# Patient Record
Sex: Male | Born: 1985 | Race: Black or African American | Hispanic: No | Marital: Married | Smoking: Never smoker
Health system: Southern US, Community
[De-identification: ages and names within clinical notes are randomized; demographics above are authoritative.]

## PROBLEM LIST (undated history)

## (undated) DIAGNOSIS — F419 Anxiety disorder, unspecified: Secondary | ICD-10-CM

## (undated) DIAGNOSIS — U071 COVID-19: Secondary | ICD-10-CM

## (undated) DIAGNOSIS — J9819 Other pulmonary collapse: Secondary | ICD-10-CM

## (undated) DIAGNOSIS — J45909 Unspecified asthma, uncomplicated: Secondary | ICD-10-CM

## (undated) DIAGNOSIS — K219 Gastro-esophageal reflux disease without esophagitis: Secondary | ICD-10-CM

## (undated) DIAGNOSIS — T7840XA Allergy, unspecified, initial encounter: Secondary | ICD-10-CM

## (undated) HISTORY — DX: Allergy, unspecified, initial encounter: T78.40XA

## (undated) HISTORY — DX: Anxiety disorder, unspecified: F41.9

---
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
## 2004-07-08 ENCOUNTER — Emergency Department (HOSPITAL_COMMUNITY): Admission: EM | Admit: 2004-07-08 | Discharge: 2004-07-09 | Payer: Self-pay | Admitting: Emergency Medicine

## 2004-08-31 ENCOUNTER — Emergency Department (HOSPITAL_COMMUNITY): Admission: EM | Admit: 2004-08-31 | Discharge: 2004-08-31 | Payer: Self-pay | Admitting: Emergency Medicine

## 2007-04-01 ENCOUNTER — Emergency Department (HOSPITAL_COMMUNITY): Admission: EM | Admit: 2007-04-01 | Discharge: 2007-04-01 | Payer: Self-pay | Admitting: Emergency Medicine

## 2008-08-11 ENCOUNTER — Encounter: Payer: Self-pay | Admitting: Family Medicine

## 2008-09-01 ENCOUNTER — Ambulatory Visit: Payer: Self-pay | Admitting: Family Medicine

## 2009-04-09 ENCOUNTER — Ambulatory Visit: Payer: Self-pay | Admitting: Family Medicine

## 2009-04-09 DIAGNOSIS — R300 Dysuria: Secondary | ICD-10-CM | POA: Insufficient documentation

## 2009-04-09 LAB — CONVERTED CEMR LAB
Bilirubin Urine: NEGATIVE
Glucose, Urine, Semiquant: NEGATIVE
Protein, U semiquant: NEGATIVE
Urobilinogen, UA: 0.2
WBC Urine, dipstick: NEGATIVE

## 2009-04-13 ENCOUNTER — Encounter: Payer: Self-pay | Admitting: Family Medicine

## 2009-04-13 LAB — CONVERTED CEMR LAB: Chlamydia, Swab/Urine, PCR: NEGATIVE

## 2010-05-03 ENCOUNTER — Emergency Department (HOSPITAL_COMMUNITY)
Admission: EM | Admit: 2010-05-03 | Discharge: 2010-05-03 | Payer: Self-pay | Source: Home / Self Care | Admitting: Emergency Medicine

## 2012-02-28 ENCOUNTER — Encounter (HOSPITAL_COMMUNITY): Payer: Self-pay | Admitting: Emergency Medicine

## 2012-02-28 ENCOUNTER — Emergency Department (HOSPITAL_COMMUNITY)
Admission: EM | Admit: 2012-02-28 | Discharge: 2012-02-28 | Disposition: A | Discharge status: Home / Self Care | Payer: Self-pay | Attending: Emergency Medicine | Admitting: Emergency Medicine

## 2012-02-28 ENCOUNTER — Emergency Department (HOSPITAL_COMMUNITY): Payer: Self-pay

## 2012-02-28 DIAGNOSIS — R0789 Other chest pain: Secondary | ICD-10-CM | POA: Insufficient documentation

## 2012-02-28 DIAGNOSIS — J45909 Unspecified asthma, uncomplicated: Secondary | ICD-10-CM | POA: Insufficient documentation

## 2012-02-28 DIAGNOSIS — M25519 Pain in unspecified shoulder: Secondary | ICD-10-CM | POA: Insufficient documentation

## 2012-02-28 HISTORY — DX: Unspecified asthma, uncomplicated: J45.909

## 2012-02-28 LAB — BASIC METABOLIC PANEL
BUN: 14 mg/dL (ref 6–23)
CO2: 25 mEq/L (ref 19–32)
Chloride: 99 mEq/L (ref 96–112)
Creatinine, Ser: 1.1 mg/dL (ref 0.50–1.35)
Glucose, Bld: 91 mg/dL (ref 70–99)
Potassium: 4 mEq/L (ref 3.5–5.1)

## 2012-02-28 LAB — CBC
HCT: 43.2 % (ref 39.0–52.0)
Hemoglobin: 16 g/dL (ref 13.0–17.0)
MCV: 83.1 fL (ref 78.0–100.0)
WBC: 4.4 10*3/uL (ref 4.0–10.5)

## 2012-02-28 MED ORDER — IBUPROFEN 400 MG PO TABS
600.0000 mg | ORAL_TABLET | Freq: Once | ORAL | Status: AC
  Administered 2012-02-28: 600 mg via ORAL
  Filled 2012-02-28: qty 1

## 2012-02-28 NOTE — ED Notes (Signed)
Pt. Reports having  A dull ache to his lt. Side of his chest with intermittent sharp spasms.  Pt. Also reports feeling like he can not take a full breath.

## 2012-02-28 NOTE — ED Notes (Signed)
ED MP at bedside.

## 2012-02-28 NOTE — ED Provider Notes (Signed)
Medical screening examination/treatment/procedure(s) were performed by non-physician practitioner and as supervising physician I was immediately available for consultation/collaboration.  Yesenia Locurto, MD 02/28/12 1919 

## 2012-02-28 NOTE — ED Provider Notes (Signed)
History     CSN: [id number]  Arrival date & time 02/28/12  0935   None     Chief Complaint  Patient presents with  . Chest Pain    (Consider location/radiation/quality/duration/timing/severity/associated sxs/prior treatment) Patient is a 26 y.o. male presenting with chest pain. No language interpreter was used.  Chest Pain The chest pain began 3 - 5 days ago. Chest pain occurs intermittently. The chest pain is worsening. The pain is associated with coughing. At its most intense, the pain is at 8/10. The severity of the pain is moderate. The quality of the pain is described as dull and sharp. The pain radiates to the left shoulder. Chest pain is worsened by exertion. Primary symptoms include fatigue and cough. Pertinent negatives for primary symptoms include no fever, no syncope, no shortness of breath, no palpitations, no nausea and no vomiting. He tried nothing for the symptoms. Risk factors include male gender.  Pertinent negatives for past medical history include no CAD, no COPD, no CHF, no DVT, no MI and no PE.    26 year old male coming in with intermittent left chest shoulder pain x4 days. States that the pain comes on 3/4 times a day and last about an hour. States that the pain is dull with sharp interactions. The pain is upper left chest and under her left arm and sometimes in his left shoulder. States that he did have a cold with a cough about a week ago. Also said that he works out sometimes but the last time was 2 months ago. Patient perks negative. No long trips no calf pain. The pain with palpitation. Patient does not smoke.No Family history of his grandmother having open-heart surgery in her 45s. Denies high cholesterol. Past medical history of asthma only.  Past Medical History  Diagnosis Date  . Asthma     No past surgical history on file.  No family history on file.  History  Substance Use Topics  . Smoking status: Never Smoker   . Smokeless tobacco: Not on file  .  Alcohol Use: Yes      Review of Systems  Constitutional: Positive for fatigue. Negative for fever.  HENT: Positive for rhinorrhea.   Eyes: Negative.   Respiratory: Positive for cough. Negative for shortness of breath.   Cardiovascular: Positive for chest pain. Negative for palpitations and syncope.  Gastrointestinal: Negative.  Negative for nausea and vomiting.  Neurological: Negative.   Psychiatric/Behavioral: Negative.   All other systems reviewed and are negative.    Allergies  Review of patient's allergies indicates no known allergies.  Home Medications   Current Outpatient Rx  Name Route Sig Dispense Refill  . ADULT MULTIVITAMIN W/MINERALS CH Oral Take 1 tablet by mouth daily.      BP 141/81  Pulse 95  Temp 98.2 F (36.8 C)  Resp 16  SpO2 100%  Physical Exam  Nursing note and vitals reviewed. Constitutional: He is oriented to person, place, and time. He appears well-developed and well-nourished.  HENT:  Head: Normocephalic.  Eyes: Conjunctivae normal and EOM are normal. Pupils are equal, round, and reactive to light.  Neck: Normal range of motion. Neck supple.  Cardiovascular: Normal rate.   Pulmonary/Chest: Effort normal and breath sounds normal. No respiratory distress.  Abdominal: Soft. Bowel sounds are normal.  Musculoskeletal: Normal range of motion.  Neurological: He is alert and oriented to person, place, and time.  Skin: Skin is warm and dry.  Psychiatric: He has a normal mood and affect.  ED Course  Procedures (including critical care time) Patient will move to CDU. Report giving to [facility]. No acute distress. No chest pain presently.  Patient will be ruled out for MI in the CDU unit. We will treat with anti-inflammatories if he rules out.    Labs Reviewed  POCT I-STAT TROPONIN I  CBC  BASIC METABOLIC PANEL   No results found.   No diagnosis found.    MDM  26 year old with atypical left chest pain intermittent x4 days. Will move  to CDU awaiting results to rule out MI.  Patient perked negative.         Remi Haggard, NP 02/28/12 1033   Date: 02/28/2012  Rate: 92  Rhythm: normal sinus rhythm  QRS Axis: normal  Intervals: normal  ST/T Wave abnormalities: normal  Conduction Disutrbances:none  Narrative Interpretation:   Old EKG Reviewed: none available    Remi Haggard, NP 02/28/12 1707

## 2012-02-28 NOTE — ED Provider Notes (Signed)
Medical screening examination/treatment/procedure(s) were performed by non-physician practitioner and as supervising physician I was immediately available for consultation/collaboration.  Derwood Kaplan, MD 02/28/12 1919

## 2012-02-28 NOTE — ED Provider Notes (Signed)
Patient placed in the CDU for cardiac work-up to be completed. PERC negative. Troponin is negative and EKG is normal. pts pain is most likely musculoskeletal.  Will dc with Ibuprofen and PCP follow-up.  Dx: musculoskeletal chest pains  Dorthula Matas, PA 02/28/12 1142

## 2012-02-28 NOTE — ED Notes (Signed)
Cp x 2 days ago sharp and stabbing and persistant than dull he states  No n/v/cough pain is in center of chest and under left arm

## 2014-08-13 ENCOUNTER — Emergency Department (HOSPITAL_COMMUNITY): Payer: Self-pay

## 2014-08-13 ENCOUNTER — Emergency Department (HOSPITAL_COMMUNITY)
Admission: EM | Admit: 2014-08-13 | Discharge: 2014-08-13 | Disposition: A | Discharge status: Home / Self Care | Payer: Self-pay | Attending: Emergency Medicine | Admitting: Emergency Medicine

## 2014-08-13 ENCOUNTER — Encounter (HOSPITAL_COMMUNITY): Payer: Self-pay | Admitting: Emergency Medicine

## 2014-08-13 DIAGNOSIS — R079 Chest pain, unspecified: Secondary | ICD-10-CM

## 2014-08-13 DIAGNOSIS — J45901 Unspecified asthma with (acute) exacerbation: Secondary | ICD-10-CM | POA: Insufficient documentation

## 2014-08-13 DIAGNOSIS — R42 Dizziness and giddiness: Secondary | ICD-10-CM | POA: Insufficient documentation

## 2014-08-13 DIAGNOSIS — R61 Generalized hyperhidrosis: Secondary | ICD-10-CM | POA: Insufficient documentation

## 2014-08-13 DIAGNOSIS — R0789 Other chest pain: Secondary | ICD-10-CM | POA: Insufficient documentation

## 2014-08-13 DIAGNOSIS — R531 Weakness: Secondary | ICD-10-CM | POA: Insufficient documentation

## 2014-08-13 LAB — BASIC METABOLIC PANEL
Anion gap: 7 (ref 5–15)
BUN: 12 mg/dL (ref 6–23)
CALCIUM: 9 mg/dL (ref 8.4–10.5)
CO2: 27 mmol/L (ref 19–32)
CREATININE: 1.17 mg/dL (ref 0.50–1.35)
Chloride: 104 mmol/L (ref 96–112)
GFR calc Af Amer: >90 mL/min (ref >90)
GFR calc non Af Amer: 83 mL/min — ABNORMAL LOW (ref >90)
GLUCOSE: 84 mg/dL (ref 70–99)
Potassium: 4.1 mmol/L (ref 3.5–5.1)
SODIUM: 138 mmol/L (ref 135–145)

## 2014-08-13 LAB — URINALYSIS, ROUTINE W REFLEX MICROSCOPIC
BILIRUBIN URINE: NEGATIVE
GLUCOSE, UA: NEGATIVE mg/dL
HGB URINE DIPSTICK: NEGATIVE
KETONES UR: NEGATIVE mg/dL
Leukocytes, UA: NEGATIVE
Nitrite: NEGATIVE
PROTEIN: NEGATIVE mg/dL
Specific Gravity, Urine: 1.008 (ref 1.005–1.030)
UROBILINOGEN UA: 0.2 mg/dL (ref 0.0–1.0)
pH: 6.5 (ref 5.0–8.0)

## 2014-08-13 LAB — I-STAT TROPONIN, ED
TROPONIN I, POC: 0 ng/mL (ref 0.00–0.08)
Troponin i, poc: 0 ng/mL (ref 0.00–0.08)

## 2014-08-13 LAB — D-DIMER, QUANTITATIVE (NOT AT ARMC): D DIMER QUANT: 0.27 ug{FEU}/mL (ref 0.00–0.48)

## 2014-08-13 LAB — CBC
HCT: 42.9 % (ref 39.0–52.0)
Hemoglobin: 15.1 g/dL (ref 13.0–17.0)
MCH: 30.7 pg (ref 26.0–34.0)
MCHC: 35.2 g/dL (ref 30.0–36.0)
MCV: 87.2 fL (ref 78.0–100.0)
PLATELETS: 215 10*3/uL (ref 150–400)
RBC: 4.92 MIL/uL (ref 4.22–5.81)
RDW: 11.7 % (ref 11.5–15.5)
WBC: 4.3 10*3/uL (ref 4.0–10.5)

## 2014-08-13 NOTE — ED Notes (Signed)
[facility] [employer], CP, weakness pta, no EKG changes, 18g LAC, VSS, A/O X4, ambulatory and in NAD

## 2014-08-13 NOTE — Discharge Instructions (Signed)
Chest Pain (Nonspecific) °It is often hard to give a specific diagnosis for the cause of chest pain. There is always a chance that your pain could be related to something serious, such as a heart attack or a blood clot in the lungs. You need to follow up with your health care provider for further evaluation. °CAUSES  °· Heartburn. °· Pneumonia or bronchitis. °· Anxiety or stress. °· Inflammation around your heart (pericarditis) or lung (pleuritis or pleurisy). °· A blood clot in the lung. °· A collapsed lung (pneumothorax). It can develop suddenly on its own (spontaneous pneumothorax) or from trauma to the chest. °· Shingles infection (herpes zoster virus). °The chest wall is composed of bones, muscles, and cartilage. Any of these can be the source of the pain. °· The bones can be bruised by injury. °· The muscles or cartilage can be strained by coughing or overwork. °· The cartilage can be affected by inflammation and become sore (costochondritis). °DIAGNOSIS  °Lab tests or other studies may be needed to find the cause of your pain. Your health care provider may have you take a test called an ambulatory electrocardiogram (ECG). An ECG records your heartbeat patterns over a 24-hour period. You may also have other tests, such as: °· Transthoracic echocardiogram (TTE). During echocardiography, sound waves are used to evaluate how blood flows through your heart. °· Transesophageal echocardiogram (TEE). °· Cardiac monitoring. This allows your health care provider to monitor your heart rate and rhythm in real time. °· Holter monitor. This is a portable device that records your heartbeat and can help diagnose heart arrhythmias. It allows your health care provider to track your heart activity for several days, if needed. °· Stress tests by exercise or by giving medicine that makes the heart beat faster. °TREATMENT  °· Treatment depends on what may be causing your chest pain. Treatment may include: °¨ Acid blockers for  heartburn. °¨ Anti-inflammatory medicine. °¨ Pain medicine for inflammatory conditions. °¨ Antibiotics if an infection is present. °· You may be advised to change lifestyle habits. This includes stopping smoking and avoiding alcohol, caffeine, and chocolate. °· You may be advised to keep your head raised (elevated) when sleeping. This reduces the chance of acid going backward from your stomach into your esophagus. °Most of the time, nonspecific chest pain will improve within 2-3 days with rest and mild pain medicine.  °HOME CARE INSTRUCTIONS  °· If antibiotics were prescribed, take them as directed. Finish them even if you start to feel better. °· For the next few days, avoid physical activities that bring on chest pain. Continue physical activities as directed. °· Do not use any tobacco products, including cigarettes, chewing tobacco, or electronic cigarettes. °· Avoid drinking alcohol. °· Only take medicine as directed by your health care provider. °· Follow your health care provider's suggestions for further testing if your chest pain does not go away. °· Keep any follow-up appointments you made. If you do not go to an appointment, you could develop lasting (chronic) problems with pain. If there is any problem keeping an appointment, call to reschedule. °SEEK MEDICAL CARE IF:  °· Your chest pain does not go away, even after treatment. °· You have a rash with blisters on your chest. °· You have a fever. °SEEK IMMEDIATE MEDICAL CARE IF:  °· You have increased chest pain or pain that spreads to your arm, neck, jaw, back, or abdomen. °· You have shortness of breath. °· You have an increasing cough, or you cough   up blood. °· You have severe back or abdominal pain. °· You feel nauseous or vomit. °· You have severe weakness. °· You faint. °· You have chills. °This is an emergency. Do not wait to see if the pain will go away. Get medical help at once. Call your local emergency services (911 in [place] Do not drive  yourself to the hospital. °MAKE SURE YOU:  °· Understand these instructions. °· Will watch your condition. °· Will get help right away if you are not doing well or get worse. °Document Released: 02/15/2005 Document Revised: 05/13/2013 Document Reviewed: 12/12/2007 °ExitCare® Patient Information ©2015 [employer]. This information is not intended to replace advice given to you by your health care provider. Make sure you discuss any questions you have with your health care provider. ° ° °Emergency Department Resource Guide °1) Find a Doctor and Pay Out of Pocket °Although you won't have to find out who is covered by your insurance plan, it is a good idea to ask around and get recommendations. You will then need to call the office and see if the doctor you have chosen will accept you as a new patient and what types of options they offer for patients who are self-pay. Some doctors offer discounts or will set up payment plans for their patients who do not have insurance, but you will need to ask so you aren't surprised when you get to your appointment. ° °2) Contact Your Local Health Department °Not all health departments have doctors that can see patients for sick visits, but many do, so it is worth a call to see if yours does. If you don't know where your local health department is, you can check in your phone book. The CDC also has a tool to help you locate your state's health department, and many state websites also have listings of all of their local health departments. ° °3) Find a Walk-in Clinic °If your illness is not likely to be very severe or complicated, you may want to try a walk in clinic. These are popping up all over the country in pharmacies, drugstores, and shopping centers. They're usually staffed by nurse practitioners or physician assistants that have been trained to treat common illnesses and complaints. They're usually fairly quick and inexpensive. However, if you have serious medical issues or  chronic medical problems, these are probably not your best option. ° °No Primary Care Doctor: °- Call Health Connect at  832-8000 - they can help you locate a primary care doctor that  accepts your insurance, provides certain services, etc. °- Physician Referral Service- 1-800-533-3463 ° °Chronic Pain Problems: °Organization         Address  Phone   Notes  °[facility]  (336) 297-2271 Patients need to be referred by their primary care doctor.  ° °Medication Assistance: °Organization         Address  Phone   Notes  °[place] [facility] [address] °[place], [place] [address] (336) 641-8030 --Must be a resident of [place] °-- Must have NO insurance coverage whatsoever (no Medicaid/ Medicare, etc.) °-- The pt. MUST have a primary care doctor that directs their care regularly and follows them in the community °  °[facility]  (866) 331-1348   °[employer]  (888) 892-1162   ° °Agencies that provide inexpensive medical care: °Organization         Address  Phone   Notes  °[facility]  (  336) 832-8035   °[facility]    (336) 832-7272   °[facility] [address] °[place], [place] [address] (336) 832-4777   °[facility] [address], °[place] (336) 271-4999   °[facility]    (336) 373-0678   °[facility]    (336) 272-1050   °[facility] ° [address], [place] Phone:  (336) 832-4444, Fax:  (336) 832-4440 Hours of Operation:  9 am - 6 pm, M-F.  Also accepts Medicaid/Medicare and self-pay.  °[facility] ° [address], [place] Phone: (336) 832-3150, Fax: (336) [phone]. Hours of Operation:  8:30 am - 5:30 pm, M-F.  Also accepts Medicaid and self-pay.  °[facility] [place] [address], [place] Phone: (336) 878-6027   °[facility] [address], [place], [place]  (336)723-1848, Ext. 123 Mondays & Thursdays: 7-9 AM.  First 15 patients are seen on a first come, first serve basis. °  ° °Medicaid-accepting [place] Providers: ° °Organization         Address  Phone   Notes  °[facility] [address], [place] (336) 641-2100 Also accepts self-pay patients.  °[facility] [address], [place] ° (336) 856-9996   °[facility] [address], [place] (336) 288-8857   °[facility] [address] [place] [address], [place] (336) 299-7000   °Veita Bland [address], [place]  ° (336) 373-1557 Only accepts [place] Access Medicaid patients after they have their name applied to their card.  ° °Self-Pay (no insurance) in [place]: ° °Organization         Address  Phone   Notes  °Sickle Cell Patients, [facility] [address], [place] (336) 832-1970   °[facility] [address], [place] (336) 832-4400   °[facility] [place] ° [address], [place] (336) 992-4800   °[facility]/Dr. Osei-Bonsu ° [address] [place] [address], [place] or [address], [place] (336) 841-8500 Phone number for both [place] and [place] locations is the same.  °[facility] [address], [place] (336) 299-0000   °[facility] [address] [place] [address], [place] or [address] (336) 852-7530 °(336) 878-2260   °[facility] [address], [place] (336) 350-1642, phone; (336) 294-5005, fax Sees patients 1st and 3rd Saturday of every month.  Must not qualify for public or private insurance (i.e. Medicaid, Medicare, [place] Health Choice, Veterans' Benefits) • Household income should be no more than 200% of the poverty level •The clinic cannot treat you if you are pregnant or think you are pregnant • Sexually transmitted  diseases are not treated at the clinic.  ° ° °Dental Care: °Organization         Address  Phone  Notes  °[facility] [facility] [address], [place] (336) 641-6152 Accepts children up to age 21 who are enrolled in Medicaid or [place] Health Choice; pregnant women with a Medicaid card; and children who have applied for Medicaid or [place] Health Choice, but were declined, whose parents can pay a reduced fee at time of service.  °[facility] [place]    [address], [place] (336) 641-7733 Accepts children up to age 21 who are enrolled in Medicaid or [place] Health Choice; pregnant women with a Medicaid card; and children who have applied for Medicaid or [place] Health Choice, but were declined, whose parents can pay a reduced fee at time of service.  °[facility] ° [address], [place] (336) 641-4533 Patients are seen by appointment only. Walk-ins are not accepted. [facility] will see patients 18 years of age and older. °Monday - Tuesday (8am-5pm) °Most Wednesdays (8:30-5pm) °$30 per visit, cash only  °[facility] ° [address], [place] (336) 641-4533 Patients are seen by appointment only. Walk-ins are not accepted. [facility] will see patients 18 years of age and older. °One Wednesday Evening (Monthly: Volunteer Based).  $30 per visit, cash only  °[facility]  (919) 537-3737 for adults; Children under age 4, call Graduate Pediatric Dentistry at (919) [phone]. Children aged 4-14, please call (919) [phone] to request a pediatric application. ° Dental services are provided in all areas of dental care including fillings, crowns and bridges, complete and partial dentures, implants, gum treatment, root canals, and extractions. Preventive care is also provided. Treatment is provided to both adults and children. °Patients are selected via a  lottery and there is often a waiting list. °  °[facility] [address], °[place] ° (336) 763-8833 [email or web address] °  °[facility] [address], [place], [place] (336)723-1848, Ext. 123 Second and Fourth Thursday of each month, opens at 6:30 AM; Clinic ends at 9 AM.  Patients are seen on a first-come first-served basis, and a limited number are seen during each clinic.  ° °[facility] ° [address], [place], [place] (336) 723-7904   Eligibility Requirements °You must have lived in [place], [place], or [place] counties for at least the last three months. °  You cannot be eligible for state or federal sponsored healthcare insurance, including Veterans Administration, Medicaid, or Medicare. °  You generally cannot be eligible for healthcare insurance through your employer.  °  How to apply: °Eligibility screenings are held every Tuesday and Wednesday afternoon from 1:00 pm until 4:00 pm. You do not need an appointment for the interview!  °[facility] [address], [place], [place] 336-631-2330   °[facility]  336-342-8273   °[facility]  336-703-3100   °[facility]  336-570-6415   ° °Behavioral Health Resources in the Community: °Intensive Outpatient Programs °Organization         Address  Phone  Notes  °[facility] [address], [place], [place] 336-878-6098   °[facility] [address], [place], [place] 336-832-9800   °[facility] [address], [place], [place] ° 336-882-2125   °[facility] [address],  °[place],  1-800-853-5163 or 336-641-4981   °Substance Abuse Resources °Organization         Address  Phone  Notes  °[facility]  336-882-2125   °[facility]  336-784-9470   °[facility] [place] [facility]  336-285-9073   °[facility]  336-845-3988   °Residential &  Outpatient Substance Abuse Program  1-800-659-3381   °Psychological Services °Organization         Address  Phone  Notes  °[facility]  336- 832-9600   °  [facility]  336- 378-7881   °[facility] [address], [place] 1-800-853-5163 or 336-641-4981   ° °Mobile Crisis Teams °Organization         Address  Phone  Notes  °[facility], Mobile Crisis Care Unit  1-877-626-1772   °Assertive °[facility] ° [address] [place], [place] 336-834-9664   °Sharon DeEsch [address] °[place] 336-554-5454   ° °Self-Help/Support Groups °Organization         Address  Phone             Notes  °[facility] [place] - variety of support groups  336- 373-1402 Call for more information  °Narcotics Anonymous (NA), [facility] [address], °[place]  2 meetings at this location  ° °Residential Treatment Programs °Organization         Address  Phone  Notes  °[facility] [address],    °[place]  1-866-801-8205   °[facility] ° [address], [place], [place] 704-293-8524   °[facility] [address], [place] 336-845-3988 Admissions: 8am-3pm M-F  °[facility] [address]    °[place], [place] 336-841-1104   °[facility] [address], [place], [place] 336-379-7146   °[facility] [place] [facility] [address]  °[place], [place] 336-285-9073   °[facility] - Intensive Outpatient [address], [place], [place] 336-852-3033   °[facility] [address]  °[place], [place] 1-877-615-2722 or 336-784-9470   °[facility] [address] [place], [place] 336-227-7417 Accepts Medicaid  °[facility] [address]  ° [place] 1-800-659-3381 Substance Abuse/Addiction Treatment  ° °[place] Behavioral Health Resources °Organization          Address  Phone  Notes  °[facility]  (888) 581-9988   °Julie Brannon, PhD [address] [place], [place]   (336) 349-5553 or (336) 951-0000   °[facility]   [address] °[place], [place] (336) 349-4454   °[facility] [address], [place], [place] (336) 342-8316 Insurance/Medicaid/sponsorship through [facility]  °[facility] [address]                                    [place], [place] (336) 342-8316 Therapy/tele-psych/case  °[facility] [address]  ° [place], [place] (336) 349-2233    °Dr. Arfeen  (336) 349-4544   °[facility] [place]  [employer] [place] [facility] 1) [address], [place] °2) [address], [place] °3)  [address], [place] (336) 349-3220 °(336) 342-7768 ° °(336) 342-8140   °[place] [facility] (336) 342-1394 or (336) 342-3537 (After Hours)    ° ° ° °

## 2014-08-13 NOTE — ED Notes (Signed)
 NS given by EMS

## 2014-08-13 NOTE — ED Provider Notes (Signed)
CSN: [id number]     Arrival date & time 08/13/14  1431 History  This chart was scribed for non-physician practitioner, Fayrene HelperBowie Thresea Doble, PA-C working with Lorre NickAnthony Allen, MD by Angelene GiovanniEmmanuella Mensah, ED Scribe. The patient was seen in room TR09C/TR09C and the patient's care was started at 5:41 PM    Chief Complaint  Patient presents with  . Chest Pain   The history is provided by the patient. No language interpreter was used.   HPI Comments: Timothy Horn is a 29 y.o. male with a hx of asthma who presents to the Emergency Department complaining of sharp pleuritic CP in the mid chest, acute onset PTA. 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 He reports associated lightheadedness, weakness, diaphoresis, and SOB. He denies coughing of blood, abdominal pain, or N/V/D. He reports that he goes to the gym regularly but he has not been lifting anything heavy or out of the ordinary. He denies any current pain. He denies being a smoker but reports being an occasional drinker. He denies a family hx of any heart problems. He denies a hx of PE or DVT. He reports that he went to [place] 2 weeks ago. He reports that he has heart burn when he eats certain foods, however this pain is different. He states that he was given an IV by EMS but no other specific treatment.  Pt wants to have his kidneys checked because he has left sided back pain which is achy and constant and he has noticed a gradually worsening onset of bubbles when he urinates. These symptoms have been ongoing for about one year.     Past Medical History  Diagnosis Date  . Asthma    History reviewed. No pertinent past surgical history. No family history on file. History  Substance Use Topics  . Smoking status: Never Smoker   . Smokeless tobacco: Not on file  . Alcohol Use: Yes    Review of Systems  Constitutional: Positive for diaphoresis. Negative  for fever and chills.  Respiratory: Positive for shortness of breath.   Cardiovascular: Positive for chest pain.  Gastrointestinal: Negative for nausea, vomiting, abdominal pain and diarrhea.  Musculoskeletal: Positive for back pain.  Neurological: Positive for weakness and light-headedness.  All other systems reviewed and are negative.     Allergies  Review of patient's allergies indicates no known allergies.  Home Medications   Prior to Admission medications   Medication Sig Start Date End Date Taking? Authorizing Provider  Multiple Vitamin (MULTIVITAMIN WITH MINERALS) TABS Take 1 tablet by mouth daily.    Historical Provider, MD   BP 122/81 mmHg  Pulse 75  Temp(Src) 98.5 F (36.9 C) (Oral)  Resp 18  Ht 5\' 9"  (1.753 m)  Wt 175 lb (79.379 kg)  BMI 25.83 kg/m2  SpO2 100% Physical Exam  Constitutional: He is oriented to person, place, and time. He appears well-developed and well-nourished. No distress.  HENT:  Head: Normocephalic and atraumatic.  Eyes: Conjunctivae and EOM are normal.  Neck: Neck supple. No tracheal deviation present.  Cardiovascular: Normal rate.   Pulmonary/Chest: Effort normal. No respiratory distress. He exhibits no crepitus.  Chest wall tenderness to palpation. No overlying skin changes. No crepitus or emphysema.   Abdominal: Soft. There is no tenderness.  Genitourinary:  Left CVA tenderness  Musculoskeletal: Normal range of motion.  BLE: no palpable cords, erythema, edema, neg Homan  sign.  Neurological: He is alert and oriented to person, place, and time.  Skin: Skin is warm and dry.  Psychiatric: He has a normal mood and affect. His behavior is normal.  Nursing note and vitals reviewed.   ED Course  Procedures (including critical care time) DIAGNOSTIC STUDIES: Oxygen Saturation is 100% on RA, normal by my interpretation.    COORDINATION OF CARE: 5:48 PM-  Will obtain D-dimer, repeat a Troponin and check UA on pt. Pt advised of plan for  treatment and pt agrees.   6:38 PM Pt with a HEART score of 1, low risk of MACE from ACS.  Also negative d-dimer, doubt PE.  Work up unremarkable, no acute emergent medical condition identified.  Pt is sxs free.  Recommend close f/u with PCP for further care.  He does have chest wall pain on exam.  Recommend NSAIDS as needed.  Strict return precaution given.   Pt also concern of kidney problem.  Normal renal function and UA unremarkable.  Pt to f/u with pcp for further care.    Labs Review Labs Reviewed  BASIC METABOLIC PANEL - Abnormal; Notable for the following:    GFR calc non Af Amer 83 (*)    All other components within normal limits  CBC  URINALYSIS, ROUTINE W REFLEX MICROSCOPIC  D-DIMER, QUANTITATIVE  I-STAT TROPOININ, ED  Rosezena Sensor, ED    Imaging Review Dg Chest 2 View  08/13/2014   CLINICAL DATA:  Chest pain, dizziness  EXAM: CHEST  2 VIEW  COMPARISON:  02/28/2012  FINDINGS: Cardiomediastinal silhouette is stable. No acute infiltrate or pleural effusion. No pulmonary edema. Bony thorax is unremarkable  IMPRESSION: No active cardiopulmonary disease.   Electronically Signed   By: Natasha Mead M.D.   On: 08/13/2014 16:00     EKG Interpretation None      Date: 08/13/2014  Rate: 69  Rhythm: normal sinus rhythm  QRS Axis: normal  Intervals: normal  ST/T Wave abnormalities: normal  Conduction Disutrbances: none  Narrative Interpretation:   Old EKG Reviewed: none for comparison EKG reviewed by me.     MDM   Final diagnoses:  Chest pain, unspecified chest pain type   BP 107/67 mmHg  Pulse 78  Temp(Src) 98.5 F (36.9 C) (Oral)  Resp 18  Ht  (1.753 m)  Wt 175 lb (79.379 kg)  BMI 25.83 kg/m2  SpO2 100%  I have reviewed nursing notes and vital signs. I personally reviewed the imaging tests through PACS system  I reviewed available ER/hospitalization records thought the EMR  I personally performed the services described in this documentation, which was  scribed in my presence. The recorded information has been reviewed and is accurate.     Fayrene Helper, PA-C 08/13/14 1844  Lorre Nick, MD 08/16/14 [phone]

## 2014-08-13 NOTE — ED Notes (Signed)
Pt A&OX4, ambulatory at d/c with steady gait, NAD 

## 2014-11-27 ENCOUNTER — Ambulatory Visit (INDEPENDENT_AMBULATORY_CARE_PROVIDER_SITE_OTHER): Payer: Self-pay | Admitting: Emergency Medicine

## 2014-11-27 VITALS — BP 106/76 | HR 100 | Temp 98.2°F | Resp 18 | Ht 68.0 in | Wt 177.2 lb

## 2014-11-27 DIAGNOSIS — F410 Panic disorder [episodic paroxysmal anxiety] without agoraphobia: Secondary | ICD-10-CM

## 2014-11-27 MED ORDER — LORAZEPAM 1 MG PO TABS
1.0000 mg | ORAL_TABLET | Freq: Three times a day (TID) | ORAL | Status: DC | PRN
Start: 2014-11-27 — End: 2015-08-13

## 2014-11-27 MED ORDER — PAROXETINE HCL 20 MG PO TABS: 20.0000 mg | ORAL_TABLET | Freq: Every day | ORAL | Status: DC

## 2014-11-27 NOTE — Progress Notes (Signed)
Subjective:  Patient ID: Timothy Horn, male    DOB: 1986/02/10  Age: 29 y.o. MRN: [id number]  CC: Panic Attack; Anxiety; Nausea; and Depression   HPI [name] presents  with anxiety and depression. He is undergoing a lot of life stresses he's recently got married he has had a child his wife is not working that is putting a unusual stress on the family. Prior to the delivery his wife was ill he was forced quit one of his 2 jobs. He's been struggling with anxiety for years and is finally gotten out of control and he is having panic attacks. He's doing what's reasonable for panic disorder and anxiety he is going to the gym he is seeking counsel from family members and is reading his Bible. They've encouraged him to come in and get medication and also to seek counseling. He denies any suicidal thoughts or thoughts of self-harm or harm to others. He is not abusing alcohol or drugs.  History Adal has a past medical history of Asthma and Anxiety.   He has no past surgical history on file.   His  family history includes Hyperlipidemia in his maternal grandmother; Mental illness in his sister.  He   reports that he has never smoked. He does not have any smokeless tobacco history on file. He reports that he drinks alcohol. His drug history is not on file.  Outpatient Prescriptions Prior to Visit  Medication Sig Dispense Refill  . Multiple Vitamin (MULTIVITAMIN WITH MINERALS) TABS Take 1 tablet by mouth daily.     No facility-administered medications prior to visit.    History   Social History  . Marital Status: Single    Spouse Name: N/A  . Number of Children: N/A  . Years of Education: N/A   Social History Main Topics  . Smoking status: Never Smoker   . Smokeless tobacco: Not on file  . Alcohol Use: Yes  . Drug Use: Not on file  . Sexual Activity: Not on file   Other Topics Concern  . None   Social History Narrative     Review of Systems  Objective:  BP  106/76 mmHg  Pulse 100  Temp(Src) 98.2 F (36.8 C) (Oral)  Resp 18  Ht  (1.727 m)  Wt 177 lb 3.2 oz (80.377 kg)  BMI 26.95 kg/m2  SpO2 99%  Physical Exam    Assessment & Plan:   Ramces was seen today for panic attack, anxiety, nausea and depression.  Diagnoses and all orders for this visit:  Panic anxiety syndrome Orders: -     Ambulatory referral to Psychology  Other orders -     PARoxetine (PAXIL) 20 MG tablet; Take 1 tablet (20 mg total) by mouth daily. -     LORazepam (ATIVAN) 1 MG tablet; Take 1 tablet (1 mg total) by mouth every 8 (eight) hours as needed for anxiety.   I am having Mr. Lech start on PARoxetine and LORazepam. I am also having him maintain his multivitamin with minerals.  Meds ordered this encounter  Medications  . PARoxetine (PAXIL) 20 MG tablet    Sig: Take 1 tablet (20 mg total) by mouth daily.    Dispense:  30 tablet    Refill:  5  . LORazepam (ATIVAN) 1 MG tablet    Sig: Take 1 tablet (1 mg total) by mouth every 8 (eight) hours as needed for anxiety.    Dispense:  90 tablet    Refill:  0  He was referred to psychologist and put on medication a follow-up month.  Appropriate red flag conditions were discussed with the patient as well as actions that should be taken.  Patient expressed his understanding.  Follow-up: Return if symptoms worsen or fail to improve.  Carmelina DaneAnderson, [place] S, MD

## 2014-11-27 NOTE — Patient Instructions (Signed)

## 2014-11-28 ENCOUNTER — Telehealth: Payer: Self-pay

## 2014-11-28 NOTE — Telephone Encounter (Signed)
Patient has questions regarding medication (Adivan,Paxil) prescribed by Dr Dareen PianoAnderson at our office yesterday. Patient feels he maybe having some side effects from them (nausea,dizzy,jitters,drymouth,loss appetite). Patients call back number is [phone]

## 2014-11-29 ENCOUNTER — Other Ambulatory Visit: Payer: Self-pay | Admitting: Emergency Medicine

## 2014-11-29 MED ORDER — SERTRALINE HCL 50 MG PO TABS
50.0000 mg | ORAL_TABLET | Freq: Every day | ORAL | Status: DC
Start: 2014-11-29 — End: 2018-07-02

## 2014-11-29 NOTE — Telephone Encounter (Signed)
Pt called again. He has been vomiting every day. Please see previous message.

## 2014-11-30 ENCOUNTER — Encounter (HOSPITAL_COMMUNITY): Payer: Self-pay

## 2014-11-30 ENCOUNTER — Emergency Department (HOSPITAL_COMMUNITY)
Admission: EM | Admit: 2014-11-30 | Discharge: 2014-11-30 | Disposition: A | Discharge status: Home / Self Care | Payer: Medicaid Other | Attending: Emergency Medicine | Admitting: Emergency Medicine

## 2014-11-30 DIAGNOSIS — R112 Nausea with vomiting, unspecified: Secondary | ICD-10-CM | POA: Diagnosis present

## 2014-11-30 DIAGNOSIS — F419 Anxiety disorder, unspecified: Secondary | ICD-10-CM | POA: Insufficient documentation

## 2014-11-30 DIAGNOSIS — J45909 Unspecified asthma, uncomplicated: Secondary | ICD-10-CM | POA: Diagnosis not present

## 2014-11-30 DIAGNOSIS — Z79899 Other long term (current) drug therapy: Secondary | ICD-10-CM | POA: Diagnosis not present

## 2014-11-30 DIAGNOSIS — Z7982 Long term (current) use of aspirin: Secondary | ICD-10-CM | POA: Insufficient documentation

## 2014-11-30 LAB — BASIC METABOLIC PANEL
ANION GAP: 12 (ref 5–15)
BUN: 9 mg/dL (ref 6–20)
CALCIUM: 9.6 mg/dL (ref 8.9–10.3)
CHLORIDE: 99 mmol/L — AB (ref 101–111)
CO2: 25 mmol/L (ref 22–32)
Creatinine, Ser: 1.04 mg/dL (ref 0.61–1.24)
GFR calc Af Amer: >60 mL/min (ref >60)
GLUCOSE: 101 mg/dL — AB (ref 65–99)
POTASSIUM: 4.1 mmol/L (ref 3.5–5.1)
Sodium: 136 mmol/L (ref 135–145)

## 2014-11-30 LAB — CBC
HEMATOCRIT: 44.3 % (ref 39.0–52.0)
Hemoglobin: 16.1 g/dL (ref 13.0–17.0)
MCH: 30.6 pg (ref 26.0–34.0)
MCHC: 36.3 g/dL — AB (ref 30.0–36.0)
MCV: 84.2 fL (ref 78.0–100.0)
PLATELETS: 270 10*3/uL (ref 150–400)
RBC: 5.26 MIL/uL (ref 4.22–5.81)
RDW: 11.5 % (ref 11.5–15.5)
WBC: 6.2 10*3/uL (ref 4.0–10.5)

## 2014-11-30 LAB — I-STAT TROPONIN, ED: Troponin i, poc: 0 ng/mL (ref 0.00–0.08)

## 2014-11-30 MED ORDER — OMEPRAZOLE 20 MG PO CPDR: 20.0000 mg | DELAYED_RELEASE_CAPSULE | Freq: Every day | ORAL | Status: DC

## 2014-11-30 MED ORDER — PROMETHAZINE HCL 25 MG/ML IJ SOLN
12.5000 mg | Freq: Once | INTRAMUSCULAR | Status: AC
  Administered 2014-11-30: 12.5 mg via INTRAVENOUS
  Filled 2014-11-30: qty 1

## 2014-11-30 MED ORDER — PROMETHAZINE HCL 25 MG PO TABS: 25.0000 mg | ORAL_TABLET | Freq: Four times a day (QID) | ORAL | Status: DC | PRN

## 2014-11-30 MED ORDER — SODIUM CHLORIDE 0.9 % IV BOLUS (SEPSIS)
1000.0000 mL | Freq: Once | INTRAVENOUS | Status: AC
  Administered 2014-11-30: 1000 mL via INTRAVENOUS

## 2014-11-30 NOTE — Progress Notes (Signed)
CM spoke with pt who [place] with [facility]rey [facility] Pt refused [facility] referral at this time   CM discussed and provided written information for uninsured accepting pcps, discussed the importance of pcp vs EDP services for f/u care, www.needymeds.org, www.goodrx.com, discounted pharmacies and other [place] resources such as [facility] , [facility], affordable care act, financial assistance, uninsured dental services, [place] med assist, DSS and  health department  Reviewed resources for [place] uninsured accepting pcps like [facility], family medicine [facility] [address], [facility], [facility], local urgent care centers, [facility], [facility] family practice, general medical clinics, [facility], [facility] urgent care plus others, medication resources, [facility] out patient pharmacies and housing Pt voiced understanding and appreciation of resources provided

## 2014-11-30 NOTE — ED Notes (Addendum)
Pt presents with c/o vomiting that has been going on since Friday. Pt reports that he has been having some issues with anxiety and panic attacks and was prescribed some medication and ever since starting the medication he has been vomiting. Pt reports he has been unable to keep anything down. Pt also c/o chest pain.

## 2014-11-30 NOTE — Discharge Instructions (Signed)

## 2014-11-30 NOTE — Telephone Encounter (Signed)
lmom to cb. 

## 2014-11-30 NOTE — ED Provider Notes (Signed)
CSN: [id number]     Arrival date & time 11/30/14  1156 History   First MD Initiated Contact with Patient 11/30/14 1500     Chief Complaint  Patient presents with  . Emesis  . Chest Pain    HPI Pt was prescribed paxil and ativan on  friday  .   He started having trouble with vomiting on Friday after starting the medicaion.   The symptoms continued throughout the weekend.  He called his doctor and had the paxil changed to zoloft.  His symptoms continue and he feels dehydrated and weak.  He vomited twice today.  He has not been able to eat or drinking anything.  He has some pain in his upper abdomen and feels like he is having acid reflux.  No fevers.  No trouble urinating. Some diarrhea for a couple of days. Past Medical History  Diagnosis Date  . Asthma   . Anxiety    History reviewed. No pertinent past surgical history. Family History  Problem Relation Age of Onset  . Mental illness Sister   . Hyperlipidemia Maternal Grandmother    History  Substance Use Topics  . Smoking status: Never Smoker   . Smokeless tobacco: Not on file  . Alcohol Use: Yes    Review of Systems  Psychiatric/Behavioral: Positive for sleep disturbance.  All other systems reviewed and are negative.     Allergies  Ativan and Paxil  Home Medications   Prior to Admission medications   Medication Sig Start Date End Date Taking? Authorizing Provider  aspirin 81 MG tablet Take 81 mg by mouth 2 (two) times daily as needed for pain.    Yes Historical Provider, MD  calcium carbonate (TUMS EX) 750 MG chewable tablet Chew 1 tablet by mouth 2 (two) times daily as needed for heartburn.   Yes Historical Provider, MD  Multiple Vitamin (MULTIVITAMIN WITH MINERALS) TABS Take 1 tablet by mouth daily.   Yes Historical Provider, MD  LORazepam (ATIVAN) 1 MG tablet Take 1 tablet (1 mg total) by mouth every 8 (eight) hours as needed for anxiety. Patient not taking: Reported on 11/30/2014 11/27/14   Carmelina Dane, MD   omeprazole (PRILOSEC) 20 MG capsule Take 1 capsule (20 mg total) by mouth daily. 11/30/14   Linwood Dibbles, MD  PARoxetine (PAXIL) 20 MG tablet Take 1 tablet (20 mg total) by mouth daily. Patient not taking: Reported on 11/30/2014 11/27/14   Carmelina Dane, MD  promethazine (PHENERGAN) 25 MG tablet Take 1 tablet (25 mg total) by mouth every 6 (six) hours as needed for nausea or vomiting. 11/30/14   Linwood Dibbles, MD  sertraline (ZOLOFT) 50 MG tablet Take 1 tablet (50 mg total) by mouth daily. Patient not taking: Reported on 11/30/2014 11/29/14   Carmelina Dane, MD   BP 128/80 mmHg  Pulse 71  Temp(Src) 97.8 F (36.6 C) (Oral)  Resp 16  SpO2 97% Physical Exam  Constitutional: He appears well-developed and well-nourished. No distress.  HENT:  Head: Normocephalic and atraumatic.  Right Ear: External ear normal.  Left Ear: External ear normal.  Eyes: Conjunctivae are normal. Right eye exhibits no discharge. Left eye exhibits no discharge. No scleral icterus.  Neck: Neck supple. No tracheal deviation present.  Cardiovascular: Normal rate, regular rhythm and intact distal pulses.   Pulmonary/Chest: Effort normal and breath sounds normal. No stridor. No respiratory distress. He has no wheezes. He has no rales.  Abdominal: Soft. Bowel sounds are normal. He exhibits no distension.  There is no tenderness. There is no rebound and no guarding.  Musculoskeletal: He exhibits no edema or tenderness.  Neurological: He is alert. He has normal strength. No cranial nerve deficit (no facial droop, extraocular movements intact, no slurred speech) or sensory deficit. He exhibits normal muscle tone. He displays no seizure activity. Coordination normal.  Skin: Skin is warm and dry. No rash noted.  Psychiatric: He has a normal mood and affect.  Nursing note and vitals reviewed.   ED Course  Procedures (including critical care time) Labs Review Labs Reviewed  BASIC METABOLIC PANEL - Abnormal; Notable for the  following:    Chloride 99 (*)    Glucose, Bld 101 (*)    All other components within normal limits  CBC - Abnormal; Notable for the following:    MCHC 36.3 (*)    All other components within normal limits  I-STAT TROPOININ, ED      EKG Interpretation   Date/Time:  Monday November 30 2014 12:44:31 EDT Ventricular Rate:  72 PR Interval:  141 QRS Duration: 68 QT Interval:  382 QTC Calculation: 418 R Axis:   77 Text Interpretation:  Sinus rhythm No significant change since last  tracing Confirmed by Makya Phillis  MD-J, Nikia Levels ([id number]) on 11/30/2014 3:05:47 PM     Medications  sodium chloride 0.9 % bolus 1,000 mL (1,000 mLs Intravenous New Bag/Given 11/30/14 1541)  promethazine (PHENERGAN) injection 12.5 mg (12.5 mg Intravenous Given 11/30/14 1540)     MDM   Final diagnoses:  Non-intractable vomiting with nausea, vomiting of unspecified type    Patient is not having any vomiting in the emergency department. His laboratory tests are unremarkable. Patient feels his symptoms are a side effect related to his new medications. That is possible versus coincidental however he was changed to a different. It appeared to be acutely dehydrated. He has no abdominal tenderness. He was given IV fluids in emergency department. Plan on discharge home with prescription for Phenergan. Follow-up with his primary care doctor.    Linwood DibblesJon Forrestine Lecrone, MD 11/30/14 [phone]

## 2014-12-02 NOTE — Telephone Encounter (Signed)
Clld pt - LMOVM of cell to cll [facility] regarding his vomiting.

## 2014-12-24 ENCOUNTER — Encounter (HOSPITAL_COMMUNITY): Payer: Self-pay | Admitting: Emergency Medicine

## 2014-12-24 ENCOUNTER — Emergency Department (HOSPITAL_COMMUNITY)
Admission: EM | Admit: 2014-12-24 | Discharge: 2014-12-24 | Disposition: A | Discharge status: Home / Self Care | Payer: Medicaid Other | Source: Home / Self Care | Attending: Family Medicine | Admitting: Family Medicine

## 2014-12-24 DIAGNOSIS — K4090 Unilateral inguinal hernia, without obstruction or gangrene, not specified as recurrent: Secondary | ICD-10-CM

## 2014-12-24 DIAGNOSIS — N5089 Other specified disorders of the male genital organs: Secondary | ICD-10-CM

## 2014-12-24 DIAGNOSIS — H8110 Benign paroxysmal vertigo, unspecified ear: Secondary | ICD-10-CM | POA: Diagnosis not present

## 2014-12-24 DIAGNOSIS — N508 Other specified disorders of male genital organs: Secondary | ICD-10-CM | POA: Diagnosis not present

## 2014-12-24 DIAGNOSIS — G44219 Episodic tension-type headache, not intractable: Secondary | ICD-10-CM | POA: Diagnosis not present

## 2014-12-24 LAB — POCT URINALYSIS DIP (DEVICE)
Bilirubin Urine: NEGATIVE
GLUCOSE, UA: NEGATIVE mg/dL
HGB URINE DIPSTICK: NEGATIVE
Ketones, ur: NEGATIVE mg/dL
Leukocytes, UA: NEGATIVE
NITRITE: NEGATIVE
PROTEIN: NEGATIVE mg/dL
Specific Gravity, Urine: 1.02 (ref 1.005–1.030)
Urobilinogen, UA: 0.2 mg/dL (ref 0.0–1.0)
pH: 7 (ref 5.0–8.0)

## 2014-12-24 MED ORDER — MECLIZINE HCL 25 MG PO TABS: 25.0000 mg | ORAL_TABLET | Freq: Three times a day (TID) | ORAL | Status: DC | PRN

## 2014-12-24 NOTE — ED Provider Notes (Signed)
CSN: [id number]     Arrival date & time 12/24/14  1653 History   First MD Initiated Contact with Patient 12/24/14 1910     Chief Complaint  Patient presents with  . Mass  . Dental Pain  . Groin Pain   (Consider location/radiation/quality/duration/timing/severity/associated sxs/prior Treatment) HPI 29 year old male with several issues:   #1. Head pressure:   Present for the past 1-2 weeks. States he has had occasional symptoms of room spinning when he stands up too quickly, turns his head.   Also describes a squeezing sensation around his head and a bandlike sensation. States he's been having more stress recently. No changes in his vision but he is worried that his vision is not good at baseline. No weakness or numbness otherwise noted. No facial droop or slurring.  #2. Inguinal swelling:   Present for past 3-4 years. Bilateral. Occasionally painful. Worse if he stands up or tries to lift something.   Eating and drinking well. No trouble with bowel movements.  #3. Testicular mass: he has noted these for the past couple weeks. He states they're bilateral. He noticed the same time on both testicles. He has had no pain. No discharge. No dysuria  Past Medical History  Diagnosis Date  . Asthma   . Anxiety    History reviewed. No pertinent past surgical history. Family History  Problem Relation Age of Onset  . Mental illness Sister   . Hyperlipidemia Maternal Grandmother    History  Substance Use Topics  . Smoking status: Never Smoker   . Smokeless tobacco: Not on file  . Alcohol Use: Yes    Review of Systems  As per HPI  Allergies  Ativan and Paxil  Home Medications   Prior to Admission medications   Medication Sig Start Date End Date Taking? Authorizing Provider  aspirin 81 MG tablet Take 81 mg by mouth 2 (two) times daily as needed for pain.     Historical Provider, MD  calcium carbonate (TUMS EX) 750 MG chewable tablet Chew 1 tablet by mouth 2 (two) times daily as needed  for heartburn.    Historical Provider, MD  LORazepam (ATIVAN) 1 MG tablet Take 1 tablet (1 mg total) by mouth every 8 (eight) hours as needed for anxiety. Patient not taking: Reported on 11/30/2014 11/27/14   Carmelina Dane, MD  meclizine (ANTIVERT) 25 MG tablet Take 1 tablet (25 mg total) by mouth 3 (three) times daily as needed for dizziness. 12/24/14   Tobey Grim, MD  Multiple Vitamin (MULTIVITAMIN WITH MINERALS) TABS Take 1 tablet by mouth daily.    Historical Provider, MD  omeprazole (PRILOSEC) 20 MG capsule Take 1 capsule (20 mg total) by mouth daily. 11/30/14   Linwood Dibbles, MD  PARoxetine (PAXIL) 20 MG tablet Take 1 tablet (20 mg total) by mouth daily. Patient not taking: Reported on 11/30/2014 11/27/14   Carmelina Dane, MD  promethazine (PHENERGAN) 25 MG tablet Take 1 tablet (25 mg total) by mouth every 6 (six) hours as needed for nausea or vomiting. 11/30/14   Linwood Dibbles, MD  sertraline (ZOLOFT) 50 MG tablet Take 1 tablet (50 mg total) by mouth daily. Patient not taking: Reported on 11/30/2014 11/29/14   Carmelina Dane, MD   BP 138/79 mmHg  Pulse 76  Temp(Src) 98.4 F (36.9 C) (Oral)  Resp 16  SpO2 97% Physical Exam  Gen:  Alert, cooperative patient who appears stated age in no acute distress.  Vital signs reviewed. HEENT:  [place]/AT.  EOMI, PERRL.  Funduscopy within normal limits bilaterally with clear cup to disc margins  MMM, tonsils non-erythematous, non-edematous.  External ears WNL, Bilateral TM's normal without retraction, redness or bulging.  Neck: No masses or thyromegaly or limitation in range of motion.  No cervical lymphadenopathy. Cardiac:  Regular rate and rhythm without murmur auscultated.  Good S1/S2. Pulm:  Clear to auscultation bilaterally with good air movement.  No wheezes or rales noted.   Abd:  Soft/nondistended/nontender.  Good bowel sounds throughout all four quadrants.  No masses noted.  GU:  Testicular appendices noted bilaterally. Neither "mass " is larger  than half centimeter in size. Nontender. I do note bilateral hernias on exam. These are currently nontender. Neuro:  No focal deficits.     ED Course  Procedures (including critical care time) Labs Review Labs Reviewed  POCT URINALYSIS DIP (DEVICE)    Imaging Review No results found.   MDM   1. Episodic tension-type headache, not intractable   2. Inguinal hernia without obstruction or gangrene, recurrence not specified, unspecified laterality   3. Testicular mass    -  For his headache and head pressure I believe this is likely secondary to stress as a tension headache. He also has history of anxiety. He has no red flags. Funduscopy is good. - he does have symptoms of BPPV. Will prescribe meclizine. - inguinal hernia noted bilaterally. He would like to discuss this with the surgeon. I discussed he will likely need to be referred by his PCP but we can get from [facility] [place] [facility] information. -  I do not note any real mass in his testicles. I do palpate testicular appendages bilaterally. If these grow in the next 4 weeks need to be seen by urologist. I did discuss this with them and expressed understanding.   Tobey Grim, MD 12/24/14 2200

## 2014-12-24 NOTE — ED Notes (Signed)
Here for multiple reason States he has an abscess in his tooth States he has lumps in bilateral testicles States he has groin pain and his bladder is swollen

## 2014-12-24 NOTE — Discharge Instructions (Signed)
We talked about several different things today: -We're headache if you start noticing worsening changes in her vision, trouble with your balance, or numbness and tingling go to the emergency room  - if you notice the bumps in your testicle starting to grow you need to come back and see [place] or go to your primary care physician to see a urologist -I have given you the information to contact [facility] much or hernia. You may need a referral from her primary care doctor to see them. - dizziness you're having is called BPPV.  Meclizine is a medicine that can help with this.   Benign Positional Vertigo Vertigo means you feel like you or your surroundings are moving when they are not. Benign positional vertigo is the most common form of vertigo. Benign means that the cause of your condition is not serious. Benign positional vertigo is more common in older adults. CAUSES  Benign positional vertigo is the result of an upset in the labyrinth system. This is an area in the middle ear that helps control your balance. This may be caused by a viral infection, head injury, or repetitive motion. However, often no specific cause is found. SYMPTOMS  Symptoms of benign positional vertigo occur when you move your head or eyes in different directions. Some of the symptoms may include:  Loss of balance and falls.  Vomiting.  Blurred vision.  Dizziness.  Nausea.  Involuntary eye movements (nystagmus). DIAGNOSIS  Benign positional vertigo is usually diagnosed by physical exam. If the specific cause of your benign positional vertigo is unknown, your caregiver may perform imaging tests, such as magnetic resonance imaging (MRI) or computed tomography (CT). TREATMENT  Your caregiver may recommend movements or procedures to correct the benign positional vertigo. Medicines such as meclizine, benzodiazepines, and medicines for nausea may be used to treat your symptoms. In rare cases, if your symptoms are  caused by certain conditions that affect the inner ear, you may need surgery. HOME CARE INSTRUCTIONS   Follow your caregiver's instructions.  Move slowly. Do not make sudden body or head movements.  Avoid driving.  Avoid operating heavy machinery.  Avoid performing any tasks that would be dangerous to you or others during a vertigo episode.  Drink enough fluids to keep your urine clear or pale yellow. SEEK IMMEDIATE MEDICAL CARE IF:   You develop problems with walking, weakness, numbness, or using your arms, hands, or legs.  You have difficulty speaking.  You develop severe headaches.  Your nausea or vomiting continues or gets worse.  You develop visual changes.  Your family or friends notice any behavioral changes.  Your condition gets worse.  You have a fever.  You develop a stiff neck or sensitivity to light. MAKE SURE YOU:   Understand these instructions.  Will watch your condition.  Will get help right away if you are not doing well or get worse. Document Released: 02/13/2006 Document Revised: 07/31/2011 Document Reviewed: 01/26/2011 [facility] Patient Information 2015 [place], [place]. This information is not intended to replace advice given to you by your health care provider. Make sure you discuss any questions you have with your health care provider.

## 2015-08-13 ENCOUNTER — Encounter (HOSPITAL_COMMUNITY): Payer: Self-pay | Admitting: Emergency Medicine

## 2015-08-13 ENCOUNTER — Emergency Department (HOSPITAL_COMMUNITY)
Admission: EM | Admit: 2015-08-13 | Discharge: 2015-08-13 | Disposition: A | Discharge status: Home / Self Care | Payer: Medicaid Other | Source: Home / Self Care | Attending: Emergency Medicine | Admitting: Emergency Medicine

## 2015-08-13 DIAGNOSIS — J069 Acute upper respiratory infection, unspecified: Secondary | ICD-10-CM

## 2015-08-13 MED ORDER — HYDROCODONE-HOMATROPINE 5-1.5 MG/5ML PO SYRP: 5.0000 mL | ORAL_SOLUTION | Freq: Four times a day (QID) | ORAL | Status: DC | PRN

## 2015-08-13 NOTE — ED Notes (Signed)
Here with URI/ Sinus/ Flu-like sx's that started 2 days ago Body aches, chills, cough mostly at night with yellow phlegm Taking Thera-Flu with some relief Post nasal drip noted as well

## 2015-08-13 NOTE — Discharge Instructions (Signed)
You have an respiratory infection. This is probably not the flu. Make sure you get plenty of rest and drink plenty of fluids. Tylenol or ibuprofen will help the best with the chills and body aches. Use the Hycodan every 4 hours as needed for cough and congestion. Start an over-the-counter allergy medicine such as Zyrtec or Allegra to help with the mucus. You should start to feel better in the next 2-4 days. Follow-up as needed.

## 2015-08-13 NOTE — ED Provider Notes (Signed)
CSN: [id number]     Arrival date & time 08/13/15  1437 History   First MD Initiated Contact with Patient 08/13/15 1608     Chief Complaint  Patient presents with  . URI  . Sinus Problem   (Consider location/radiation/quality/duration/timing/severity/associated sxs/prior Treatment) HPI  He is a 30 year old man here for evaluation of cough and chills.  His symptoms started 3 days ago with a sore throat. He took TheraFlu and things seemed to improve. The next day he developed some chills and cough. Last night, his symptoms worsened and he now reports a lot of mucus and congestion as well as cough. He does continue to have chills and some body aches. Symptoms tend to be worse at night. Has had some nausea, but no vomiting or diarrhea. No known fevers. His wife is sick with similar symptoms.  Past Medical History  Diagnosis Date  . Asthma   . Anxiety    History reviewed. No pertinent past surgical history. Family History  Problem Relation Age of Onset  . Mental illness Sister   . Hyperlipidemia Maternal Grandmother    Social History  Substance Use Topics  . Smoking status: Never Smoker   . Smokeless tobacco: None  . Alcohol Use: Yes    Review of Systems As in history of present illness Allergies  Ativan and Paxil  Home Medications   Prior to Admission medications   Medication Sig Start Date End Date Taking? Authorizing Provider  aspirin 81 MG tablet Take 81 mg by mouth 2 (two) times daily as needed for pain.     Historical Provider, MD  calcium carbonate (TUMS EX) 750 MG chewable tablet Chew 1 tablet by mouth 2 (two) times daily as needed for heartburn.    Historical Provider, MD  HYDROcodone-homatropine (HYCODAN) 5-1.5 MG/5ML syrup Take 5 mLs by mouth every 6 (six) hours as needed for cough. 08/13/15   Charm Rings, MD  meclizine (ANTIVERT) 25 MG tablet Take 1 tablet (25 mg total) by mouth 3 (three) times daily as needed for dizziness. 12/24/14   Tobey Grim, MD  Multiple  Vitamin (MULTIVITAMIN WITH MINERALS) TABS Take 1 tablet by mouth daily.    Historical Provider, MD  omeprazole (PRILOSEC) 20 MG capsule Take 1 capsule (20 mg total) by mouth daily. 11/30/14   Linwood Dibbles, MD  promethazine (PHENERGAN) 25 MG tablet Take 1 tablet (25 mg total) by mouth every 6 (six) hours as needed for nausea or vomiting. 11/30/14   Linwood Dibbles, MD   Meds Ordered and Administered this Visit  Medications - No data to display  BP 130/76 mmHg  Pulse 73  Temp(Src) 97.8 F (36.6 C) (Oral)  SpO2 100% No data found.   Physical Exam  Constitutional: He is oriented to person, place, and time. He appears well-developed and well-nourished. No distress.  HENT:  Mouth/Throat: Oropharynx is clear and moist. No oropharyngeal exudate.  Nasal mucosa is erythematous and boggy  Neck: Neck supple.  Cardiovascular: Normal rate, regular rhythm and normal heart sounds.   No murmur heard. Pulmonary/Chest: Effort normal and breath sounds normal. No respiratory distress. He has no wheezes. He has no rales.  Lymphadenopathy:    He has no cervical adenopathy.  Neurological: He is alert and oriented to person, place, and time.    ED Course  Procedures (including critical care time)  Labs Review Labs Reviewed - No data to display  Imaging Review No results found.    MDM   1. Viral URI  History more consistent with viral illness rather than flu. Symptomatic treatment with rest, fluids, Tylenol/ibuprofen. Prescription given for Hycodan to use as needed for cough. Recommended taking an OTC allergy medicine to help with the congestion and mucus.    Charm RingsErin J Ritvik Mczeal, MD 08/13/15 [phone]

## 2015-10-04 ENCOUNTER — Emergency Department (HOSPITAL_COMMUNITY): Payer: Medicaid Other

## 2015-10-04 ENCOUNTER — Emergency Department (HOSPITAL_COMMUNITY)
Admission: EM | Admit: 2015-10-04 | Discharge: 2015-10-05 | Disposition: A | Discharge status: Home / Self Care | Payer: Medicaid Other | Attending: Emergency Medicine | Admitting: Emergency Medicine

## 2015-10-04 ENCOUNTER — Encounter (HOSPITAL_COMMUNITY): Payer: Self-pay

## 2015-10-04 DIAGNOSIS — J45909 Unspecified asthma, uncomplicated: Secondary | ICD-10-CM | POA: Insufficient documentation

## 2015-10-04 DIAGNOSIS — R072 Precordial pain: Secondary | ICD-10-CM | POA: Diagnosis present

## 2015-10-04 DIAGNOSIS — Z79899 Other long term (current) drug therapy: Secondary | ICD-10-CM | POA: Insufficient documentation

## 2015-10-04 DIAGNOSIS — R42 Dizziness and giddiness: Secondary | ICD-10-CM

## 2015-10-04 DIAGNOSIS — R0789 Other chest pain: Secondary | ICD-10-CM

## 2015-10-04 LAB — CBC WITH DIFFERENTIAL/PLATELET
Basophils Absolute: 0 10*3/uL (ref 0.0–0.1)
Basophils Relative: 1 %
EOS PCT: 4 %
Eosinophils Absolute: 0.3 10*3/uL (ref 0.0–0.7)
HEMATOCRIT: 41.9 % (ref 39.0–52.0)
Hemoglobin: 15.2 g/dL (ref 13.0–17.0)
LYMPHS ABS: 2.3 10*3/uL (ref 0.7–4.0)
Lymphocytes Relative: 37 %
MCH: 31 pg (ref 26.0–34.0)
MCHC: 36.3 g/dL — ABNORMAL HIGH (ref 30.0–36.0)
MCV: 85.3 fL (ref 78.0–100.0)
MONO ABS: 0.5 10*3/uL (ref 0.1–1.0)
Monocytes Relative: 7 %
NEUTROS ABS: 3.2 10*3/uL (ref 1.7–7.7)
NEUTROS PCT: 51 %
PLATELETS: 225 10*3/uL (ref 150–400)
RBC: 4.91 MIL/uL (ref 4.22–5.81)
RDW: 12 % (ref 11.5–15.5)
WBC: 6.3 10*3/uL (ref 4.0–10.5)

## 2015-10-04 LAB — I-STAT CHEM 8, ED
BUN: 10 mg/dL (ref 6–20)
CREATININE: 0.8 mg/dL (ref 0.61–1.24)
Calcium, Ion: 1.18 mmol/L (ref 1.12–1.23)
Chloride: 99 mmol/L — ABNORMAL LOW (ref 101–111)
Glucose, Bld: 81 mg/dL (ref 65–99)
HEMATOCRIT: 46 % (ref 39.0–52.0)
Hemoglobin: 15.6 g/dL (ref 13.0–17.0)
Potassium: 3.7 mmol/L (ref 3.5–5.1)
Sodium: 139 mmol/L (ref 135–145)
TCO2: 26 mmol/L (ref 0–100)

## 2015-10-04 LAB — I-STAT TROPONIN, ED: Troponin i, poc: 0 ng/mL (ref 0.00–0.08)

## 2015-10-04 MED ORDER — GI COCKTAIL ~~LOC~~
30.0000 mL | Freq: Once | ORAL | Status: AC
Start: 2015-10-04 — End: 2015-10-04
  Administered 2015-10-04: 30 mL via ORAL

## 2015-10-04 NOTE — ED Notes (Signed)
Introduced self to pt. Pt states CP remains 5/10 at this time. Monitoring continues.

## 2015-10-04 NOTE — ED Provider Notes (Signed)
Complains of anterior chest pain for the past several months which is worse with changing positions, nonexertional. He reports today that while running hard he developed lightheadedness and shortness of breath. Lightheadedness and shortness of breath resolved after he rested. On exam patient alert no distress lungs clear auscultation heart regular rate and rhythm no murmurs or rubs chest pain is easily reproducible by forcible abduction of either shoulder. Chest pain is consistent with musculoskeletal chest pain. I feel the patient should have outpatient cardiology evaluation for exertional dyspnea and lightheadedness today. Chest x-ray viewed by me  Doug SouSam Raechel Marcos, MD 10/04/15 2315

## 2015-10-04 NOTE — ED Provider Notes (Signed)
CSN: [id number]     Arrival date & time 10/04/15  2034 History  By signing my name below, I, Linus GalasMaharshi Patel, attest that this documentation has been prepared under the direction and in the presence of Elpidio AnisShari Lelan Cush, PA-C. Electronically Signed: Linus GalasMaharshi Patel, ED Scribe. 10/04/2015. 9:58 PM.   Chief Complaint  Patient presents with  . Chest Pain  . Dizziness   The history is provided by the patient. No language interpreter was used.   HPI Comments: Timothy Horn is a 30 y.o. male  who presents to the Emergency Department complaining of intermittent dull substernal chest pain everyday for the past couple of months worsening today. Today as he was running, he began having "chest tightness" followed by feeling lightheaded, dizzy, mild productive cough, and felt as if his "throat was closing." He states this lasted for one hour after exercising and then subsided. He states his pain is worse with movement and when lying down. Pt is currently is asymptomatic in the ED. Pt denies any fevers, chills, SOB, N/V/D or any other symptoms at this time.    Past Medical History  Diagnosis Date  . Asthma   . Anxiety    History reviewed. No pertinent past surgical history. Family History  Problem Relation Age of Onset  . Mental illness Sister   . Hyperlipidemia Maternal Grandmother    Social History  Substance Use Topics  . Smoking status: Never Smoker   . Smokeless tobacco: None  . Alcohol Use: Yes    Review of Systems  Constitutional: Negative for fever and chills.  Respiratory: Positive for cough and chest tightness. Negative for shortness of breath.   Cardiovascular: Positive for chest pain.  Gastrointestinal: Negative for nausea and diarrhea.  Neurological: Positive for dizziness and light-headedness.   Allergies  Ativan and Paxil  Home Medications   Prior to Admission medications   Medication Sig Start Date End Date Taking? Authorizing Provider  loratadine-pseudoephedrine  (CLARITIN-D 24-HOUR) 10-240 MG 24 hr tablet Take 1 tablet by mouth daily as needed for allergies.    Yes Historical Provider, MD  HYDROcodone-homatropine (HYCODAN) 5-1.5 MG/5ML syrup Take 5 mLs by mouth every 6 (six) hours as needed for cough. Patient not taking: Reported on 10/04/2015 08/13/15   Charm RingsErin J Honig, MD  omeprazole (PRILOSEC) 20 MG capsule Take 1 capsule (20 mg total) by mouth daily. Patient not taking: Reported on 10/04/2015 11/30/14   Linwood DibblesJon Knapp, MD   BP 124/83 mmHg  Pulse 81  Temp(Src) 98.1 F (36.7 C) (Oral)  Resp 20  Ht 5\' 9"  (1.753 m)  Wt 175 lb (79.379 kg)  BMI 25.83 kg/m2  SpO2 100%   Physical Exam  Constitutional: He is oriented to person, place, and time. He appears well-developed and well-nourished.  HENT:  Head: Normocephalic and atraumatic.  Neck: Neck supple. No thyromegaly present.  Cardiovascular: Normal rate, regular rhythm and normal heart sounds.   No murmur heard. Pulmonary/Chest: Effort normal and breath sounds normal. He has no wheezes. He has no rales. He exhibits tenderness.  Full and clear air movements to all lung fields; chest wall is mildly TTP over the sternum; no parasternal tenderness.  Abdominal: There is no tenderness.  Abdomen is non-tender, specifically non-tender in the epigastrium.   Musculoskeletal: Normal range of motion.  Neurological: He is alert and oriented to person, place, and time.  Skin: Skin is warm and dry.  Psychiatric: He has a normal mood and affect.  Nursing note and vitals reviewed.  ED Course  Procedures   DIAGNOSTIC STUDIES: Oxygen Saturation is 100% on room air, normal by my interpretation.    COORDINATION OF CARE: 9:51 PM Will give GI cocktail. Will order CXR and EKG Discussed treatment plan with pt at bedside and pt agreed to plan.  Labs Review Labs Reviewed - No data to display  Imaging Review No results found. I have personally reviewed and evaluated these images and lab results as part of my medical  decision-making.   EKG Interpretation None      MDM   Final diagnoses:  None    1. Chest wall pain 2. Exertional dizziness  The patient is well appearing, in NAD, VSS. Chest pain is reproducible suggesting chest wall pain, but does not explain exertional dizziness. Labs, EKG, CXR reassuring. He is examined by Dr. Ethelda Chick and is felt to need further outpatient evaluation for exertional symptoms. He is considered stable for discharge home.   I personally performed the services described in this documentation, which was scribed in my presence. The recorded information has been reviewed and is accurate.  '   Elpidio Anis, PA-C 10/05/15 0014  Doug Sou, MD 10/05/15 [id number]

## 2015-10-04 NOTE — ED Notes (Signed)
Pt complains of chest pain and dizziness upon exertion today while running at the gym

## 2015-10-05 MED ORDER — IBUPROFEN 800 MG PO TABS: 800.0000 mg | ORAL_TABLET | Freq: Three times a day (TID) | ORAL | Status: DC

## 2015-10-05 NOTE — ED Notes (Signed)
Melvenia BeamShari, PA at bedside at this time.

## 2015-10-05 NOTE — Discharge Instructions (Signed)
Dizziness Dizziness is a common problem. It is a feeling of unsteadiness or light-headedness. You may feel like you are about to faint. Dizziness can lead to injury if you stumble or fall. Anyone can become dizzy, but dizziness is more common in older adults. This condition can be caused by a number of things, including medicines, dehydration, or illness. HOME CARE INSTRUCTIONS Taking these steps may help with your condition: Eating and Drinking  Drink enough fluid to keep your urine clear or pale yellow. This helps to keep you from becoming dehydrated. Try to drink more clear fluids, such as water.  Do not drink alcohol.  Limit your caffeine intake if directed by your health care provider.  Limit your salt intake if directed by your health care provider. Activity  Avoid making quick movements.  Rise slowly from chairs and steady yourself until you feel okay.  In the morning, first sit up on the side of the bed. When you feel okay, stand slowly while you hold onto something until you know that your balance is fine.  Move your legs often if you need to stand in one place for a long time. Tighten and relax your muscles in your legs while you are standing.  Do not drive or operate heavy machinery if you feel dizzy.  Avoid bending down if you feel dizzy. Place items in your home so that they are easy for you to reach without leaning over. Lifestyle  Do not use any tobacco products, including cigarettes, chewing tobacco, or electronic cigarettes. If you need help quitting, ask your health care provider.  Try to reduce your stress level, such as with yoga or meditation. Talk with your health care provider if you need help. General Instructions  Watch your dizziness for any changes.  Take medicines only as directed by your health care provider. Talk with your health care provider if you think that your dizziness is caused by a medicine that you are taking.  Tell a friend or a family  member that you are feeling dizzy. If he or she notices any changes in your behavior, have this person call your health care provider.  Keep all follow-up visits as directed by your health care provider. This is important. SEEK MEDICAL CARE IF:  Your dizziness does not go away.  Your dizziness or light-headedness gets worse.  You feel nauseous.  You have reduced hearing.  You have new symptoms.  You are unsteady on your feet or you feel like the room is spinning. SEEK IMMEDIATE MEDICAL CARE IF:  You vomit or have diarrhea and are unable to eat or drink anything.  You have problems talking, walking, swallowing, or using your arms, hands, or legs.  You feel generally weak.  You are not thinking clearly or you have trouble forming sentences. It may take a friend or family member to notice this.  You have chest pain, abdominal pain, shortness of breath, or sweating.  Your vision changes.  You notice any bleeding.  You have a headache.  You have neck pain or a stiff neck.  You have a fever.   This information is not intended to replace advice given to you by your health care provider. Make sure you discuss any questions you have with your health care provider.   Document Released: 11/01/2000 Document Revised: 09/22/2014 Document Reviewed: 05/04/2014 Elsevier Interactive Patient Education 2016 Elsevier Inc[employer] Therapy Heat therapy can help ease sore, stiff, injured, and tight muscles and joints. Heat relaxes your muscles,  which may help ease your pain.  RISKS AND COMPLICATIONS If you have any of the following conditions, do not use heat therapy unless your health care provider has approved:  Poor circulation.  Healing wounds or scarred skin in the area being treated.  Diabetes, heart disease, or high blood pressure.  Not being able to feel (numbness) the area being treated.  Unusual swelling of the area being treated.  Active infections.  Blood  clots.  Cancer.  Inability to communicate pain. This may include young children and people who have problems with their brain function (dementia).  Pregnancy. Heat therapy should only be used on old, pre-existing, or long-lasting (chronic) injuries. Do not use heat therapy on new injuries unless directed by your health care provider. HOW TO USE HEAT THERAPY There are several different kinds of heat therapy, including:  Moist heat pack.  Warm water bath.  Hot water bottle.  Electric heating pad.  Heated gel pack.  Heated wrap.  Electric heating pad. Use the heat therapy method suggested by your health care provider. Follow your health care provider's instructions on when and how to use heat therapy. GENERAL HEAT THERAPY RECOMMENDATIONS  Do not sleep while using heat therapy. Only use heat therapy while you are awake.  Your skin may turn pink while using heat therapy. Do not use heat therapy if your skin turns red.  Do not use heat therapy if you have new pain.  High heat or long exposure to heat can cause burns. Be careful when using heat therapy to avoid burning your skin.  Do not use heat therapy on areas of your skin that are already irritated, such as with a rash or sunburn. SEEK MEDICAL CARE IF:  You have blisters, redness, swelling, or numbness.  You have new pain.  Your pain is worse. MAKE SURE YOU:  Understand these instructions.  Will watch your condition.  Will get help right away if you are not doing well or get worse.   This information is not intended to replace advice given to you by your health care provider. Make sure you discuss any questions you have with your health care provider.   Document Released: 07/31/2011 Document Revised: 05/29/2014 Document Reviewed: 07/01/2013 Elsevier Interactive Patient Education 2016 Elsevier Inc. Chest Wall Pain Chest wall pain is pain in or around the bones and muscles of your chest. Sometimes, an injury causes  this pain. Sometimes, the cause may not be known. This pain may take several weeks or longer to get better. HOME CARE INSTRUCTIONS  Pay attention to any changes in your symptoms. Take these actions to help with your pain:   Rest as told by your health care provider.   Avoid activities that cause pain. These include any activities that use your chest muscles or your abdominal and side muscles to lift heavy items.   If directed, apply ice to the painful area:  Put ice in a plastic bag.  Place a towel between your skin and the bag.  Leave the ice on for 20 minutes, 2-3 times per day.  Take over-the-counter and prescription medicines only as told by your health care provider.  Do not use tobacco products, including cigarettes, chewing tobacco, and e-cigarettes. If you need help quitting, ask your health care provider.  Keep all follow-up visits as told by your health care provider. This is important. SEEK MEDICAL CARE IF:  You have a fever.  Your chest pain becomes worse.  You have new symptoms. SEEK IMMEDIATE  MEDICAL CARE IF:  You have nausea or vomiting.  You feel sweaty or light-headed.  You have a cough with phlegm (sputum) or you cough up blood.  You develop shortness of breath.   This information is not intended to replace advice given to you by your health care provider. Make sure you discuss any questions you have with your health care provider.   Document Released: 05/08/2005 Document Revised: 01/27/2015 Document Reviewed: 08/03/2014 Elsevier Interactive Patient Education [employer] Elsevier Inc[employer]

## 2015-10-05 NOTE — ED Notes (Signed)
Pt given discharge instructions, verbalized understanding, denied further questions or concerns. Pt aware of need to follow up. Pt able to ambulate to exit at this time, without difficulty, moving all extremities well.

## 2015-10-11 ENCOUNTER — Other Ambulatory Visit (HOSPITAL_COMMUNITY): Payer: Self-pay | Admitting: Family Medicine

## 2015-10-11 DIAGNOSIS — R079 Chest pain, unspecified: Secondary | ICD-10-CM

## 2015-10-28 ENCOUNTER — Ambulatory Visit (HOSPITAL_COMMUNITY): Payer: Medicaid Other | Attending: Cardiovascular Disease

## 2015-10-28 ENCOUNTER — Other Ambulatory Visit: Payer: Self-pay

## 2015-10-28 DIAGNOSIS — R079 Chest pain, unspecified: Secondary | ICD-10-CM | POA: Insufficient documentation

## 2015-10-28 LAB — ECHOCARDIOGRAM COMPLETE
CHL CUP DOP CALC LVOT VTI: 19.2 cm
CHL CUP MV DEC (S): 218
CHL CUP TV REG PEAK VELOCITY: 204 cm/s
E/e' ratio: 4.79
EWDT: 218 ms
FS: 41 % (ref 28–44)
IVS/LV PW RATIO, ED: 0.95
LA ID, A-P, ES: 34 mm
LA diam end sys: 34 mm
LA vol A4C: 27 ml
LA vol: 35 mL
LADIAMINDEX: 1.74 cm/m2
LAVOLIN: 17.9 mL/m2
LV TDI E'LATERAL: 14
LV e' LATERAL: 14 cm/s
LVEEAVG: 4.79
LVEEMED: 4.79
LVOT SV: 73 mL
LVOT area: 3.8 cm2
LVOT diameter: 22 mm
LVOT peak vel: 88 cm/s
MVPKAVEL: 46.9 m/s
MVPKEVEL: 67.1 m/s
PW: 9.87 mm — AB (ref 0.6–1.1)
TDI e' medial: 10.5
TRMAXVEL: 204 cm/s

## 2016-06-17 ENCOUNTER — Encounter (HOSPITAL_COMMUNITY): Payer: Self-pay

## 2016-06-17 ENCOUNTER — Emergency Department (HOSPITAL_COMMUNITY)
Admission: EM | Admit: 2016-06-17 | Discharge: 2016-06-17 | Disposition: A | Discharge status: Home / Self Care | Payer: Self-pay | Attending: Emergency Medicine | Admitting: Emergency Medicine

## 2016-06-17 ENCOUNTER — Emergency Department (HOSPITAL_COMMUNITY): Payer: Self-pay

## 2016-06-17 DIAGNOSIS — J40 Bronchitis, not specified as acute or chronic: Secondary | ICD-10-CM | POA: Insufficient documentation

## 2016-06-17 MED ORDER — PREDNISONE 20 MG PO TABS
60.0000 mg | ORAL_TABLET | Freq: Once | ORAL | Status: AC
  Administered 2016-06-17: 60 mg via ORAL
  Filled 2016-06-17: qty 3

## 2016-06-17 MED ORDER — ALBUTEROL SULFATE HFA 108 (90 BASE) MCG/ACT IN AERS
2.0000 | INHALATION_SPRAY | Freq: Once | RESPIRATORY_TRACT | Status: AC
  Administered 2016-06-17: 2 via RESPIRATORY_TRACT
  Filled 2016-06-17: qty 6.7

## 2016-06-17 MED ORDER — ALBUTEROL SULFATE HFA 108 (90 BASE) MCG/ACT IN AERS: 2.0000 | INHALATION_SPRAY | RESPIRATORY_TRACT | 0 refills | Status: DC | PRN

## 2016-06-17 MED ORDER — PREDNISONE 20 MG PO TABS
40.0000 mg | ORAL_TABLET | Freq: Every day | ORAL | 0 refills | Status: DC
Start: 2016-06-17 — End: 2016-12-09

## 2016-06-17 NOTE — ED Notes (Signed)
Declined W/C at D/C and was escorted to lobby by RN. 

## 2016-06-17 NOTE — ED Provider Notes (Signed)
[facility]-EMERGENCY DEPT Provider Note   CSN: [id number] Arrival date & time: 06/17/16  4098  By signing my name below, I, Bing Neighbors., attest that this documentation has been prepared under the direction and in the presence of Shon Baton, MD. Electronically signed: Bing Neighbors., ED Scribe. 06/17/16. 2:31 PM.    History   Chief Complaint Chief Complaint  Patient presents with  . Cough    HPI  Timothy Horn is a 31 y.o. male with hx of exercise-induced asthma who presents to the Emergency Department complaining of worsening mild to moderate cough with sudden onset x1 week. Pt states that he has had a worsening cough for the past week. He states that the cough has been productive and yesterday he had an episode of post tussive emesis. Pt reports sore throat, congestion and rhinorrhea. Pt has taken Robitussin, Delsym and Theraflu for the symptoms with no relief. Pt reportedly felt SOB yesterday and used a nebulizer with mild relief. Pt denies sinus pressure, fever. Of note, pt denies smoking. He states that his symptoms are worse at night and in the morning.   The history is provided by the patient. No language interpreter was used.  Cough  Associated symptoms include rhinorrhea and sore throat.    Past Medical History:  Diagnosis Date  . Anxiety   . Asthma     Patient Active Problem List   Diagnosis Date Noted  . DYSURIA 04/09/2009    History reviewed. No pertinent surgical history.     Home Medications    Prior to Admission medications   Medication Sig Start Date End Date Taking? Authorizing Provider  albuterol (PROVENTIL HFA;VENTOLIN HFA) 108 (90 Base) MCG/ACT inhaler Inhale 2 puffs into the lungs every 4 (four) hours as needed for wheezing or shortness of breath. 06/17/16   Shon Baton, MD  HYDROcodone-homatropine (HYCODAN) 5-1.5 MG/5ML syrup Take 5 mLs by mouth every 6 (six) hours as needed for cough. Patient not taking: Reported  on 10/04/2015 08/13/15   Charm Rings, MD  ibuprofen (ADVIL,MOTRIN) 800 MG tablet Take 1 tablet (800 mg total) by mouth 3 (three) times daily. 10/05/15   Elpidio Anis, PA-C  loratadine-pseudoephedrine (CLARITIN-D 24-HOUR) 10-240 MG 24 hr tablet Take 1 tablet by mouth daily as needed for allergies.     Historical Provider, MD  omeprazole (PRILOSEC) 20 MG capsule Take 1 capsule (20 mg total) by mouth daily. Patient not taking: Reported on 10/04/2015 11/30/14   Linwood Dibbles, MD  predniSONE (DELTASONE) 20 MG tablet Take 2 tablets (40 mg total) by mouth daily. 06/17/16   Shon Baton, MD    Family History Family History  Problem Relation Age of Onset  . Mental illness Sister   . Hyperlipidemia Maternal Grandmother     Social History Social History  Substance Use Topics  . Smoking status: Never Smoker  . Smokeless tobacco: Not on file  . Alcohol use Yes     Allergies   Ativan [lorazepam] and Paxil [paroxetine hcl]   Review of Systems Review of Systems  Constitutional: Negative for fever.  HENT: Positive for congestion, rhinorrhea and sore throat. Negative for sinus pressure.   Respiratory: Positive for cough.   Gastrointestinal: Negative for nausea and vomiting.  All other systems reviewed and are negative.    Physical Exam Updated Vital Signs BP 108/80 (BP Location: Right Arm)   Pulse 82   Temp 98.3 F (36.8 C) (Oral)   Resp 20   SpO2 95%  Physical Exam  Constitutional: He is oriented to person, place, and time. He appears well-developed and well-nourished. No distress.  HENT:  Head: Normocephalic and atraumatic.  Mouth/Throat: Oropharynx is clear and moist. No oropharyngeal exudate.  Usual midline, no posterior or pharyngeal erythema  Cardiovascular: Normal rate, regular rhythm and normal heart sounds.   No murmur heard. Pulmonary/Chest: Effort normal and breath sounds normal. No respiratory distress. He has no wheezes. He has no rales.  Musculoskeletal: He exhibits  no edema.  Neurological: He is alert and oriented to person, place, and time.  Skin: Skin is warm and dry.  Psychiatric: He has a normal mood and affect.  Nursing note and vitals reviewed.    ED Treatments / Results   DIAGNOSTIC STUDIES: Oxygen Saturation is 95% on RA, inadequate by my interpretation.   COORDINATION OF CARE: 2:31 PM-Discussed next steps with pt. Pt verbalized understanding and is agreeable with the plan.    Labs (all labs ordered are listed, but only abnormal results are displayed) Labs Reviewed - No data to display  EKG  EKG Interpretation None       Radiology Dg Chest 2 View  Result Date: 06/17/2016 CLINICAL DATA:  Productive cough for 10 days. EXAM: CHEST  2 VIEW COMPARISON:  10/04/2015 FINDINGS: The heart size and mediastinal contours are within normal limits. Both lungs are clear. The visualized skeletal structures are unremarkable. IMPRESSION: No active cardiopulmonary disease. Electronically Signed   By: Ted Mcalpineobrinka  Dimitrova M.D.   On: 06/17/2016 14:24    Procedures Procedures (including critical care time)  Medications Ordered in ED Medications  albuterol (PROVENTIL HFA;VENTOLIN HFA) 108 (90 Base) MCG/ACT inhaler 2 puff (2 puffs Inhalation Given 06/17/16 1339)  predniSONE (DELTASONE) tablet 60 mg (60 mg Oral Given 06/17/16 1339)     Initial Impression / Assessment and Plan / ED Course  I have reviewed the triage vital signs and the nursing notes.  Pertinent labs & imaging results that were available during my care of the patient were reviewed by me and considered in my medical decision making (see chart for details).     Patient presents with cough greater than one week. History of exercise-induced asthma. Cough worse at night. He is nontoxic. Vital signs reassuring. Exam is fairly benign. Suspect URI causing bronchitis.  Given the patient reports some improvement with nebulizer, will treat with inhaler and steroids. Discussed with patient  that I felt that a chest x-ray would be low yield given he is afebrile and his breath sounds are clear at this time. However, patient requesting x-ray to rule out pneumonia. X-ray is negative. Will discharge with plan as above.  After history, exam, and medical workup I feel the patient has been appropriately medically screened and is safe for discharge home. Pertinent diagnoses were discussed with the patient. Patient was given return precautions.   Final Clinical Impressions(s) / ED Diagnoses   Final diagnoses:  Bronchitis    New Prescriptions New Prescriptions   ALBUTEROL (PROVENTIL HFA;VENTOLIN HFA) 108 (90 BASE) MCG/ACT INHALER    Inhale 2 puffs into the lungs every 4 (four) hours as needed for wheezing or shortness of breath.   PREDNISONE (DELTASONE) 20 MG TABLET    Take 2 tablets (40 mg total) by mouth daily.   I personally performed the services described in this documentation, which was scribed in my presence. The recorded information has been reviewed and is accurate.     Shon Batonourtney F Keiry Kowal, MD 06/17/16 [phone]

## 2016-06-17 NOTE — ED Notes (Signed)
Pt too Nurse First wondering when he is going to be seen. Noticed moved Off the floor. Placed back in waiting.

## 2016-06-17 NOTE — ED Triage Notes (Signed)
Patient here with cough and cold symptoms x 1 week, cough worse at night. States drainage making his throat sore, NAD

## 2016-12-08 ENCOUNTER — Emergency Department (HOSPITAL_COMMUNITY)
Admission: EM | Admit: 2016-12-08 | Discharge: 2016-12-09 | Disposition: A | Discharge status: Home / Self Care | Payer: Medicaid Other | Attending: Emergency Medicine | Admitting: Emergency Medicine

## 2016-12-08 ENCOUNTER — Emergency Department (HOSPITAL_COMMUNITY): Payer: Medicaid Other

## 2016-12-08 ENCOUNTER — Encounter (HOSPITAL_COMMUNITY): Payer: Self-pay | Admitting: Emergency Medicine

## 2016-12-08 DIAGNOSIS — Z79899 Other long term (current) drug therapy: Secondary | ICD-10-CM | POA: Insufficient documentation

## 2016-12-08 DIAGNOSIS — R51 Headache: Secondary | ICD-10-CM | POA: Insufficient documentation

## 2016-12-08 DIAGNOSIS — J45909 Unspecified asthma, uncomplicated: Secondary | ICD-10-CM | POA: Insufficient documentation

## 2016-12-08 DIAGNOSIS — R519 Headache, unspecified: Secondary | ICD-10-CM

## 2016-12-08 MED ORDER — ONDANSETRON 4 MG PO TBDP
4.0000 mg | ORAL_TABLET | Freq: Once | ORAL | Status: AC
  Administered 2016-12-08: 4 mg via ORAL

## 2016-12-08 MED ORDER — ONDANSETRON 4 MG PO TBDP
ORAL_TABLET | ORAL | Status: AC
  Filled 2016-12-08: qty 1

## 2016-12-08 NOTE — ED Triage Notes (Signed)
Pt states while dead lifting 2 days ago suddenly became dizzy, HA began shortly after. Pt states n/v began the following morning. Pt states HA, n/v, dizziness and blurred vision have continued since that time.  Pt reports near syncope with initial episode.  Pt A & O in triage,

## 2016-12-09 LAB — CSF CELL COUNT WITH DIFFERENTIAL
Eosinophils, CSF: 0 % (ref 0–1)
Eosinophils, CSF: 0 % (ref 0–1)
RBC COUNT CSF: 1 /mm3 — AB
RBC COUNT CSF: 3 /mm3 — AB
Tube #: 1
Tube #: 4
WBC, CSF: 0 /mm3 (ref 0–5)
WBC, CSF: 1 /mm3 (ref 0–5)

## 2016-12-09 LAB — GLUCOSE, CSF: GLUCOSE CSF: 68 mg/dL (ref 40–70)

## 2016-12-09 LAB — PROTEIN, CSF: TOTAL PROTEIN, CSF: 30 mg/dL (ref 15–45)

## 2016-12-09 MED ORDER — DIPHENHYDRAMINE HCL 50 MG/ML IJ SOLN
25.0000 mg | Freq: Once | INTRAMUSCULAR | Status: AC
  Administered 2016-12-09: 25 mg via INTRAVENOUS
  Filled 2016-12-09: qty 1

## 2016-12-09 MED ORDER — SODIUM CHLORIDE 0.9 % IV BOLUS (SEPSIS)
1000.0000 mL | Freq: Once | INTRAVENOUS | Status: AC
Start: 2016-12-09 — End: 2016-12-09
  Administered 2016-12-09: 1000 mL via INTRAVENOUS

## 2016-12-09 MED ORDER — ONDANSETRON 4 MG PO TBDP
8.0000 mg | ORAL_TABLET | Freq: Once | ORAL | Status: AC
  Administered 2016-12-09: 8 mg via ORAL
  Filled 2016-12-09: qty 2

## 2016-12-09 MED ORDER — ONDANSETRON 4 MG PO TBDP
ORAL_TABLET | ORAL | Status: AC
  Administered 2016-12-09: 8 mg via ORAL
  Filled 2016-12-09: qty 1

## 2016-12-09 MED ORDER — METOCLOPRAMIDE HCL 5 MG/ML IJ SOLN
10.0000 mg | Freq: Once | INTRAMUSCULAR | Status: AC
  Administered 2016-12-09: 10 mg via INTRAVENOUS
  Filled 2016-12-09: qty 2

## 2016-12-09 MED ORDER — MORPHINE SULFATE (PF) 4 MG/ML IV SOLN
4.0000 mg | Freq: Once | INTRAVENOUS | Status: AC
Start: 2016-12-09 — End: 2016-12-09
  Administered 2016-12-09: 4 mg via INTRAVENOUS
  Filled 2016-12-09: qty 1

## 2016-12-09 MED ORDER — LIDOCAINE HCL (PF) 1 % IJ SOLN
5.0000 mL | Freq: Once | INTRAMUSCULAR | Status: AC
  Administered 2016-12-09: 5 mL via INTRADERMAL
  Filled 2016-12-09: qty 5

## 2016-12-09 NOTE — ED Provider Notes (Signed)
[facility] in [facility] Emergency Department [facility] Patient reports sudden onset of HA after heavy lifting Exam : awake/alert, no focal neuro deficits, appears uncomfortable Plan: he agrees to proceed with [facility]    Zadie RhineWickline, Marcelis Wissner, MD 12/09/16 0302

## 2016-12-09 NOTE — ED Notes (Signed)
Pt departed in NAD, refused use of wheelchair. Calling wife for ride home.

## 2016-12-09 NOTE — ED Provider Notes (Signed)
[facility] DEPT Provider Note   CSN: [id number] Arrival date & time: 12/08/16  2235     History   Chief Complaint Chief Complaint  Patient presents with  . Headache    HPI Timothy Horn is a 31 y.o. male.  Patient presents to the emergency department with chief complaint of headache. He states that he was dead lifting 3 days ago and felt a pop in his head. He states it was immediately followed by a headache. He reports associated nausea, vomiting, blurred vision, and dizziness. He states the headache has been persistent. It has not improved with ibuprofen. He denies any numbness or weakness of his extremities. Denies any slurred speech or double vision. There are no other associated symptoms. He denies ever having had a headache like this before.   The history is provided by the patient. No language interpreter was used.    Past Medical History:  Diagnosis Date  . Anxiety   . Asthma     Patient Active Problem List   Diagnosis Date Noted  . DYSURIA 04/09/2009    History reviewed. No pertinent surgical history.     Home Medications    Prior to Admission medications   Medication Sig Start Date End Date Taking? Authorizing Provider  albuterol (PROVENTIL HFA;VENTOLIN HFA) 108 (90 Base) MCG/ACT inhaler Inhale 2 puffs into the lungs every 4 (four) hours as needed for wheezing or shortness of breath. 06/17/16   Horton, Mayer Masker, MD  HYDROcodone-homatropine (HYCODAN) 5-1.5 MG/5ML syrup Take 5 mLs by mouth every 6 (six) hours as needed for cough. Patient not taking: Reported on 10/04/2015 08/13/15   Charm Rings, MD  ibuprofen (ADVIL,MOTRIN) 800 MG tablet Take 1 tablet (800 mg total) by mouth 3 (three) times daily. 10/05/15   Elpidio Anis, PA-C  loratadine-pseudoephedrine (CLARITIN-D 24-HOUR) 10-240 MG 24 hr tablet Take 1 tablet by mouth daily as needed for allergies.     [provider]  omeprazole (PRILOSEC) 20 MG capsule Take 1 capsule (20 mg total) by  mouth daily. Patient not taking: Reported on 10/04/2015 11/30/14   Linwood Dibbles, MD  predniSONE (DELTASONE) 20 MG tablet Take 2 tablets (40 mg total) by mouth daily. 06/17/16   Horton, Mayer Masker, MD    Family History Family History  Problem Relation Age of Onset  . Mental illness Sister   . Hyperlipidemia Maternal Grandmother     Social History Social History  Substance Use Topics  . Smoking status: Never Smoker  . Smokeless tobacco: Never Used  . Alcohol use Yes     Allergies   Ativan [lorazepam] and Paxil [paroxetine hcl]   Review of Systems Review of Systems  All other systems reviewed and are negative.    Physical Exam Updated Vital Signs BP 110/73   Pulse 86   Temp 98.5 F (36.9 C) (Oral)   Resp 16   Ht 5\' 9"  (1.753 m)   Wt 79.4 kg (175 lb)   SpO2 97%   BMI 25.84 kg/m   Physical Exam  Constitutional: He is oriented to person, place, and time. He appears well-developed and well-nourished.  HENT:  Head: Normocephalic and atraumatic.  Right Ear: External ear normal.  Left Ear: External ear normal.  Eyes: Pupils are equal, round, and reactive to light. Conjunctivae and EOM are normal.  Neck: Normal range of motion. Neck supple.  No pain with neck flexion, no meningismus  Cardiovascular: Normal rate, regular rhythm and normal heart sounds.  Exam reveals no gallop and no  friction rub.   No murmur heard. Pulmonary/Chest: Effort normal and breath sounds normal. No respiratory distress. He has no wheezes. He has no rales. He exhibits no tenderness.  Abdominal: Soft. He exhibits no distension and no mass. There is no tenderness. There is no rebound and no guarding.  Musculoskeletal: Normal range of motion. He exhibits no edema or tenderness.  Normal gait.  Neurological: He is alert and oriented to person, place, and time. He has normal reflexes.  CN 3-12 intact, normal finger to nose, no pronator drift, sensation and strength intact bilaterally.  Skin: Skin is warm  and dry.  Psychiatric: He has a normal mood and affect. His behavior is normal. Judgment and thought content normal.  Nursing note and vitals reviewed.    ED Treatments / Results  Labs (all labs ordered are listed, but only abnormal results are displayed) Labs Reviewed  CSF CELL COUNT WITH DIFFERENTIAL - Abnormal; Notable for the following:       Result Value   RBC Count, CSF 3 (*)    All other components within normal limits  CSF CELL COUNT WITH DIFFERENTIAL - Abnormal; Notable for the following:    RBC Count, CSF 1 (*)    All other components within normal limits  CSF CULTURE  GLUCOSE, CSF  PROTEIN, CSF    EKG  EKG Interpretation None       Radiology Ct Head Wo Contrast  Result Date: 12/08/2016 CLINICAL DATA:  Initial evaluation for acute dizziness, headache, blurry vision. EXAM: CT HEAD WITHOUT CONTRAST TECHNIQUE: Contiguous axial images were obtained from the base of the skull through the vertex without intravenous contrast. COMPARISON:  Prior CT from 07/09/2004. FINDINGS: Brain: Cerebral volume within normal limits for patient age. No evidence for acute intracranial hemorrhage. No findings to suggest acute large vessel territory infarct. No mass lesion, midline shift, or mass effect. Ventricles are normal in size without evidence for hydrocephalus. No extra-axial fluid collection identified. Vascular: No hyperdense vessel identified. Skull: Scalp soft tissues demonstrate no acute abnormality.Calvarium intact. Sinuses/Orbits: Globes and orbital soft tissues are within normal limits. Scattered mucosal thickening within the ethmoidal air cells and maxillary sinuses. Paranasal sinuses are otherwise clear. No mastoid effusion. IMPRESSION: Normal head CT.  No acute intracranial process. Electronically Signed   By: Rise MuBenjamin  McClintock M.D.   On: 12/08/2016 23:34    Procedures .Lumbar Puncture Date/Time: 12/09/2016 3:29 AM Performed by: Roxy HorsemanBROWNING, Hassel Uphoff Authorized by: Roxy HorsemanBROWNING,  Mirza Fessel   Consent:    Consent obtained:  Verbal and written   Consent given by:  Patient   Risks discussed:  Headache   Alternatives discussed:  No treatment Pre-procedure details:    Procedure purpose:  Diagnostic   Preparation: Patient was prepped and draped in usual sterile fashion   Anesthesia (see MAR for exact dosages):    Anesthesia method:  Local infiltration   Local anesthetic:  Lidocaine 1% w/o epi Procedure details:    Lumbar space:  L3-L4 interspace   Patient position:  Sitting   Needle gauge:  18   Needle type:  Spinal needle - Quincke tip   Needle length (in):  3.5   Ultrasound guidance: no     Number of attempts:  1   Fluid appearance:  Clear   Tubes of fluid:  4   Total volume (ml):  1 Post-procedure:    Puncture site:  Adhesive bandage applied and direct pressure applied   Patient tolerance of procedure:  Tolerated well, no immediate complications   (including  critical care time)  Medications Ordered in ED Medications  lidocaine (PF) (XYLOCAINE) 1 % injection 5 mL (not administered)  ondansetron (ZOFRAN-ODT) disintegrating tablet 4 mg (4 mg Oral Given 12/08/16 2253)     Initial Impression / Assessment and Plan / ED Course  I have reviewed the triage vital signs and the nursing notes.  Pertinent labs & imaging results that were available during my care of the patient were reviewed by me and considered in my medical decision making (see chart for details).     Patient with sudden onset headache with associated nausea, vomiting, blurred vision, and dizziness following a dead lift 3 days ago. Discussed with Dr. Bebe Shaggy, who recommends lumbar puncture to rule out bleed. CT noncontrast is negative, however we're outside of the acceptable timeframe to rule out acute bleed with CT.  I discussed the risks and benefits of the lumbar puncture with patient. Patient agrees and would like to have the procedure done. Verbal and written consent obtained.  LP performed  as above without complication.  Lumbar puncture cell counts are inconsistent with subarachnoid hemorrhage. Patient's headache has subsided. He feels well now. Will discharge to home with neurology follow-up. Patient stands and agrees to plan. He is stable and ready for discharge.  Patient seen by and discussed with Dr. Bebe Shaggy.  Final Clinical Impressions(s) / ED Diagnoses   Final diagnoses:  Acute nonintractable headache, unspecified headache type    New Prescriptions New Prescriptions   No medications on file     Roxy Horseman, Cordelia Poche 12/09/16 0450    Zadie Rhine, MD 12/09/16 [phone]

## 2016-12-12 LAB — CSF CULTURE W GRAM STAIN: Culture: NO GROWTH

## 2016-12-12 LAB — CSF CULTURE

## 2018-06-16 ENCOUNTER — Inpatient Hospital Stay (HOSPITAL_COMMUNITY)
Admission: EM | Admit: 2018-06-16 | Discharge: 2018-07-02 | DRG: 200 | Disposition: A | Discharge status: Home / Self Care | Payer: Self-pay | Attending: Oncology | Admitting: Oncology

## 2018-06-16 ENCOUNTER — Emergency Department (HOSPITAL_COMMUNITY): Payer: Self-pay

## 2018-06-16 ENCOUNTER — Encounter (HOSPITAL_COMMUNITY): Payer: Self-pay

## 2018-06-16 ENCOUNTER — Inpatient Hospital Stay (HOSPITAL_COMMUNITY): Payer: Self-pay

## 2018-06-16 DIAGNOSIS — K5900 Constipation, unspecified: Secondary | ICD-10-CM | POA: Diagnosis present

## 2018-06-16 DIAGNOSIS — Z818 Family history of other mental and behavioral disorders: Secondary | ICD-10-CM

## 2018-06-16 DIAGNOSIS — J9690 Respiratory failure, unspecified, unspecified whether with hypoxia or hypercapnia: Secondary | ICD-10-CM

## 2018-06-16 DIAGNOSIS — J939 Pneumothorax, unspecified: Secondary | ICD-10-CM

## 2018-06-16 DIAGNOSIS — R0602 Shortness of breath: Secondary | ICD-10-CM

## 2018-06-16 DIAGNOSIS — Z888 Allergy status to other drugs, medicaments and biological substances status: Secondary | ICD-10-CM

## 2018-06-16 DIAGNOSIS — J4520 Mild intermittent asthma, uncomplicated: Secondary | ICD-10-CM | POA: Diagnosis present

## 2018-06-16 DIAGNOSIS — Z978 Presence of other specified devices: Secondary | ICD-10-CM

## 2018-06-16 DIAGNOSIS — J9811 Atelectasis: Secondary | ICD-10-CM

## 2018-06-16 DIAGNOSIS — J9383 Other pneumothorax: Principal | ICD-10-CM

## 2018-06-16 DIAGNOSIS — J982 Interstitial emphysema: Secondary | ICD-10-CM | POA: Diagnosis not present

## 2018-06-16 DIAGNOSIS — Z8349 Family history of other endocrine, nutritional and metabolic diseases: Secondary | ICD-10-CM

## 2018-06-16 DIAGNOSIS — Z79899 Other long term (current) drug therapy: Secondary | ICD-10-CM

## 2018-06-16 DIAGNOSIS — Z9689 Presence of other specified functional implants: Secondary | ICD-10-CM

## 2018-06-16 DIAGNOSIS — J45990 Exercise induced bronchospasm: Secondary | ICD-10-CM

## 2018-06-16 DIAGNOSIS — R197 Diarrhea, unspecified: Secondary | ICD-10-CM | POA: Diagnosis not present

## 2018-06-16 DIAGNOSIS — F419 Anxiety disorder, unspecified: Secondary | ICD-10-CM | POA: Diagnosis present

## 2018-06-16 LAB — HEPATIC FUNCTION PANEL
ALT: 23 U/L (ref 0–44)
AST: 23 U/L (ref 15–41)
Albumin: 4.1 g/dL (ref 3.5–5.0)
Alkaline Phosphatase: 24 U/L — ABNORMAL LOW (ref 38–126)
Bilirubin, Direct: 0.2 mg/dL (ref 0.0–0.2)
Indirect Bilirubin: 0.7 mg/dL (ref 0.3–0.9)
Total Bilirubin: 0.9 mg/dL (ref 0.3–1.2)
Total Protein: 7 g/dL (ref 6.5–8.1)

## 2018-06-16 LAB — BASIC METABOLIC PANEL
ANION GAP: 11 (ref 5–15)
BUN: 13 mg/dL (ref 6–20)
CALCIUM: 8.8 mg/dL — AB (ref 8.9–10.3)
CO2: 24 mmol/L (ref 22–32)
Chloride: 103 mmol/L (ref 98–111)
Creatinine, Ser: 1.23 mg/dL (ref 0.61–1.24)
GFR calc Af Amer: >60 mL/min (ref >60)
GLUCOSE: 99 mg/dL (ref 70–99)
Potassium: 3.3 mmol/L — ABNORMAL LOW (ref 3.5–5.1)
Sodium: 138 mmol/L (ref 135–145)

## 2018-06-16 LAB — CBC
HCT: 45.2 % (ref 39.0–52.0)
Hemoglobin: 15.4 g/dL (ref 13.0–17.0)
MCH: 29.4 pg (ref 26.0–34.0)
MCHC: 34.1 g/dL (ref 30.0–36.0)
MCV: 86.4 fL (ref 80.0–100.0)
NRBC: 0 % (ref 0.0–0.2)
PLATELETS: 229 10*3/uL (ref 150–400)
RBC: 5.23 MIL/uL (ref 4.22–5.81)
RDW: 11.2 % — AB (ref 11.5–15.5)
WBC: 6.6 10*3/uL (ref 4.0–10.5)

## 2018-06-16 MED ORDER — NAPROXEN 250 MG PO TABS
500.0000 mg | ORAL_TABLET | Freq: Two times a day (BID) | ORAL | Status: DC
  Administered 2018-06-17: 500 mg via ORAL
  Filled 2018-06-16: qty 2

## 2018-06-16 MED ORDER — PROPOFOL 10 MG/ML IV BOLUS
0.5000 mg/kg | Freq: Once | INTRAVENOUS | Status: AC
  Administered 2018-06-16: 40.8 mg via INTRAVENOUS
  Filled 2018-06-16: qty 20

## 2018-06-16 MED ORDER — POTASSIUM CHLORIDE CRYS ER 20 MEQ PO TBCR
40.0000 meq | EXTENDED_RELEASE_TABLET | Freq: Once | ORAL | Status: AC
  Administered 2018-06-17: 40 meq via ORAL
  Filled 2018-06-16: qty 2

## 2018-06-16 MED ORDER — PROPOFOL 10 MG/ML IV BOLUS
INTRAVENOUS | Status: AC | PRN
  Administered 2018-06-16: 40.8 mg via INTRAVENOUS

## 2018-06-16 MED ORDER — OXYCODONE-ACETAMINOPHEN 7.5-325 MG PO TABS
1.0000 | ORAL_TABLET | Freq: Four times a day (QID) | ORAL | Status: DC | PRN
  Administered 2018-06-16: 1 via ORAL
  Filled 2018-06-16: qty 1

## 2018-06-16 MED ORDER — LIDOCAINE-EPINEPHRINE (PF) 2 %-1:200000 IJ SOLN
10.0000 mL | Freq: Once | INTRAMUSCULAR | Status: AC
  Administered 2018-06-16: 10 mL via INTRADERMAL
  Filled 2018-06-16: qty 20

## 2018-06-16 MED ORDER — ENOXAPARIN SODIUM 40 MG/0.4ML ~~LOC~~ SOLN
40.0000 mg | SUBCUTANEOUS | Status: DC
  Administered 2018-06-17 – 2018-07-02 (×14): 40 mg via SUBCUTANEOUS
  Filled 2018-06-16 (×15): qty 0.4

## 2018-06-16 MED ORDER — MORPHINE SULFATE (PF) 4 MG/ML IV SOLN
4.0000 mg | Freq: Once | INTRAVENOUS | Status: AC
  Administered 2018-06-16: 4 mg via INTRAVENOUS
  Filled 2018-06-16: qty 1

## 2018-06-16 NOTE — H&P (Signed)
Date: 06/16/2018               Patient Name:  Timothy Horn MRN: [id number]  DOB: [birth year] Age / Sex: 33 y.o., male   PCP: Patient, No Pcp Per         Medical Service: Internal Medicine Teaching Service         Attending Physician: Dr. Heber Holcomb, Rachel Moulds, DO    First Contact: Dr. Koleen Distance Pager: [phone]  Second Contact: Dr. Frederico Hamman Pager: [phone]       After Hours (After 5p/  First Contact Pager: [phone]  weekends / holidays): Second Contact Pager: [phone]   Chief Complaint: chest pain  History of Present Illness:  [name] with exercise induced asthma who presents with 2 weeks of worsening right sided chest pain and cough after weight lifting. He notes feeling something pop 2 weeks ago while doing overhead triceps pulls but just thought it was a strained muscle since it felt similar to a previous injury of his right lateral chest. The pain got better with advil but his cough and shortness of breath worsened so he came to the ED. He describes not being able to lay on his right side or lay flat on his back due to cough and worsening sob. "It felt like I was having an asthma attack just on my right side." He denies history or family history of pneumothoraces, no previous surgical history, and no other trauma to the chest wall.   CXR showed near complete collapse of right lung. Chest tube was inserted with resolution of pneumothorax and sob and cough also improved. Chest tube remains on suction and now he is only endorsing pain around chest tube insertion site.    Meds: advil prn  No outpatient medications have been marked as taking for the 06/16/18 encounter [facility] Encounter).     Allergies: Allergies as of 06/16/2018 - Review Complete 06/16/2018  Allergen Reaction Noted  . Ativan [lorazepam] Anxiety 11/30/2014  . Paxil [paroxetine hcl] Anxiety 11/30/2014   Past Medical History:  Diagnosis Date  . Anxiety   . Asthma     Family History:  Family History  Problem  Relation Age of Onset  . Mental illness Sister   . Hyperlipidemia Maternal Grandmother     Social History: Lives with wife. Owns a night club. Never smoker. Occasional alcohol. No illicit drugs  Review of Systems: A complete ROS was negative except as per HPI.   Physical Exam: Blood pressure 120/75, pulse 88, temperature 98.1 F (36.7 C), temperature source Oral, resp. rate (!) 22, height 5' 9"  (1.753 m), weight 81.6 kg, SpO2 100 %. Vitals:   06/16/18 1926 06/16/18 1930 06/16/18 2000 06/16/18 2030  BP: 110/70 105/65 108/73 120/75  Pulse: 84 79 79 88  Resp: (!) 23 (!) 22 19 (!) 22  Temp:      TempSrc:      SpO2: 99% 100% 100% 100%  Weight:      Height:       General: Vital signs reviewed.  Patient is well-developed and well-nourished, in no acute distress and cooperative with exam.  Head: Normocephalic and atraumatic. Eyes: EOMI, conjunctivae normal, no scleral icterus.  Neck: Supple, trachea midline, normal ROM, no JVD Cardiovascular: RRR, S1 normal, S2 normal, no murmurs, gallops, or rubs. Pulmonary/Chest: Right chest tube in place, bandage is clean and dry. Clear to auscultation bilaterally, no wheezes, rales, or rhonchi. Abdominal: Soft, non-tender, non-distended, BS +, no masses, organomegaly, or guarding present.  Musculoskeletal: No joint deformities, erythema, or stiffness, ROM full and nontender. Extremities: No lower extremity edema bilaterally,  pulses symmetric and intact bilaterally. No cyanosis or clubbing. Neurological: A&O x3, Strength is normal and symmetric bilaterally, cranial nerve II-XII are grossly intact, no focal motor deficit, sensory intact to light touch bilaterally.  Psychiatric: Normal mood and affect. speech and behavior is normal. Cognition and memory are normal.   Labs:  K 3.3 Ca 8.8 Creatine 1.23  CBC WNLs  CXR: Cardiac shadow is within normal limits. The left lung is clear. Right lung is nearly completely collapsed with only minimal  aeration identified consistent with large pneumothorax. A small pleural effusion is noted as well. No significant mediastinal shift is Noted  CXR post chest tube: Resolved right pneumothorax after placement of right pigtail catheter. Right infrahilar atelectasis.  Assessment & Plan by Problem: Active Problems:   Pneumothorax  [name] with exercise induced asthma who presents with 2 weeks of worsening right sided chest pain and cough after weight lifting, found to have acute spontaneous pneumothorax of the right lung that has resolved s/p chest tube placement.   Spontaneous Pneumothorax, right: tachypneic on presentation but never hypoxic. SOB, cough and pneumothorax resolved after placement of pigtail catheter. Endorsing pain around chest tube insertion site. No clear etiology for pneumothorax. Does have exercise induced asthma which could correlate with alpha-1-antitrypsin deficiency. LFTs in care everywhere from 2016 show AST 27, ALT 69, T bili 0.6, Alk phos 31. Denies family history of pulmonary or liver disease.  - PCCM consulted, appreciate their assistance with chest tube management.  - recheck LFTs - CT chest w/o contrast to evaluate for etiology of pneumo. If basilar bullae are seen, consider testing for alpha-1-antitrypsin def.  - scheduled naproxen BID, PRN oxycodone-acetaminophen for breakthrough pain   DVT ppx: Lovenox Code: full FEN/GI: replete electrolytes prn, no IVFs, Regular diet   Dispo: Admit patient to Inpatient with expected length of stay greater than 2 midnights.  Signed: Isabelle Course, MD 06/16/2018, 9:14 PM  Pager: [phone]

## 2018-06-16 NOTE — Consult Note (Signed)
Name: Timothy Horn MRN: [id number] DOB: 04/15/1986    LOS: 0  Referring Provider:  Petra KubaVogel  Reason for Referral:  Chest tube management   PULMONARY / CRITICAL CARE MEDICINE CONSULT  HPI:  Timothy Horn is a 33 y.o. male with only pertinent past medical history of intermittent asthma who present after two weeks worth of progressive right sided chest pain associated with positional SOB that eventually became so severe he came to our ED and was evaluated with CXR confirming significant pneumothorax. The patient symptoms began after he was weight lifting in a gym, he described felling a sharp pain in his side but denied initial respiratory issues or cough. Over the next couple weeks his symptoms became insidiously worse with severe SOB when laying on his right side at night. He denies any previous episodes of pneumothorax, childhood pneumonias, or previous trauma to the chest wall. A chest tube was placed in the ED and repeat imaging revealed re-expansion of the lung without any residual fluid or pneumothorax present. Patient remains on suction and has improved symptoms with some residual pain at the insertion site. We have been consulted for chest tube management going forward.   Past Medical History:  Diagnosis Date  . Anxiety   . Asthma    History reviewed. No pertinent surgical history. Prior to Admission medications   Medication Sig Start Date End Date Taking? Authorizing Provider  albuterol (PROVENTIL HFA;VENTOLIN HFA) 108 (90 Base) MCG/ACT inhaler Inhale 2 puffs into the lungs every 4 (four) hours as needed for wheezing or shortness of breath. 06/17/16   Horton, Mayer Maskerourtney F, MD  loratadine-pseudoephedrine (CLARITIN-D 24-HOUR) 10-240 MG 24 hr tablet Take 1 tablet by mouth daily as needed for allergies.     [provider]  Multiple Vitamin (MULTIVITAMIN WITH MINERALS) TABS tablet Take 1 tablet by mouth daily.    [provider]  PARoxetine (PAXIL) 20 MG tablet Take 1  tablet (20 mg total) by mouth daily. Patient not taking: Reported on 11/30/2014 11/27/14 08/13/15  Carmelina DaneAnderson, Jeffery S, MD  sertraline (ZOLOFT) 50 MG tablet Take 1 tablet (50 mg total) by mouth daily. Patient not taking: Reported on 11/30/2014 11/29/14 08/13/15  Carmelina DaneAnderson, Jeffery S, MD   Allergies Allergies  Allergen Reactions  . Ativan [Lorazepam] Anxiety    Appetite was gone, shaky   . Paxil [Paroxetine Hcl] Anxiety    Dizziness, insomnia    Family History Family History  Problem Relation Age of Onset  . Mental illness Sister   . Hyperlipidemia Maternal Grandmother    Social History  reports that he has never smoked. He has never used smokeless tobacco. He reports current alcohol use. He reports that he does not use drugs.  Review Of Systems:  Complete review of systems conducted with pertinent positives included in HPI.   Brief patient description:  33 yo M with intermittent asthma who presented with acute spontaneous pneumothorax to the right lung s/p chest tube placement and resolution.    Current Status:  Vital Signs: Temp:  [98.1 F (36.7 C)] 98.1 F (36.7 C) (01/26 1815) Pulse Rate:  [79-95] 88 (01/26 2030) Resp:  [18-30] 22 (01/26 2030) BP: (105-137)/(65-78) 120/75 (01/26 2030) SpO2:  [99 %-100 %] 100 % (01/26 2030) Weight:  [81.6 kg] 81.6 kg (01/26 1815)  Physical Examination: General:  Patient resting comfortably in bed with supplemental O2 in place via nasal cannula  Neuro:  No gross deficits  HEENT:  MMM, no LAD, PERRL Neck:  Normal circumference, no JVD Cardiovascular:  RRR, no m/r/g Lungs:  Good air movement bilaterally, chest tube on right lateral thorax attached to bedside atrium on suction and with intermittent air leak with forceful coughing.  Abdomen:  Soft, non-tender. Normal active bowel sounds.  Musculoskeletal:  No acute abnormalities of note, no edema  Skin:  No lesions or rashes identified.   Active Problems:   Pneumothorax   ASSESSMENT AND  PLAN  PULMONARY CXR:   Post chest tube placement with resolution of pneumothroax   Right sided pneumothorax with almost complete collage of all lobes.    A: Patient presented with spontaneous pneumothorax after lifting at the gym two weeks ago. Denied any trauma or previous episodes. Chest tube placed in ED with resolution to the right lung while maintained on suction. Only on minimal supplemental oxygen via nasal cannula.  P:   -Continue suction at -20cmH20 with anticipation of transitioning to water seal after 8-12hrs for additional monitoring and repeat CXR 6 hours after suction discontinued.  -Maintain SpO2 92-96% with titration of supplemental oxygen via nasal cannula -Recommend HRCT thorax without contrast to assess for pulmonary architecture and to document any bullous lesions or abnormalities that could've precipitated PTX -Pain control for insertion site discomfort with BID naproxen and PRN tylenol for breakthrough pain -Please page Pulmonary consult service if patient has any of the following red flag changes: acute severe chest pain, abrupt drop in oxygen saturation, or if any changes to the atrium (continuous air leak or accumulation of serosanguinous fluid in chamber).   Thank you for the interesting consult, and we will continue to follow with you in care of the patient.   BEST PRACTICE / DISPOSITION Level of Care:  Floor Primary Service:  Internal medicine  Consultants:  Pulmonary  Code Status:  Full  Diet:  Full DVT Px:  SCDs GI Px:  N/A Skin Integrity:  Appropriate  Social / Family:  Updated.   Aida Puffer, M.D. Pulmonary and Critical Care Medicine [facility] Pager: [phone]  06/16/2018, 8:50 PM

## 2018-06-16 NOTE — ED Notes (Signed)
Pt back from X-ray.  

## 2018-06-16 NOTE — ED Provider Notes (Signed)
[facility] EMERGENCY DEPARTMENT Provider Note   CSN: [id number] Arrival date & time: 06/16/18  1809     History   Chief Complaint Chief Complaint  Patient presents with  . Cough  . Shortness of Breath    HPI Timothy Horn is a 33 y.o. male.  HPI  33 year old male presents today complaining of right-sided chest pain and cough.  He states 2 weeks ago he pulled to the right side and felt some pain in that area.  Since that time he has been having some increased congestion coughing and dyspnea.  He denies any fever.  Cough has been productive of some clear sputum.  He had some similar pain in his chest one time in the past.  Pain is moderate to severe.  He has not sought previous medical care for this.  It is worse with deep breathing.  He has not taken any medication.  Past Medical History:  Diagnosis Date  . Anxiety   . Asthma     Patient Active Problem List   Diagnosis Date Noted  . DYSURIA 04/09/2009    History reviewed. No pertinent surgical history.      Home Medications    Prior to Admission medications   Medication Sig Start Date End Date Taking? Authorizing Provider  albuterol (PROVENTIL HFA;VENTOLIN HFA) 108 (90 Base) MCG/ACT inhaler Inhale 2 puffs into the lungs every 4 (four) hours as needed for wheezing or shortness of breath. 06/17/16   Horton, Mayer Masker, MD  loratadine-pseudoephedrine (CLARITIN-D 24-HOUR) 10-240 MG 24 hr tablet Take 1 tablet by mouth daily as needed for allergies.     [provider]  Multiple Vitamin (MULTIVITAMIN WITH MINERALS) TABS tablet Take 1 tablet by mouth daily.    [provider]  PARoxetine (PAXIL) 20 MG tablet Take 1 tablet (20 mg total) by mouth daily. Patient not taking: Reported on 11/30/2014 11/27/14 08/13/15  Carmelina Dane, MD  sertraline (ZOLOFT) 50 MG tablet Take 1 tablet (50 mg total) by mouth daily. Patient not taking: Reported on 11/30/2014 11/29/14 08/13/15  Carmelina Dane,  MD    Family History Family History  Problem Relation Age of Onset  . Mental illness Sister   . Hyperlipidemia Maternal Grandmother     Social History Social History   Tobacco Use  . Smoking status: Never Smoker  . Smokeless tobacco: Never Used  Substance Use Topics  . Alcohol use: Yes  . Drug use: No     Allergies   Ativan [lorazepam] and Paxil [paroxetine hcl]   Review of Systems Review of Systems  All other systems reviewed and are negative.    Physical Exam Updated Vital Signs BP 110/70   Pulse 84   Temp 98.1 F (36.7 C) (Oral)   Resp (!) 23   Ht 1.753 m (5\' 9" )   Wt 81.6 kg   SpO2 99%   BMI 26.58 kg/m   Physical Exam Vitals signs and nursing note reviewed.  Constitutional:      General: He is not in acute distress.    Appearance: He is well-developed and normal weight. He is not ill-appearing.  HENT:     Head: Normocephalic and atraumatic.     Mouth/Throat:     Mouth: Mucous membranes are moist.  Eyes:     Pupils: Pupils are equal, round, and reactive to light.  Neck:     Musculoskeletal: Normal range of motion.  Cardiovascular:     Rate and Rhythm: Normal rate and  regular rhythm.  Pulmonary:     Effort: Pulmonary effort is normal.     Breath sounds: Examination of the right-upper field reveals decreased breath sounds. Examination of the right-middle field reveals decreased breath sounds. Examination of the right-lower field reveals decreased breath sounds. Decreased breath sounds present. No wheezing, rhonchi or rales.  Chest:     Chest wall: No mass or deformity.  Musculoskeletal: Normal range of motion.  Skin:    General: Skin is warm and dry.     Capillary Refill: Capillary refill takes less than 2 seconds.  Neurological:     General: No focal deficit present.     Mental Status: He is alert.  Psychiatric:        Mood and Affect: Mood normal.        Behavior: Behavior normal.      ED Treatments / Results  Labs (all labs ordered  are listed, but only abnormal results are displayed) Labs Reviewed  CBC  BASIC METABOLIC PANEL    EKG None  Radiology Dg Chest 2 View  Result Date: 06/16/2018 CLINICAL DATA:  Right-sided chest pain and cough for several days EXAM: CHEST - 2 VIEW COMPARISON:  06/17/2016 FINDINGS: Cardiac shadow is within normal limits. The left lung is clear. Right lung is nearly completely collapsed with only minimal aeration identified consistent with large pneumothorax. A small pleural effusion is noted as well. No significant mediastinal shift is noted. IMPRESSION: Large right hydropneumothorax without significant mediastinal shift. Critical Value/emergent results were called by telephone at the time of interpretation on 06/16/2018 at 6:32 pm to Dr. Margarita GrizzleANIELLE Verneice Caspers , who verbally acknowledged these results. Electronically Signed   By: Alcide CleverMark  Lukens M.D.   On: 06/16/2018 18:34   Dg Chest Portable 1 View  Result Date: 06/16/2018 CLINICAL DATA:  Pneumothorax, chest tube placement. EXAM: PORTABLE CHEST 1 VIEW COMPARISON:  Radiograph earlier this day 1 hour prior. FINDINGS: Placement of right pigtail catheter on the right with tip centrally in the upper hemithorax. No residual pneumothorax visualized. Right infrahilar atelectasis. Normal heart size and mediastinal contours. Left lung is clear. IMPRESSION: Resolved right pneumothorax after placement of right pigtail catheter. Right infrahilar atelectasis. Electronically Signed   By: Narda RutherfordMelanie  Sanford M.D.   On: 06/16/2018 19:48    Procedures .Sedation Date/Time: 06/16/2018 7:55 PM Performed by: Margarita Grizzleay, Jennier Schissler, MD Authorized by: Margarita Grizzleay, Larone Kliethermes, MD   Consent:    Consent obtained:  Verbal   Consent given by:  Patient   Risks discussed:  Allergic reaction, dysrhythmia, inadequate sedation, nausea, prolonged hypoxia resulting in organ damage, prolonged sedation necessitating reversal, respiratory compromise necessitating ventilatory assistance and intubation and  vomiting   Alternatives discussed:  Analgesia without sedation, anxiolysis and regional anesthesia Universal protocol:    Procedure explained and questions answered to patient or proxy's satisfaction: yes     Relevant documents present and verified: yes     Test results available and properly labeled: yes     Imaging studies available: yes     Required blood products, implants, devices, and special equipment available: yes     Site/side marked: yes     Immediately prior to procedure a time out was called: yes     Patient identity confirmation method:  Verbally with patient Indications:    Procedure necessitating sedation performed by:  Physician performing sedation Pre-sedation assessment:    Time since last food or drink:  1   ASA classification: class 1 - normal, healthy patient     Neck  mobility: normal     Mouth opening:  3 or more finger widths   Thyromental distance:  4 finger widths   Mallampati score:  I - soft palate, uvula, fauces, pillars visible   Pre-sedation assessments completed and reviewed: airway patency, cardiovascular function, hydration status, mental status, nausea/vomiting, pain level, respiratory function and temperature     Pre-sedation assessment completed:  06/16/2018 7:15 PM Immediate pre-procedure details:    Reassessment: Patient reassessed immediately prior to procedure     Reviewed: vital signs, relevant labs/tests and NPO status     Verified: bag valve mask available, emergency equipment available, intubation equipment available, IV patency confirmed, oxygen available and suction available   Procedure details (see MAR for exact dosages):    Preoxygenation:  Nasal cannula   Sedation:  Propofol   Intra-procedure monitoring:  Blood pressure monitoring, cardiac monitor, continuous pulse oximetry, frequent LOC assessments, frequent vital sign checks and continuous capnometry   Intra-procedure events: none     Total Provider sedation time (minutes):   15 Post-procedure details:    Post-sedation assessment completed:  06/16/2018 7:57 PM   Attendance: Constant attendance by certified staff until patient recovered     Recovery: Patient returned to pre-procedure baseline     Post-sedation assessments completed and reviewed: airway patency, cardiovascular function, hydration status, mental status, nausea/vomiting, pain level, respiratory function and temperature     Patient is stable for discharge or admission: yes     Patient tolerance:  Tolerated well, no immediate complications CHEST TUBE INSERTION Date/Time: 06/16/2018 7:57 PM Performed by: Margarita Grizzle, MD Authorized by: Margarita Grizzle, MD   Consent:    Consent obtained:  Written   Consent given by:  Patient   Risks discussed:  Bleeding, incomplete drainage, nerve damage and damage to surrounding structures   Alternatives discussed:  No treatment Pre-procedure details:    Skin preparation:  ChloraPrep   Preparation: Patient was prepped and draped in the usual sterile fashion   Sedation:    Sedation type:  Moderate (conscious) sedation Anesthesia (see MAR for exact dosages):    Anesthesia method:  Local infiltration   Local anesthetic:  Lidocaine 1% WITH epi Procedure details:    Placement location:  R lateral   Scalpel size:  11   Ultrasound guidance: no     Tension pneumothorax: no     Tube connected to:  Suction   Drainage characteristics:  Serosanguinous   Suture material:  0 silk   Dressing:  4x4 sterile gauze and Xeroform gauze Post-procedure details:    Post-insertion x-Omunique Pederson findings: tube repositioned     Patient tolerance of procedure:  Tolerated well, no immediate complications   (including critical care time)  Medications Ordered in ED Medications  lidocaine-EPINEPHrine (XYLOCAINE W/EPI) 2 %-1:200000 (PF) injection 10 mL (10 mLs Intradermal Given 06/16/18 1902)  propofol (DIPRIVAN) 10 mg/mL bolus/IV push 40.8 mg (40.8 mg Intravenous Given 06/16/18 1910)  morphine  4 MG/ML injection 4 mg (4 mg Intravenous Given 06/16/18 1926)     Initial Impression / Assessment and Plan / ED Course  I have reviewed the triage vital signs and the nursing notes.  Pertinent labs & imaging results that were available during my care of the patient were reviewed by me and considered in my medical decision making (see chart for details).   Discussed with critical care and will consult for chest tube management  Discussed with Dr. Frances Furbish and will see for admission Final Clinical Impressions(s) / ED Diagnoses   Final diagnoses:  Pneumothorax on right    ED Discharge Orders    None       Margarita Grizzleay, Charity Tessier, MD 06/16/18 2000

## 2018-06-16 NOTE — ED Triage Notes (Signed)
Pt presents with c/o cough, SOB, and persistent rib pain following an injury x2 weeks ago. Pt states he is unable to lay on his left side due to the pain.

## 2018-06-17 ENCOUNTER — Inpatient Hospital Stay (HOSPITAL_COMMUNITY): Payer: Self-pay

## 2018-06-17 ENCOUNTER — Encounter (HOSPITAL_COMMUNITY): Payer: Self-pay

## 2018-06-17 ENCOUNTER — Other Ambulatory Visit: Payer: Self-pay

## 2018-06-17 DIAGNOSIS — Z9689 Presence of other specified functional implants: Secondary | ICD-10-CM

## 2018-06-17 DIAGNOSIS — J939 Pneumothorax, unspecified: Secondary | ICD-10-CM

## 2018-06-17 LAB — CBC
HEMATOCRIT: 45.6 % (ref 39.0–52.0)
Hemoglobin: 15.9 g/dL (ref 13.0–17.0)
MCH: 30.2 pg (ref 26.0–34.0)
MCHC: 34.9 g/dL (ref 30.0–36.0)
MCV: 86.7 fL (ref 80.0–100.0)
Platelets: 213 10*3/uL (ref 150–400)
RBC: 5.26 MIL/uL (ref 4.22–5.81)
RDW: 11.2 % — ABNORMAL LOW (ref 11.5–15.5)
WBC: 10.5 10*3/uL (ref 4.0–10.5)
nRBC: 0 % (ref 0.0–0.2)

## 2018-06-17 MED ORDER — IBUPROFEN 200 MG PO TABS
400.0000 mg | ORAL_TABLET | Freq: Four times a day (QID) | ORAL | Status: DC | PRN
  Administered 2018-06-17 – 2018-07-01 (×6): 400 mg via ORAL
  Filled 2018-06-17 (×6): qty 2

## 2018-06-17 MED ORDER — MORPHINE SULFATE (PF) 2 MG/ML IV SOLN: 2.0000 mg | INTRAVENOUS | Status: DC | PRN

## 2018-06-17 MED ORDER — GABAPENTIN 100 MG PO CAPS
200.0000 mg | ORAL_CAPSULE | Freq: Two times a day (BID) | ORAL | Status: DC
  Administered 2018-06-17 – 2018-07-02 (×28): 200 mg via ORAL
  Filled 2018-06-17 (×29): qty 2

## 2018-06-17 MED ORDER — HYDROMORPHONE HCL 1 MG/ML IJ SOLN
0.5000 mg | Freq: Once | INTRAMUSCULAR | Status: AC
  Administered 2018-06-17: 0.5 mg via INTRAVENOUS
  Filled 2018-06-17: qty 1

## 2018-06-17 MED ORDER — HYDROMORPHONE HCL 1 MG/ML IJ SOLN
1.0000 mg | INTRAMUSCULAR | Status: DC | PRN
  Administered 2018-06-17 – 2018-06-26 (×41): 1 mg via INTRAVENOUS
  Filled 2018-06-17 (×40): qty 1

## 2018-06-17 MED ORDER — KETOROLAC TROMETHAMINE 30 MG/ML IJ SOLN
15.0000 mg | Freq: Once | INTRAMUSCULAR | Status: AC
  Administered 2018-06-17: 15 mg via INTRAVENOUS
  Filled 2018-06-17: qty 1

## 2018-06-17 MED ORDER — MORPHINE SULFATE (PF) 2 MG/ML IV SOLN: 2.0000 mg | Freq: Once | INTRAVENOUS | Status: DC

## 2018-06-17 MED ORDER — HYDROMORPHONE HCL 1 MG/ML IJ SOLN
0.5000 mg | INTRAMUSCULAR | Status: DC | PRN
  Administered 2018-06-17 (×3): 0.5 mg via INTRAVENOUS
  Filled 2018-06-17 (×3): qty 1

## 2018-06-17 NOTE — ED Notes (Addendum)
Attempted report to [facility]. Took my number and will call me back . States that charge RN is looking at pt chart now. Pt requesting something more for pain. Pt had oxycodone at 2049. Not due for another until 91. MD paged to request additional pain medication.

## 2018-06-17 NOTE — Progress Notes (Addendum)
Called into pt room. Pt states his breathing feels "like when I came in". Breath sounds auscultated in the R lung. CT remains to 20 mmHg of suction. O2 98% on room air. Attending team paged. They called back and said they would come see the patient. Pt updated.   Leonidas Romberg, RN

## 2018-06-17 NOTE — Progress Notes (Signed)
Patient was evaluated at bedside for complaints of sob. "It feels like when I came in." He did not appear to be in any acute respiratory distress. He was endorsing continued significant pain around the chest tube site and a burning pain radiating up his right chest wall. He has been receiving dilaudid 0.5mg  q4h and states that helps for the first hour and then the pain starts coming back and gets severe again by the time he's due for his next dose.   43F, HR 75, RR 16, BP 123/72, O2 98% RA Gen: NAD, laying in bed Pulm: speaking full sentences without difficulty. lung sounds present bilaterally, shallow breathing secondary to pain. Chest tube remains to suction and bandages are clean and dry.  POCUS: normal lung sliding seen on anterior chest in the 2nd-3rd intercostal space, along the mid-clavicular line.  A/P: He seems to be taking shallow breaths 2/2 increased pain which is making him feel sob. Pulmonary ascultation and bedside ultrasound do not show any indication of recurrent pneumothorax.   - increase dilaudid to 1mg  q4h prn - start gabapentin 200mg  bid   Maryelizabeth Kaufmann, MD Internal Medicine, PGY1 Pager: [phone] 06/17/2018, 8:44 PM

## 2018-06-17 NOTE — Progress Notes (Signed)
I responded to a [place] to provide Advance Directive information for the patient. I visited the patient's room with his wife present. I shared an overview of the AD and left a copy for them to complete at a later time. I shared that the Chaplain is available for additional support as needed or requested.    06/17/18 1500  Clinical Encounter Type  Visited With Patient and family together  Visit Type Follow-up;Spiritual support  Referral From Nurse  Consult/Referral To Chaplain  Spiritual Encounters  Spiritual Needs Literature;Prayer  Stress Factors  Patient Stress Factors Exhausted  Family Stress Factors None identified    Chaplain Dr Melvyn Novas

## 2018-06-17 NOTE — Progress Notes (Addendum)
NAME:  Timothy Horn, MRN:  [id number], DOB:  07-16-1985, LOS: 1 ADMISSION DATE:  06/16/2018, CONSULTATION DATE:  1/26 REFERRING MD:  Dr. Petra Kuba, CHIEF COMPLAINT:  PTX   Brief History   33 y/o M admitted 1/26 with 2 week hx of progressive right sided chest pain with associated SOB.  CXR on admit confirmed pneumothorax.  Chest tube placed in ER  Past Medical History  Asthma  Anxiety   Significant Hospital Events   1/26  Admit with right pneumothorax   Consults:  1/26 PCCM   Procedures:  Right Chest Tube 1/26 >>   Significant Diagnostic Tests:  CXR 1/26 >> large right pneumothorax without mediastinal shift  HRCT 1/26 >> small right PTX with small bore catheter terminating in the anterior right hemithorax, volume loss & rounded consolidation in the RML, dependent consolidation with surrounding GGO in RLL, no obvious bullae or blebs.    Micro Data:    Antimicrobials:     Interim history/subjective:  Pt reports ongoing right sided chest discomfort.  States naproxyn made him nauseated.  Dilaudid helps with pain.   Objective   Blood pressure 105/70, pulse 62, temperature 98.1 F (36.7 C), resp. rate 16, height 5\' 9"  (1.753 m), weight 86.2 kg, SpO2 100 %.        Intake/Output Summary (Last 24 hours) at 06/17/2018 1018 Last data filed at 06/17/2018 0459 Gross per 24 hour  Intake -  Output 0 ml  Net 0 ml   Filed Weights   06/16/18 1815 06/17/18 0134  Weight: 81.6 kg 86.2 kg    Examination: General: young adult male in NAD lying in bed  HEENT: MM pink/moist, [place] O2 Neuro: AAOx4, speech clear, MAE, non-focal exam  CV: s1s2 rrr, no m/r/g PULM: even/non-labored, clear, right chest tube in place c/d/i, 1/7 air leak noted XH:FSFS, non-tender, bsx4 active  Extremities: warm/dry, no edema  Skin: no rashes or lesions  Resolved Hospital Problem list      Assessment & Plan:   Large Right Pneumothorax  -spontaneous, s/p chest tube in ER -pt denies smoking, vaping, no  recent surgeries -thinks he felt it happen two weeks prior to admit while working out P: Follow intermittent CXR, repeat now and in am  Chest tube to 20 cm suction until air leak resolved  Consider water seal in am with repeat CXR if lung remains up / no leak Assess UDS   RML Rounded / Dependent Consolidation, RLL GGO -suspect atelectasis / volume loss with pneumothorax P: Follow intermittent CXR  No evidence of infectious etiology.  Afebrile, no leukocytosis > hold abx for now Incentive spirometry, OOB to chair on suction Consider follow up CT imaging as outpatient to ensure resolution   Best practice:  Diet: regular  Pain/Anxiety/Delirium protocol (if indicated): pain control per primary  VAP protocol (if indicated): n/a DVT prophylaxis: per primary  GI prophylaxis: n/a  Glucose control: n/a Mobility: as tolerated  Code Status: full code  Family Communication: Wife, mother & patient updated on plan of care Disposition: per Teaching Service.  PCCM will continue to follow.   Labs   CBC: Recent Labs  Lab 06/16/18 1959 06/17/18 0256  WBC 6.6 10.5  HGB 15.4 15.9  HCT 45.2 45.6  MCV 86.4 86.7  PLT 229 213    Basic Metabolic Panel: Recent Labs  Lab 06/16/18 1959  NA 138  K 3.3*  CL 103  CO2 24  GLUCOSE 99  BUN 13  CREATININE 1.23  CALCIUM 8.8*  GFR: Estimated Creatinine Clearance: 92.9 mL/min (by C-G formula based on SCr of 1.23 mg/dL). Recent Labs  Lab 06/16/18 1959 06/17/18 0256  WBC 6.6 10.5    Liver Function Tests: Recent Labs  Lab 06/16/18 1959  AST 23  ALT 23  ALKPHOS 24*  BILITOT 0.9  PROT 7.0  ALBUMIN 4.1   No results for input(s): LIPASE, AMYLASE in the last 168 hours. No results for input(s): AMMONIA in the last 168 hours.  ABG    Component Value Date/Time   TCO2 26 10/04/2015 2310     Coagulation Profile: No results for input(s): INR, PROTIME in the last 168 hours.  Cardiac Enzymes: No results for input(s): CKTOTAL, CKMB,  CKMBINDEX, TROPONINI in the last 168 hours.  HbA1C: No results found for: HGBA1C  CBG: No results for input(s): GLUCAP in the last 168 hours.  Critical care time: n/a    Canary Brim, NP-C [facility] Pgr: [phone] or if no answer [phone] 06/17/2018, 10:18 AM

## 2018-06-17 NOTE — Progress Notes (Signed)
   Subjective: Timothy Horn was seen and evaluated at bedside on morning rounds. His breathing has significantly improved. He has some soreness where the chest tube is but states it is well controlled with pain medications. He endorses some nausea which he attributes to the Naproxen. We discussed switching to Advil which he takes intermittently at home.   Objective:  Vital signs in last 24 hours: Vitals:   06/17/18 0124 06/17/18 0134 06/17/18 0627 06/17/18 0805  BP: 109/64   105/70  Pulse: 75   62  Resp: 14  17 16   Temp: 98.5 F (36.9 C)   98.1 F (36.7 C)  TempSrc: Oral     SpO2: 100%   100%  Weight:  86.2 kg    Height:  5\' 9"  (1.753 m)     General: awake, alert, lying in bed in NAD CV: RRR; no murmurs, rubs or gallops Pulm: normal respiratory effort; saturating well on 2L [place]; chest tube on right lateral chest wall on suction. Dressing clean, dry and intact   Assessment/Plan:  Active Problems:   Spontenous pneumothorax  27 yoM with exercise induced asthma who presents with 2 weeks of worsening right sided chest pain and cough after weight lifting, found to have acute spontaneous pneumothorax of the right lung that has resolved s/p chest tube placement.   1. Spontaneous right pneumothorax - appreciate pulmonology assistance with chest tube management - patient with significant improvement in respiratory symptoms; breathing comfortably on 2L [place]; continue to titrate as needed to maintain between 92-96% - will switch to Naproxen to Advil and continue Dilaudid 0.5 q4 PRN for pain control  - high rest CT did not show evidence of interstitial lung disease but read as possible consolidation in right lung concerning for PNA. Will follow-up with pulm regarding need for empiric treatment   Dispo: Anticipated discharge in approximately 2-3 day(s).   Timothy Horn D, DO 06/17/2018, 10:43 AM Pager: [phone]

## 2018-06-18 ENCOUNTER — Inpatient Hospital Stay (HOSPITAL_COMMUNITY): Payer: Self-pay

## 2018-06-18 LAB — BASIC METABOLIC PANEL
Anion gap: 10 (ref 5–15)
BUN: 11 mg/dL (ref 6–20)
CO2: 25 mmol/L (ref 22–32)
Calcium: 8.8 mg/dL — ABNORMAL LOW (ref 8.9–10.3)
Chloride: 103 mmol/L (ref 98–111)
Creatinine, Ser: 1.07 mg/dL (ref 0.61–1.24)
GFR calc Af Amer: >60 mL/min (ref >60)
GFR calc non Af Amer: >60 mL/min (ref >60)
Glucose, Bld: 89 mg/dL (ref 70–99)
Potassium: 4 mmol/L (ref 3.5–5.1)
Sodium: 138 mmol/L (ref 135–145)

## 2018-06-18 LAB — RAPID URINE DRUG SCREEN, HOSP PERFORMED
Amphetamines: NOT DETECTED
Barbiturates: NOT DETECTED
Benzodiazepines: NOT DETECTED
Cocaine: NOT DETECTED
OPIATES: POSITIVE — AB
Tetrahydrocannabinol: NOT DETECTED

## 2018-06-18 LAB — HIV ANTIBODY (ROUTINE TESTING W REFLEX): HIV Screen 4th Generation wRfx: NONREACTIVE

## 2018-06-18 NOTE — Progress Notes (Signed)
NAME:  Bascom Stamler, MRN:  [id number], DOB:  Feb 24, 1986, LOS: 2 ADMISSION DATE:  06/16/2018, CONSULTATION DATE:  1/26 REFERRING MD:  Dr. Petra Kuba, CHIEF COMPLAINT:  PTX   Brief History   33 y/o M admitted 1/26 with 2 week hx of progressive right sided chest pain with associated SOB.  CXR on admit confirmed pneumothorax.  Chest tube placed in ER  Past Medical History  Asthma  Anxiety   Significant Hospital Events   1/26  Admit with right pneumothorax  1/27  Air leak with respirations on suction 1/28  Air leak with cough  Consults:  1/26 PCCM   Procedures:  Right Chest Tube 1/26 >>   Significant Diagnostic Tests:  CXR 1/26 >> large right pneumothorax without mediastinal shift  HRCT 1/26 >> small right PTX with small bore catheter terminating in the anterior right hemithorax, volume loss & rounded consolidation in the RML, dependent consolidation with surrounding GGO in RLL, no obvious bullae or blebs.    Micro Data:    Antimicrobials:     Interim history/subjective:  Pt reports ongoing chest discomfort at chest tube site.  Small 1/7 air leak noted with coughing.  Feels better with O2 on even though he doesn't need it.  Objective   Blood pressure 117/75, pulse 63, temperature 97.7 F (36.5 C), resp. rate 15, height 5\' 9"  (1.753 m), weight 86.2 kg, SpO2 100 %.        Intake/Output Summary (Last 24 hours) at 06/18/2018 0817 Last data filed at 06/18/2018 0500 Gross per 24 hour  Intake 480 ml  Output 1300 ml  Net -820 ml   Filed Weights   06/16/18 1815 06/17/18 0134  Weight: 81.6 kg 86.2 kg    Examination: General: healthy appearing adult male lying in bed in NAD  HEENT: MM pink/moist, good dentition Neuro: AAOx4, speech clear, MAE  CV: s1s2 rrr, no m/r/g PULM: even/non-labored, lungs bilaterally clear, right sided chest tube re-dressed, connections on tubing loose/secured.  Small 1/7 air leak with coughing BX:IDHW, non-tender, bsx4 active  Extremities: warm/dry,  no edema  Skin: no rashes or lesions  Resolved Hospital Problem list      Assessment & Plan:   Large Right Pneumothorax  -spontaneous, s/p chest tube in ER -pt denies smoking, vaping, no recent surgeries -thinks he felt it happen two weeks prior to admit while working out P: Chest tube to water seal, repeat CXR in 4 hours  Follow up imaging in am    RML Rounded / Dependent Consolidation, RLL GGO -suspect atelectasis / volume loss with pneumothorax P: Likely atelectasis from lung being down for a while, no evidence of infectious etiology  Follow CXR  OOB to chair, mobilize as able    Best practice:  Diet: regular  Pain/Anxiety/Delirium protocol (if indicated): pain control per primary  VAP protocol (if indicated): n/a DVT prophylaxis: per primary  GI prophylaxis: n/a  Glucose control: n/a Mobility: as tolerated  Code Status: full code  Family Communication: Wife and patient updated on plan of care.  Disposition: per Teaching Service.  PCCM will continue to follow.   Labs   CBC: Recent Labs  Lab 06/16/18 1959 06/17/18 0256  WBC 6.6 10.5  HGB 15.4 15.9  HCT 45.2 45.6  MCV 86.4 86.7  PLT 229 213    Basic Metabolic Panel: Recent Labs  Lab 06/16/18 1959 06/18/18 0230  NA 138 138  K 3.3* 4.0  CL 103 103  CO2 24 25  GLUCOSE 99 89  BUN 13 11  CREATININE 1.23 1.07  CALCIUM 8.8* 8.8*   GFR: Estimated Creatinine Clearance: 106.8 mL/min (by C-G formula based on SCr of 1.07 mg/dL). Recent Labs  Lab 06/16/18 1959 06/17/18 0256  WBC 6.6 10.5    Liver Function Tests: Recent Labs  Lab 06/16/18 1959  AST 23  ALT 23  ALKPHOS 24*  BILITOT 0.9  PROT 7.0  ALBUMIN 4.1   No results for input(s): LIPASE, AMYLASE in the last 168 hours. No results for input(s): AMMONIA in the last 168 hours.  ABG    Component Value Date/Time   TCO2 26 10/04/2015 2310     Coagulation Profile: No results for input(s): INR, PROTIME in the last 168 hours.  Cardiac  Enzymes: No results for input(s): CKTOTAL, CKMB, CKMBINDEX, TROPONINI in the last 168 hours.  HbA1C: No results found for: HGBA1C  CBG: No results for input(s): GLUCAP in the last 168 hours.  Critical care time: n/a    Canary Brim, NP-C [facility] Pgr: [phone] or if no answer [phone] 06/18/2018, 8:17 AM

## 2018-06-18 NOTE — Progress Notes (Signed)
   Subjective: Timothy Horn was seen and evaluated at bedside. Overnight, he had complaints of shortness of breath. Night team evaluated, and he still had equal breath sounds bilaterally. Also confirmed normal lung sliding with POCUS. He was taking more shallow breath secondary to pain so analgesic regimen was increased.   This morning he states that his shortness of breath has improved significantly with increasing pain medication dosage.  He denies any other acute complaints.  Objective:  Vital signs in last 24 hours: Vitals:   06/17/18 0805 06/17/18 1651 06/17/18 2252 06/18/18 0814  BP: 105/70 123/72 137/74 117/75  Pulse: 62 75 64 63  Resp: 16 16 15    Temp: 98.1 F (36.7 C) 98 F (36.7 C) (!) 97.4 F (36.3 C) 97.7 F (36.5 C)  TempSrc:   Oral   SpO2: 100%  98% 100%  Weight:      Height:       General: awake, alert, lying in bed in NAD CV: RRR;  Pulm: chest tube water-sealed; bandages clean and dry. oxygen saturations normal on 2 L nasal cannula  Assessment/Plan:  Active Problems:   Spontenous pneumothorax of right lung   Chest tube in place  32 yoM withexercise induced asthmawho presents with 2 weeks of worsening right sided chest pain and cough after weight lifting, found to have acute spontaneous pneumothorax of the right lung that has resolved s/p chest tube placement.  1. Spontaneous right pneumothorax s/p chest tube placement: - appreciate pulmonology assistance -Chest tube now water-sealed.  We will continue to monitor respiratory status closely. - now on 1 mg Dilaudid q 4 for pain, Gabapentin 200 mg BID, and Advil 400 mg every 6 hours. - breathing comfortably on 2L [place]; continue to titrate as needed to maintain between 92-96% - continue incentive spirometry and out of bed as tolerated  - CT findings most likely related to atelectasis and volume loss with PTX. Given that he is afebrile without leukocytosis, no indication for antibiotic therapy. Will recommend  follow-up CT imaging as outpatient  Dispo: Anticipated discharge in approximately 2-3 days.  Timothy Horn D, DO 06/18/2018, 2:57 PM Pager: [phone]

## 2018-06-19 ENCOUNTER — Inpatient Hospital Stay (HOSPITAL_COMMUNITY): Payer: Self-pay

## 2018-06-19 NOTE — Progress Notes (Signed)
   Subjective: Mr. Escalante was seen and evaluated at bedside. Pain improved today. He was able to get out of bed twice yesterday. Denies any shortness of breath.   Objective:  Vital signs in last 24 hours: Vitals:   06/18/18 0814 06/18/18 1728 06/18/18 2259 06/19/18 0820  BP: 117/75 114/73 122/73 119/71  Pulse: 63 74 67 64  Resp:  16 17   Temp: 97.7 F (36.5 C) 98.4 F (36.9 C) 98.4 F (36.9 C) 97.8 F (36.6 C)  TempSrc:  Oral Oral Oral  SpO2: 100% 100% 98% 97%  Weight:      Height:       General: awake, alert, lying in bed in NAD CV: RRR Pulm: chest tube water-sealed; saturating well on on room air; equal breath sounds bilaterally   Assessment/Plan:  Active Problems:   Spontenous pneumothorax of right lung   Chest tube in place  32 yoM withexercise induced asthmawho presents with 2 weeks of worsening right sided chest pain and cough after weight lifting, found to have acute spontaneous pneumothorax of the right lung that has resolved s/p chest tube placement.  1. Spontaneous right pneumothorax s/p chest tube placement: - appreciate pulmonology assistance with chest tube management  - CXR this morning shows small residual PTX which is improved from yesterday  - Chest tube now water-sealed.  We will continue to monitor respiratory status closely. - pain well controlled with current regimen; should be able to decrease once chest tube is removed; continue 1 mg Dilaudid q 4 for pain, Gabapentin 200 mg BID, and Advil 400 mg every 6 hours. - breathing comfortably on room air - continue incentive spirometry and out of bed as tolerated  - will need follow-up CT imaging as outpatient  Dispo: Anticipated discharge in approximately 2-3 day(s).   Lenward Chancellor D, DO 06/19/2018, 10:35 AM Pager: [phone]

## 2018-06-19 NOTE — Progress Notes (Signed)
NAME:  Timothy ShihJonathan Helm, MRN:  [id number], DOB:  11/20/1985, LOS: 3 ADMISSION DATE:  06/16/2018, CONSULTATION DATE:  1/26 REFERRING MD:  Dr. Petra KubaVogel, CHIEF COMPLAINT:  PTX   Brief History   33 y/o M admitted 1/26 with 2 week hx of progressive right sided chest pain with associated SOB.  CXR on admit confirmed pneumothorax.  Chest tube placed in ER  Past Medical History  Asthma  Anxiety   Significant Hospital Events   1/26  Admit with right pneumothorax  1/27  Air leak with respirations on suction 1/28  Air leak w/ cough, CT to water seal > repeat film w/ slight increase 1/29  Small residual PTX, improved from prior film 1300 1/28  Consults:  1/26 PCCM   Procedures:  Right Chest Tube 1/26 >>   Significant Diagnostic Tests:  CXR 1/26 >> large right pneumothorax without mediastinal shift  HRCT 1/26 >> small right PTX with small bore catheter terminating in the anterior right hemithorax, volume loss & rounded consolidation in the RML, dependent consolidation with surrounding GGO in RLL, no obvious bullae or blebs.    Micro Data:    Antimicrobials:     Interim history/subjective:  Pt reports less chest pain off suction.  Feels better overall.    Objective   Blood pressure 119/71, pulse 64, temperature 97.8 F (36.6 C), temperature source Oral, resp. rate 17, height 5\' 9"  (1.753 m), weight 86.2 kg, SpO2 97 %.        Intake/Output Summary (Last 24 hours) at 06/19/2018 [id number] Last data filed at 06/19/2018 0500 Gross per 24 hour  Intake 480 ml  Output -  Net 480 ml   Filed Weights   06/16/18 1815 06/17/18 0134  Weight: 81.6 kg 86.2 kg    Examination: General: young adult male lying in bed in NAD HEENT: MM pink/moist, good dentition  Neuro: AAOx4, speech clear, MAE   CV: s1s2 rrr, no m/r/g PULM: even/non-labored, lungs bilaterally clear, right chest tube in place with 1/7 air leak JX:BJYNGI:soft, non-tender, bsx4 active  Extremities: warm/dry, no edema  Skin: no rashes or  lesions  Resolved Hospital Problem list      Assessment & Plan:   Large Right Pneumothorax  -spontaneous, s/p chest tube in ER -pt denies smoking, vaping, no recent surgeries.  UDS negative -thinks he felt it happen two weeks prior to admit while working out P: Chest tube to water seal Follow daily CXR while CT in place   RML Rounded / Dependent Consolidation, RLL GGO -suspect atelectasis / volume loss with pneumothorax P: Suspect atelectasis from lung being down for some time, no evidence of infection  OOB to chair, mobilize as able    Best practice:  Diet: regular  Pain/Anxiety/Delirium protocol (if indicated): pain control per primary  VAP protocol (if indicated): n/a DVT prophylaxis: per primary  GI prophylaxis: n/a  Glucose control: n/a Mobility: as tolerated  Code Status: full code  Family Communication: Patient updated on plan of care. No family at bedside on am rounds.  Disposition: per Teaching Service.  PCCM will continue to follow.   Labs   CBC: Recent Labs  Lab 06/16/18 1959 06/17/18 0256  WBC 6.6 10.5  HGB 15.4 15.9  HCT 45.2 45.6  MCV 86.4 86.7  PLT 229 213    Basic Metabolic Panel: Recent Labs  Lab 06/16/18 1959 06/18/18 0230  NA 138 138  K 3.3* 4.0  CL 103 103  CO2 24 25  GLUCOSE 99 89  BUN  13 11  CREATININE 1.23 1.07  CALCIUM 8.8* 8.8*   GFR: Estimated Creatinine Clearance: 106.8 mL/min (by C-G formula based on SCr of 1.07 mg/dL). Recent Labs  Lab 06/16/18 1959 06/17/18 0256  WBC 6.6 10.5    Liver Function Tests: Recent Labs  Lab 06/16/18 1959  AST 23  ALT 23  ALKPHOS 24*  BILITOT 0.9  PROT 7.0  ALBUMIN 4.1   No results for input(s): LIPASE, AMYLASE in the last 168 hours. No results for input(s): AMMONIA in the last 168 hours.  ABG    Component Value Date/Time   TCO2 26 10/04/2015 2310     Coagulation Profile: No results for input(s): INR, PROTIME in the last 168 hours.  Cardiac Enzymes: No results for  input(s): CKTOTAL, CKMB, CKMBINDEX, TROPONINI in the last 168 hours.  HbA1C: No results found for: HGBA1C  CBG: No results for input(s): GLUCAP in the last 168 hours.  Critical care time: n/a    Canary Brim, NP-C [facility] Pgr: [phone] or if no answer [phone] 06/19/2018, 8:38 AM

## 2018-06-20 ENCOUNTER — Inpatient Hospital Stay (HOSPITAL_COMMUNITY): Payer: Self-pay

## 2018-06-20 DIAGNOSIS — J9811 Atelectasis: Secondary | ICD-10-CM

## 2018-06-20 MED ORDER — POLYETHYLENE GLYCOL 3350 17 G PO PACK
17.0000 g | PACK | Freq: Every day | ORAL | Status: DC
  Administered 2018-06-20 – 2018-06-23 (×4): 17 g via ORAL
  Filled 2018-06-20 (×5): qty 1

## 2018-06-20 MED ORDER — SENNA 8.6 MG PO TABS: 2.0000 | ORAL_TABLET | Freq: Every day | ORAL | Status: DC

## 2018-06-20 MED ORDER — SENNA 8.6 MG PO TABS
2.0000 | ORAL_TABLET | Freq: Two times a day (BID) | ORAL | Status: DC
  Administered 2018-06-21 – 2018-06-27 (×10): 17.2 mg via ORAL
  Filled 2018-06-20 (×11): qty 2

## 2018-06-20 NOTE — Progress Notes (Signed)
   Subjective: Mr. Ciolek was seen and evaluated at bedside. No acute events overnight. His pain continues to improve. No shortness of breath. He has not had a BM since he's been here, but is not in any discomfort.   Objective:  Vital signs in last 24 hours: Vitals:   06/18/18 2259 06/19/18 0820 06/19/18 1656 06/20/18 0002  BP: 122/73 119/71 113/83 124/74  Pulse: 67 64 74 68  Resp: 17   18  Temp: 98.4 F (36.9 C) 97.8 F (36.6 C) 98.4 F (36.9 C) 98.1 F (36.7 C)  TempSrc: Oral Oral Oral Oral  SpO2: 98% 97% 98% 99%  Weight:      Height:       General: awake, alert, lying in bed in NAD CV: RRR; no murmurs, rubs or gallops Pulm: chest tube water-sealed; saturating well on room air, good breath sounds bilaterally  Assessment/Plan:  Active Problems:   Spontenous pneumothorax of right lung   Chest tube in place  32 yoM withexercise induced asthmawho presents with 2 weeks of worsening right sided chest pain and cough after weight lifting, found to have acute spontaneous pneumothorax of the right lung that has resolved s/p chest tube placement.  1. Spontaneous right pneumothoraxs/p chest tube placement: - appreciate pulmonologyassistance with chest tube management  - chest tube remains water sealed  - patient breathing comfortably on room air - pain well controlled on current regimen; will schedule bowel regimen  - CXR this morning improved from yesterday with resolution in right apical PTX  - plan is to possibly remove chest tube tomorrow based on imaging tomorrow -continue incentive spirometry and out of bed as tolerated -will need follow-up CT imaging as an outpatient  Dispo: Anticipated discharge pending resolution of pneumothorax.   Lenward Chancellor D, DO 06/20/2018, 8:04 AM Pager: [phone]

## 2018-06-20 NOTE — Progress Notes (Addendum)
NAME:  Timothy ShihJonathan Horn, MRN:  [id number], DOB:  05/03/1986, LOS: 4 ADMISSION DATE:  06/16/2018, CONSULTATION DATE:  1/26 REFERRING MD:  Dr. Petra KubaVogel, CHIEF COMPLAINT:  PTX   Brief History   33 y/o M admitted 1/26 with 2 week hx of progressive right sided chest pain with associated SOB.  CXR on admit confirmed pneumothorax.  Chest tube placed in ER  Past Medical History  Asthma  Anxiety   Significant Hospital Events   1/26  Admit with right pneumothorax  1/27  Air leak with respirations on suction 1/28  Air leak w/ cough, CT to water seal > repeat film w/ slight increase 1/29  Small residual PTX, improved from prior film 1300 1/28 1/30  Air leak improved, CXR improved   Consults:  1/26 PCCM   Procedures:  Right Chest Tube 1/26 >>   Significant Diagnostic Tests:  CXR 1/26 >> large right pneumothorax without mediastinal shift  HRCT 1/26 >> small right PTX with small bore catheter terminating in the anterior right hemithorax, volume loss & rounded consolidation in the RML, dependent consolidation with surrounding GGO in RLL, no obvious bullae or blebs.    Micro Data:    Antimicrobials:     Interim history/subjective:  Pt reports feeling better overall - breathing improved.  Some discomfort at chest tube site.   Objective   Blood pressure 126/79, pulse 69, temperature 98.1 F (36.7 C), temperature source Oral, resp. rate 18, height 5\' 9"  (1.753 m), weight 86.2 kg, SpO2 98 %.        Intake/Output Summary (Last 24 hours) at 06/20/2018 0902 Last data filed at 06/19/2018 2300 Gross per 24 hour  Intake 300 ml  Output 1200 ml  Net -900 ml   Filed Weights   06/16/18 1815 06/17/18 0134  Weight: 81.6 kg 86.2 kg    Examination: General: young adult male lying in bed in NAD   HEENT: MM pink/moist, good dentition, no jvd Neuro: AAOx4, speech clear, MAE   CV: s1s2 rrr, no m/r/g PULM: even/non-labored, lungs bilaterally clear  WU:JWJXGI:soft, non-tender, bsx4 active  Extremities:  warm/dry, no edema  Skin: no rashes or lesions, multiple tattoos   Resolved Hospital Problem list      Assessment & Plan:   Large Right Pneumothorax  -spontaneous, s/p chest tube in ER -pt denies smoking, vaping, no recent surgeries.  UDS negative -thinks he felt it happen two weeks prior to admit while working out P: Continue chest tube to water seal  Repeat CXR in am  May be able to remove CT 1/31 pending CXR review    RML Rounded / Dependent Consolidation, RLL GGO -suspect atelectasis / volume loss with pneumothorax P: Mobilize, OOB Suspect atx from pneumothorax / no evidence of infection    Best practice:  Diet: regular  Pain/Anxiety/Delirium protocol (if indicated): pain control per primary  VAP protocol (if indicated): n/a DVT prophylaxis: per primary  GI prophylaxis: n/a  Glucose control: n/a Mobility: as tolerated  Code Status: full code  Family Communication: Patient & wife updated on plan of care.  Disposition: per Teaching Service.  PCCM will continue to follow.   Labs   CBC: Recent Labs  Lab 06/16/18 1959 06/17/18 0256  WBC 6.6 10.5  HGB 15.4 15.9  HCT 45.2 45.6  MCV 86.4 86.7  PLT 229 213    Basic Metabolic Panel: Recent Labs  Lab 06/16/18 1959 06/18/18 0230  NA 138 138  K 3.3* 4.0  CL 103 103  CO2 24  25  GLUCOSE 99 89  BUN 13 11  CREATININE 1.23 1.07  CALCIUM 8.8* 8.8*   GFR: Estimated Creatinine Clearance: 106.8 mL/min (by C-G formula based on SCr of 1.07 mg/dL). Recent Labs  Lab 06/16/18 1959 06/17/18 0256  WBC 6.6 10.5    Liver Function Tests: Recent Labs  Lab 06/16/18 1959  AST 23  ALT 23  ALKPHOS 24*  BILITOT 0.9  PROT 7.0  ALBUMIN 4.1   No results for input(s): LIPASE, AMYLASE in the last 168 hours. No results for input(s): AMMONIA in the last 168 hours.  ABG    Component Value Date/Time   TCO2 26 10/04/2015 2310     Coagulation Profile: No results for input(s): INR, PROTIME in the last 168  hours.  Cardiac Enzymes: No results for input(s): CKTOTAL, CKMB, CKMBINDEX, TROPONINI in the last 168 hours.  HbA1C: No results found for: HGBA1C  CBG: No results for input(s): GLUCAP in the last 168 hours.  Critical care time: n/a    Canary BrimBrandi Ollis, NP-C [facility] Pgr: [phone] or if no answer [phone] 06/20/2018, 9:02 AM  Attending Note:  33 year old [facility] with spontaneous PT[facility].  C/O some pain in the right side at the site of CT.  I reviewed CXR myself, no PTX noted with CT in place.  Discussed with PCCM-NP.  PTX:  - CT to water seal  - CXR in AM  - If no change in PTX in AM then will consider d/c with F/U CXR in 3 hours, if no change then can likely d/c  Atelectasis  - IS  - Ambulate  GGO:  - F/U CT in 6 wks and f/u as outpatient  PCCM will follow  Patient seen and examined, agree with above note.  I dictated the care and orders written for this patient under my direction.  Alyson ReedyYacoub, Wesam G, MD [phone]

## 2018-06-21 ENCOUNTER — Inpatient Hospital Stay (HOSPITAL_COMMUNITY): Payer: Self-pay

## 2018-06-21 MED ORDER — OXYCODONE-ACETAMINOPHEN 5-325 MG PO TABS: 1.0000 | ORAL_TABLET | ORAL | 0 refills | Status: DC | PRN

## 2018-06-21 MED ORDER — GABAPENTIN 100 MG PO CAPS: 200.0000 mg | ORAL_CAPSULE | Freq: Two times a day (BID) | ORAL | 0 refills | Status: DC

## 2018-06-21 MED FILL — GABAPENTIN 100 MG CAPSULE: 100 | 30 days supply | Qty: 120 | Fill #0

## 2018-06-21 MED FILL — OXYCODONE W/APAP 5/325 TAB: 5-325 | 7 days supply | Qty: 20 | Fill #0

## 2018-06-21 NOTE — Progress Notes (Signed)
Chest tube reviewed with Dr. Molli Knock. Chest tube removed with occlusive dressing applied.  Repeat CXR at 3:30PM.  Reviewed with RN if new symptoms to order stat CXR, call MD.    Canary Brim, NP-C [facility] Pgr: [phone] or if no answer [phone] 06/21/2018, 11:50 AM

## 2018-06-21 NOTE — Progress Notes (Signed)
Patient ID: Timothy Horn, male   DOB: 20-Oct-1985, 33 y.o.   MRN: [id number]  The patient is a 33 year old male who presented with a spontaneous pneumothorax.  He underwent thoracostomy placement and had his chest tube removed today.  The follow-up chest x-ray demonstrates a 10% right pneumothorax.  I have been asked to review the chest x-ray for the purposes of chest tube placement.  The chest x-ray demonstrates a 10% pneumothorax without evidence of mediastinal shift or tension.  The patient is saturating 100% on a nonrebreather mask.  He is nontachypneic and has no complaints.  His blood pressure is 127/74 with a pulse rate of 68.  Recommendations are to perform a chest x-ray in 2 hours.  If the chest x-ray shows significant worsening of the pneumothorax or tension pneumothorax, thoracostomy can be placed.  In addition, if the patient develops hypotension or chest pain, again, thoracostomy can be performed.  This plan was discussed with Dr. Molli Knock and the patient.  We are all in agreement with this plan.

## 2018-06-21 NOTE — Progress Notes (Addendum)
Repeat CXR images reviewed.  Pneumothorax noted.  Patient examined at bedside.  Reports mild chest discomfort which is unchanged from when chest tube was in place.  Overall states he feels better.  Updated on findings of pneumothorax.  Reviewed with Dr. Molli Knock.  Pt placed on 100% NRB in the interim.  IR consulted for CT chest tube.   Timothy Brim, NP-C [facility] Pgr: [phone] or if no answer [phone] 06/21/2018, 4:23 PM

## 2018-06-21 NOTE — Progress Notes (Signed)
Events throughout the day.  CT was removed.  F/U CXR at 3:30 PM showed a small PTX to my eyes when I reviewed it myself.  I went and examined the patient clinically stable, saturating 100% on RA with stable BP and HR.  At that point decision was made to proceed with placing the patient on 100% with repeat CXR in 2 and 6 hours then AM.  Any evidence of worsening then will place a chest tube.  30 minutes later the read was posted and official read is a moderate PTX and evidence of tension.  For that, I was concerned to leave the patient.  I consulted IR STAT to place a pigtail since there are signs of tension.  15 minutes later, I received a call from Dr Bonnielee Haff.  We reviewed the case again.  He disagreed that the PTX is causing tension and estimated size of 10%.  I went and examined the patient again.  Vitals were completely stable.  HR and BP stable, sat 100% on NRB.  I informed him of the situation and the available options at this point.  We offered to place a CT or repeat CXR on 100% and if stable then no CT but if larger then place CT.  He elected to wait.  I also informed him of signs to look for likely SOB, dizziness, inability to catch his breath.  He informed me that he is familiar with the feeling and that these were his presenting symptoms prior.  I spoke with the RN and informed her of signs to look for, asked that she gets vitals q1 hour, if hypotensive, tachycardic or SOB to place a CXR under my name and call PCCM immediately and the pager number was given.  Sign out on the HandOff tool to the night staff also placed.  If signs of deterioration, Dr Bonnielee Haff is aware of patient and will bring team in for the CT.  Weekend team also to see over the weekend.  Spent >60 minutes coordinating above care.  Alyson Reedy, M.D. [facility]/Critical Care Medicine. Pager: [phone]. After hours pager: [phone].

## 2018-06-21 NOTE — Care Management Note (Addendum)
Case Management Note  Patient Details  Name: Timothy Horn MRN: [id number] Date of Birth: 01/31/1986  Subjective/Objective:  Patient for dc home today, NCM assisted with Match Letter, gave to [facility] pharmacy to fill his meds and bring to his room.  MD sending scripts to [facility] pharmacy.  He has a hospital follow up appt scheduled, on AVS.                    Action/Plan: DC home when meds delivered to his room.   Expected Discharge Date:  06/21/18               Expected Discharge Plan:  Home/Self Care  In-House Referral:     Discharge planning Services  CM Consult, MATCH Program, Follow-up appt scheduled  Post Acute Care Choice:    Choice offered to:     DME Arranged:    DME Agency:     HH Arranged:    HH Agency:     Status of Service:  Completed, signed off  If discussed at [employer], dates discussed:    Additional Comments:  Leone Haven, RN 06/21/2018, 2:39 PM

## 2018-06-21 NOTE — Progress Notes (Signed)
    Subjective: Timothy Horn is feeling well, other than continued discomfort at chest tube site and looks forward to having it removed. No shortness of breath. He felt the need to have a BM last night, but was too painful with chest tube in place.   Objective:  Vital signs in last 24 hours: Vitals:   06/20/18 0830 06/20/18 1741 06/20/18 2301 06/21/18 0809  BP: 126/79 116/75 112/72 126/74  Pulse: 69 72 78 72  Resp:  18 17   Temp:  97.8 F (36.6 C) 98 F (36.7 C) 97.7 F (36.5 C)  TempSrc:   Oral Oral  SpO2: 98% 97% 98% 98%  Weight:      Height:       General: awake, alert, lying in bed in NAD CV: RRR; no murmurs, rubs or gallops Pulm: saturating well on room air; good breath sounds bilaterally; chest tube remains water sealed   Assessment/Plan:  Active Problems:   Spontenous pneumothorax of right lung   Chest tube in place   Pneumothorax   Atelectasis  32 yoM withexercise induced asthmawho presents with 2 weeks of worsening right sided chest pain and cough after weight lifting, found to have acute spontaneous pneumothorax of the right lung that has resolved s/p chest tube placement.  1. Spontaneous right pneumothoraxs/p chest tube placement: - appreciate pulmonologyassistancewith chest tube management - patient's respiratory status has remained stable and his pain is well controlled  - CXR this morning without PTX - will have chest tube removed today and repeat CXR in 3 hours; if remains stable he can be discharged later today  -will need pulmonologyfollow-up and CT imaging as an outpatient  Dispo: Anticipated discharge later this afternoon pending repeat CXR remains stable.   Timothy Chancellor D, DO 06/21/2018, 11:51 AM Pager: [phone]

## 2018-06-21 NOTE — Progress Notes (Signed)
eLink Physician-Brief Progress Note Patient Name: Timothy ShihJonathan Glaeser DOB: 08/16/1985 MRN: [id number]   Date of Service  06/21/2018  HPI/Events of Note  Patient complained of mild SOB, not in distress, VS stable.  Repeat CXR shows unchanged pneumothorax  eICU Interventions  Maintain on 100% NRB mask.  Continue to monitor for signs of respiratory distress or hemodynamic instability i.e tachycardia     Intervention Category Intermediate Interventions: Respiratory distress - evaluation and management  Darl Pikesmily T Camyah Pultz 06/21/2018, 9:15 PM

## 2018-06-21 NOTE — Progress Notes (Addendum)
NAME:  Timothy Horn, MRN:  [id number], DOB:  09-Oct-1985, LOS: 5 ADMISSION DATE:  06/16/2018, CONSULTATION DATE:  1/26 REFERRING MD:  Dr. Petra Kuba, CHIEF COMPLAINT:  PTX   Brief History   32 y/o M admitted 1/26 with 2 week hx of progressive right sided chest pain with associated SOB.  CXR on admit confirmed pneumothorax.  Chest tube placed in ER  Past Medical History  Asthma  Anxiety   Significant Hospital Events   1/26  Admit with right pneumothorax  1/27  Air leak with respirations on suction 1/28  Air leak w/ cough, CT to water seal > repeat film w/ slight increase 1/29  Small residual PTX, improved from prior film 1300 1/28 1/30  Air leak improved, CXR improved.    Consults:  1/26 PCCM   Procedures:  Right Chest Tube 1/26 >>   Significant Diagnostic Tests:  CXR 1/26 >> large right pneumothorax without mediastinal shift  HRCT 1/26 >> small right PTX with small bore catheter terminating in the anterior right hemithorax, volume loss & rounded consolidation in the RML, dependent consolidation with surrounding GGO in RLL, no obvious bullae or blebs.    Micro Data:    Antimicrobials:     Interim history/subjective:  Pt reports he "still feels the chest tube".  Some discomfort associated with site.  Feels like he hears air bubbling outside of chest tube "like a straw".    Objective   Blood pressure 126/74, pulse 72, temperature 97.7 F (36.5 C), temperature source Oral, resp. rate 17, height 5\' 9"  (1.753 m), weight 86.2 kg, SpO2 98 %.        Intake/Output Summary (Last 24 hours) at 06/21/2018 0912 Last data filed at 06/21/2018 0500 Gross per 24 hour  Intake 480 ml  Output 650 ml  Net -170 ml   Filed Weights   06/16/18 1815 06/17/18 0134  Weight: 81.6 kg 86.2 kg    Examination: General: young adult male lying in bed in NAD HEENT: MM pink/moist, good dentition Neuro: AAOx4, speech clear, MAE CV: s1s2 rrr, no m/r/g PULM: even/non-labored, lungs bilaterally clear,  right sided chest tube with pleural respiratory variant, cough related air leak 1/7 ZD:GUYQ, non-tender, bsx4 active  Extremities: warm/dry, no edema  Skin: no rashes or lesions.  Multiple tattoos.   Resolved Hospital Problem list      Assessment & Plan:   Large Right Pneumothorax  -spontaneous, s/p chest tube in ER -pt denies smoking, vaping, no recent surgeries.  UDS negative -thinks he felt it happen two weeks prior to admit while working out P: CXR improved, no pneumothorax but residual pneumomediastinum noted Await MD review of chest tube before removal Follow up CXR daily while chest tube in place.  If tube removed, will need f/u CXR 4 hours post removal.    RML Rounded / Dependent Consolidation, RLL GGO -suspect atelectasis / volume loss with pneumothorax P: Mobilize / OOB  Suspect CT findings are from lung being down for some time, no evidence of infection    Best practice:  Diet: regular  Pain/Anxiety/Delirium protocol (if indicated): pain control per primary  VAP protocol (if indicated): n/a DVT prophylaxis: per primary  GI prophylaxis: n/a  Glucose control: n/a Mobility: as tolerated  Code Status: full code  Family Communication: Patient & wife updated on plan of care 1/31  Disposition: per Teaching Service.  PCCM will continue to follow.   Labs   CBC: Recent Labs  Lab 06/16/18 1959 06/17/18 0256  WBC 6.6  10.5  HGB 15.4 15.9  HCT 45.2 45.6  MCV 86.4 86.7  PLT 229 213    Basic Metabolic Panel: Recent Labs  Lab 06/16/18 1959 06/18/18 0230  NA 138 138  K 3.3* 4.0  CL 103 103  CO2 24 25  GLUCOSE 99 89  BUN 13 11  CREATININE 1.23 1.07  CALCIUM 8.8* 8.8*   GFR: Estimated Creatinine Clearance: 106.8 mL/min (by C-G formula based on SCr of 1.07 mg/dL). Recent Labs  Lab 06/16/18 1959 06/17/18 0256  WBC 6.6 10.5    Liver Function Tests: Recent Labs  Lab 06/16/18 1959  AST 23  ALT 23  ALKPHOS 24*  BILITOT 0.9  PROT 7.0  ALBUMIN 4.1    No results for input(s): LIPASE, AMYLASE in the last 168 hours. No results for input(s): AMMONIA in the last 168 hours.  ABG    Component Value Date/Time   TCO2 26 10/04/2015 2310     Coagulation Profile: No results for input(s): INR, PROTIME in the last 168 hours.  Cardiac Enzymes: No results for input(s): CKTOTAL, CKMB, CKMBINDEX, TROPONINI in the last 168 hours.  HbA1C: No results found for: HGBA1C  CBG: No results for input(s): GLUCAP in the last 168 hours.  Critical care time: n/a    Canary Brim, NP-C [facility] Pgr: [phone] or if no answer [phone] 06/21/2018, 9:12 AM  Attending Note:  33 year old male with PMH of asthma presenting with a spontaneous PTX.  CT on waterseal x2 days.  No events overnight, c/o pain at the insertion site.  On exam, chest is clear with air excursion from the sahara when patient coughs but interestingly with a sound within the tube system itself, I do not believe it is from the chest tube itself but from the tubing.  I reviewed CXR myself, no PTX noted.  Discussed with PCCM-NP.  Air excursion:  - As above, from the tube system, I do not believe the patient has an air leak.  PTX: spontaneous in nature, likely related to asthma  - F/U CXR at 3:30 PM  Chest tube:  - Remove  Asthma:  - BD as ordered  - Will need f/u upon discharge with the pulmonary clinic  PCCM will not see over the weekend, please call back with any concerns.  Patient seen and examined, agree with above note.  I dictated the care and orders written for this patient under my direction.  Alyson Reedy, MD [phone]

## 2018-06-21 NOTE — Discharge Summary (Signed)
Name: Claud Ishee MRN: [id number] DOB: 02/14/1986 33 y.o. PCP: Patient, No Pcp Per  Date of Admission: 06/16/2018  6:11 PM Date of Discharge: 07/02/18 Attending Physician: Dr. Cyndie Chime  Discharge Diagnosis: 1. Primary spontaneous right sided pneumothorax  2. Exercise induced asthma   Discharge Medications: Allergies as of 07/02/2018      Reactions   Ativan [lorazepam] Anxiety, Other (See Comments)   Loss of appetite, shaky    Paxil [paroxetine Hcl] Anxiety, Other (See Comments)   Dizziness, insomnia      Medication List    TAKE these medications   albuterol 108 (90 Base) MCG/ACT inhaler Commonly known as:  PROVENTIL HFA;VENTOLIN HFA Inhale 2 puffs into the lungs every 4 (four) hours as needed for wheezing or shortness of breath.   gabapentin 100 MG capsule Commonly known as:  NEURONTIN Take 2 capsules (200 mg total) by mouth 2 (two) times daily for 30 days.   ibuprofen 200 MG tablet Commonly known as:  ADVIL,MOTRIN Take 400 mg by mouth every 6 (six) hours as needed (pain).   loratadine-pseudoephedrine 10-240 MG 24 hr tablet Commonly known as:  CLARITIN-D 24-hour Take 1 tablet by mouth daily as needed (seasonal allergies (springtime)).   multivitamin with minerals Tabs tablet Take 1 tablet by mouth daily.   oxyCODONE-acetaminophen 5-325 MG tablet Commonly known as:  PERCOCET Take 1 tablet by mouth every 4 (four) hours as needed for up to 7 days for severe pain.       Disposition and follow-up:   Mr.Traveion Wattley was discharged from [facility] in Good condition.  At the hospital follow up visit please address:  1.  Spontaneous PTX: Please evaluate pain where chest tube was place and ensure he has not had recurrent shortness of breath. Will need repeat CXR in 4 weeks.   2.  Labs / imaging needed at time of follow-up: CXR  3.  Pending labs/ test needing follow-up: none   Follow-up Appointments: Follow-up Information    [facility] Follow up on 07/18/2018.   Why:  2:30 for hospital follow up Contact information: [place] [address] [phone]       [facility] Follow up.   Why:  you can use the pharmacy for medication discounts Contact information: [address] [place] [address] [phone]          Hospital Course by problem list: 1. Primary spontaneous right pneumothorax: 33 y/o gentleman with history of exercise-induced asthma who presented on 1/26 with 2 weeks of progressive right sided chest pain and shortness of breath that first began after exercising. In the emergency department he was found to have complete right sided pneumothorax which resolved after placement of chest tube. He was admitted for continued monitoring and pain management. Chest x-rays were done daily to monitor for full resolution of PTX. Chest tube was removed on 1/31. Unfortunately, pneumothorax reoccurred after removal of chest tube. He was managed conservatively with 100% O2 but pneumothorax worsened and repeat chest tube was placed on 2/4. The repeat chest tube was removed on 2/10 and f/u CXR on 2/11 showed fully inflated lungs and no evidence of pneumothorax. He was discharged with oxycodone and gabapentin for pain and instructed to f/u for repeat CXR to ensure continued resolution.   Discharge Vitals:   BP 111/72 (BP Location: Right Arm)   Pulse (!) 56   Temp 97.6 F (36.4 C) (Oral)  Resp 17   Ht 5\' 9"  (1.753 m) Comment: pt stated  Wt 86.2 kg   SpO2 98%   BMI 28.06 kg/m   Pertinent Labs, Studies, and Procedures:   CXR on presentation 1/26: FINDINGS: Cardiac shadow is within normal limits. The left lung is clear. Right lung is nearly completely collapsed with only minimal aeration identified consistent with large pneumothorax. A small pleural effusion is noted as well. No significant  mediastinal shift is noted.  IMPRESSION: Large right hydropneumothorax without significant mediastinal shift.  CXR on discharge 2/11: FINDINGS: The heart size and mediastinal contours are within normal limits. Left lung is clear. No pneumothorax is noted. Minimal right pleural effusion is noted. No consolidative process is noted. The visualized skeletal structures are unremarkable.  IMPRESSION: Minimal right pleural effusion.  No pneumothorax is noted.  CT chest high-res: IMPRESSION: 1. Small right pneumothorax with right chest tube in place. 2. Areas of consolidation in all lobes of the right lung, worst in the right lower lobe, most indicative of pneumonia. Given a fair amount of associated volume loss in the right middle lobe and lack of IV contrast, follow-up to clearing is recommended to exclude underlying malignancy. 3. No evidence of interstitial lung disease.  Discharge Instructions: Discharge Instructions    Diet - low sodium heart healthy   Complete by:  As directed    Diet - low sodium heart healthy   Complete by:  As directed    Discharge instructions   Complete by:  As directed    Mr. Waye, it was a pleasure taking care of you. I'm glad you are feeling better!  I am sending you home with a short course of pain medication to use as needed as you recover from the chest tube being placed. You can continue taking the Gabapentin and Ibuprofen as well.  An appointment with a primary care doctor has been made for you to establish care and make sure you have continued to do well since discharge.  Please return if you develop chest pain or shortness of breath.  Take care! Dr. Chesley Mires   Discharge instructions   Complete by:  As directed    Please go to your already scheduled appt for a follow up chest x-ray. Make sure you continue to have regular bowel movements while taking the pain medicine since it can make you constipated. You can buy over the counter  laxatives if needed. If you experience worsening shortness of breath, cough, or chest pain please come back to the hospital.   Increase activity slowly   Complete by:  As directed    Increase activity slowly   Complete by:  As directed       Signed: Ali Lowe, MD 07/02/2018, 1:56 PM   Pager: [phone]

## 2018-06-22 ENCOUNTER — Inpatient Hospital Stay (HOSPITAL_COMMUNITY): Payer: Self-pay

## 2018-06-22 NOTE — Progress Notes (Addendum)
    Subjective: Timothy Horn is feeling well today. His CT was pulled yesterday and Moderate PTX returned. Overnight he had some shortness of Breath, CXR showed stable PTX. He states he has some mild pain at chest tube site. Otherwise feeling well and slept well overnight. No questions this AM.  Objective:  Vital signs in last 24 hours: Vitals:   06/21/18 1650 06/21/18 1653 06/21/18 2030 06/21/18 2300  BP:  127/74 119/82 116/68  Pulse: 75 68 66 66  Resp: 18  16 18   Temp:  98.5 F (36.9 C) 98.2 F (36.8 C) 97.9 F (36.6 C)  TempSrc:   Oral Oral  SpO2: 100% 100% 100% 98%  Weight:      Height:       Physical Exam Constitutional:      General: He is not in acute distress. Cardiovascular:     Rate and Rhythm: Normal rate and regular rhythm.     Heart sounds: Normal heart sounds.  Pulmonary:     Effort: Pulmonary effort is normal. No respiratory distress.     Breath sounds: Normal breath sounds.  Abdominal:     General: Abdomen is flat. Bowel sounds are normal.     Palpations: Abdomen is soft.  Musculoskeletal:        General: No swelling or tenderness.  Skin:    General: Skin is warm and dry.     Comments: Clean, Dry bandage at prior R CT site  Neurological:     Mental Status: He is alert.    Assessment/Plan:  Active Problems:   Spontenous pneumothorax of right lung   Chest tube in place   Pneumothorax   Atelectasis  32 yoM withexercise induced asthmawho presents with 2 weeks of worsening right sided chest pain and cough after weight lifting, found to have acute spontaneous pneumothorax of the right lung that has resolved s/p chest tube placement.  1. Spontaneous right pneumothorax: S/P CT, removed 06/21/18 with recurrence of Mod PTX after this. Monitoring with repeat CXR. Respiratory status remains stable. - Appreciate pulmonology recs - CXR this AM -Pulm follow-up and CT imaging asoutpatient  Dispo: Anticipated discharge 0-1 days pending CXR results and pulm  recs.   Timothy Cord, MD 06/22/2018, 6:55 AM

## 2018-06-22 NOTE — Progress Notes (Addendum)
NAME:  Joseeduardo Winklepleck, MRN:  [id number], DOB:  [birth year], LOS: 6 ADMISSION DATE:  06/16/2018, CONSULTATION DATE:  1/26 REFERRING MD:  Dr. Petra Kuba, CHIEF COMPLAINT:  PTX   Brief History   33 y/o M admitted 1/26 with 2 week hx of progressive right sided chest pain with associated SOB.  CXR on admit confirmed pneumothorax.  Chest tube placed in ER  Past Medical History  Asthma  Anxiety   Significant Hospital Events   1/26  Admit with right pneumothorax  1/27  Air leak with respirations on suction 1/28  Air leak w/ cough, CT to water seal > repeat film w/ slight increase 1/29  Small residual PTX, improved from prior film 1300 1/28 1/30  Air leak improved, CXR improved.    Consults:  1/26 PCCM   Procedures:  Right Chest Tube 1/26 >> 1/31  Significant Diagnostic Tests:  CXR 1/26 >> large right pneumothorax without mediastinal shift  HRCT 1/26 >> small right PTX with small bore catheter terminating in the anterior right hemithorax, volume loss & rounded consolidation in the RML, dependent consolidation with surrounding GGO in RLL, no obvious bullae or blebs.    Micro Data:    Antimicrobials:     Interim history/subjective:  Doing okay , on NRB at 100% . No chest pain . Got winded with walking to BR this am. Feels pulling sensation where chest tube was.   Objective   Blood pressure 115/80, pulse 71, temperature 97.8 F (36.6 C), temperature source Oral, resp. rate 18, height 5\' 9"  (1.753 m), weight 86.2 kg, SpO2 100 %.        Intake/Output Summary (Last 24 hours) at 06/22/2018 1333 Last data filed at 06/21/2018 2300 Gross per 24 hour  Intake -  Output 475 ml  Net -475 ml   Filed Weights   06/16/18 1815 06/17/18 0134  Weight: 81.6 kg 86.2 kg    Examination: General: Adult male in no acute distress HEENT: MM pink and moist, good dentition  Neuro: Alert and oriented, moves all extremities well, follows commands, no focal deficits noted  CV: S1-S2, no MRG  PULM: Clear  to auscultation bilaterally.  Slight diminished on the right base  GI: Soft nontender bowel sounds positive Extremities: Warm and dry no edema Skin: Intact, no rash, multiple tattoos    Resolved Hospital Problem list      Assessment & Plan:   Large Right Pneumothorax  Small residual PTX after chest tube removal . - improved 2/1  -spontaneous, s/p chest tube in ER., removed 1/31 .  -pt denies smoking, vaping, no recent surgeries.  UDS negative -thinks he felt it happen two weeks prior to admit while working out P: CXR improved w/ small residual PTX.  Will need follow up Cxr in am for resolution .  Continue   RML Rounded / Dependent Consolidation, RLL GGO -suspect atelectasis / volume loss with pneumothorax P: Mobilize / OOB  Suspect CT findings are from lung being down for some time, no evidence of infection    Best practice:  Diet: regular  Pain/Anxiety/Delirium protocol (if indicated): pain control per primary  VAP protocol (if indicated): n/a DVT prophylaxis: per primary  GI prophylaxis: n/a  Glucose control: n/a Mobility: as tolerated  Code Status: full code  Family Communication: Patient & wife updated on plan of care 1/31  Disposition: per Teaching Service.  PCCM will continue to follow.   Labs   CBC: Recent Labs  Lab 06/16/18 1959 06/17/18 0256  WBC  6.6 10.5  HGB 15.4 15.9  HCT 45.2 45.6  MCV 86.4 86.7  PLT 229 213    Basic Metabolic Panel: Recent Labs  Lab 06/16/18 1959 06/18/18 0230  NA 138 138  K 3.3* 4.0  CL 103 103  CO2 24 25  GLUCOSE 99 89  BUN 13 11  CREATININE 1.23 1.07  CALCIUM 8.8* 8.8*   GFR: Estimated Creatinine Clearance: 106.8 mL/min (by C-G formula based on SCr of 1.07 mg/dL). Recent Labs  Lab 06/16/18 1959 06/17/18 0256  WBC 6.6 10.5    Liver Function Tests: Recent Labs  Lab 06/16/18 1959  AST 23  ALT 23  ALKPHOS 24*  BILITOT 0.9  PROT 7.0  ALBUMIN 4.1   No results for input(s): LIPASE, AMYLASE in the last 168  hours. No results for input(s): AMMONIA in the last 168 hours.  ABG    Component Value Date/Time   TCO2 26 10/04/2015 2310     Coagulation Profile: No results for input(s): INR, PROTIME in the last 168 hours.  Cardiac Enzymes: No results for input(s): CKTOTAL, CKMB, CKMBINDEX, TROPONINI in the last 168 hours.  HbA1C: No results found for: HGBA1C  CBG: No results for input(s): GLUCAP in the last 168 hours.  Critical care time: n/a        Julio Sicks, NP  [facility]  [phone]  06/22/2018

## 2018-06-23 ENCOUNTER — Inpatient Hospital Stay (HOSPITAL_COMMUNITY): Payer: Self-pay

## 2018-06-23 DIAGNOSIS — R0602 Shortness of breath: Secondary | ICD-10-CM

## 2018-06-23 MED ORDER — NAPHAZOLINE-GLYCERIN 0.012-0.2 % OP SOLN
1.0000 [drp] | Freq: Four times a day (QID) | OPHTHALMIC | Status: DC | PRN
  Filled 2018-06-23 (×2): qty 15

## 2018-06-23 NOTE — Progress Notes (Signed)
Brief note :  Reviewed Chest xray today with Dr. Delton Coombes  , no change in PTX . Will repeat tomorrow if not resolved will need to consider placing chest tube   Timothy Smisek NP-C   Pulmonary and Critical Care  [phone]  06/23/2018

## 2018-06-23 NOTE — Progress Notes (Signed)
   Subjective: No complaints this morning. Endorses some pain where the chest tube used to be, but his pain medicines are working well. Empathized with the slow progress and discussed the plan to follow up critical care recommendations with possibility of placing another chest tube today.   Objective:  Vital signs in last 24 hours: Vitals:   06/22/18 0749 06/22/18 1713 06/22/18 2343 06/23/18 0746  BP: 115/80 124/76 116/71 115/69  Pulse: 71 66 68 65  Resp:   16 14  Temp: 97.8 F (36.6 C) 98 F (36.7 C) (!) 97.5 F (36.4 C)   TempSrc: Oral Oral Axillary   SpO2: 100% 100% 100% 100%  Weight:      Height:       Gen: laying in bed, NAD Pulm: on NRB, CTAB with good air movement throughout Abd: soft, NT, ND  Assessment/Plan:  Active Problems:   Spontenous pneumothorax of right lung   Chest tube in place   Pneumothorax   Atelectasis  [name] with h/o exercise induced asthma who presented with spontaneous pneumothorax likely acquired after working out. S/p chest tube removal with incomplete resolution of pneumothorax.   1. Spontaneous pneumothorax, right lung: Stable. CXR today unchanged from previous, small residual pneumothorax remains. Has been on NRB overnight. Pain is well controlled - f/u PCCM recs  - pain control: gabapentin, dilaudid, ibuprofen   2. Constipation: last BM 7 days ago. Reports it was near impossible to have a BM while the chest tube was in place. Abdomen is NT, ND, and soft. Tolerating po well.  - continue miralax and senna   Dispo: Anticipated discharge in approximately 1-2 day(s) pending PCCM recs and complete resolution of pneumothorax.   Ali Lowe, MD 06/23/2018, 9:20 AM Pager: [phone]

## 2018-06-24 ENCOUNTER — Inpatient Hospital Stay (HOSPITAL_COMMUNITY): Payer: Self-pay

## 2018-06-24 DIAGNOSIS — J9690 Respiratory failure, unspecified, unspecified whether with hypoxia or hypercapnia: Secondary | ICD-10-CM

## 2018-06-24 MED ORDER — POLYETHYLENE GLYCOL 3350 17 G PO PACK
34.0000 g | PACK | Freq: Every day | ORAL | Status: DC
  Administered 2018-06-24 – 2018-06-27 (×4): 34 g via ORAL
  Filled 2018-06-24 (×5): qty 2

## 2018-06-24 NOTE — Progress Notes (Signed)
Medicine attending: I examined this patient today together with resident physician Dr Maryelizabeth Kaufmann and I concur with her evaluation and management plan.  We appreciate ongoing follow-up by pulmonary medicine.  Chest radiograph today is stable.  Persistent residual areas of pleural separation in the right lung now 3 days post chest tube removal.  He is still wearing a rebreathing oxygen mask and saturating 100%.  Residual pneumothorax not appreciated on clinical exam where there is good air movement throughout both lungs and equal resonance to percussion and equally decreased breath sounds at both lung apices. We would feel comfortable on a trial off oxygen, increase activity today, consider discharge later in the day.  No other interventions indicated at this time.

## 2018-06-24 NOTE — Progress Notes (Signed)
   Subjective: No new complaints today. Still hasn't had a BM, will increase bowel regiment today. Discussed CXR results and next steps in management - re-insert chest tube vs. Continue O2 therapy vs. D/c home with outpt f/u CXR. Patient will think about it and discuss with his wife. I also encouraged him to get up and walk the halls to see how his breathing is.   Objective:  Vital signs in last 24 hours: Vitals:   06/23/18 0746 06/23/18 1723 06/23/18 2313 06/24/18 0808  BP: 115/69 117/76 117/86 119/70  Pulse: 65 76 69 62  Resp: 14 16 20 18   Temp:  98.1 F (36.7 C) 98.5 F (36.9 C) 98.2 F (36.8 C)  TempSrc:  Oral  Oral  SpO2: 100% 97% 100% 100%  Weight:      Height:       Gen: laying in bed, NAD Pulm: on NRB, CTAB with good air movement throughout  Assessment/Plan:  Active Problems:   Spontenous pneumothorax of right lung   Chest tube in place   Pneumothorax   Atelectasis  [name] with h/o exercise induced asthma who presented with spontaneous pneumothorax likely acquired after working out. S/p chest tube removal with incomplete resolution of pneumothorax.   1. Spontaneous pneumothorax, right lung: maybe slightly improved this morning. Chest tube has been out for several days without worsening of pneumothorax. Discussed with Dr. Delton Coombes from pulmonology who feels reassured that the air leak has healed. Decision on next step in management is up to the patient. We would feel comfortable sending him home with outpt f/u but if he would prefer to have the pneumothorax completely resolved prior to d/c then we will ask IR to replace the chest tube. Mr. Fetterhoff will discuss with his wife.  - remove NRB and allow ambulation in halls to further evaluate his lung capacity  - f/u with pt regarding his decision  - pain control: gabapentin, dilaudid, ibuprofen   2. Constipation: last BM 8 days ago. Reports it was near impossible to have a BM while the chest tube was in place.Tolerating po well.   - increase miralax and continue senna. He would like to avoid enema/suppository   Dispo: Anticipated discharge in approximately 0-3 days pending patient's decision on repeat chest tube insertion  Ali Lowe, MD 06/24/2018, 9:45 AM Pager: [phone]

## 2018-06-24 NOTE — Progress Notes (Signed)
Called to pt room.  Pt states he feels chest tightness and sob.  States this is different from the way he was earlier.  RLL breath sounds abscent and Right upper very diminished.  No crepitus noted around previous CT site.  O2 sat 98 on 2.5 L [place].  Physican paged.

## 2018-06-24 NOTE — Progress Notes (Signed)
NAME:  Timothy Horn, MRN:  [id number], DOB:  02/01/1986, LOS: 8 ADMISSION DATE:  06/16/2018, CONSULTATION DATE:  1/26 REFERRING MD:  Dr. Petra KubaVogel, CHIEF COMPLAINT:  PTX   Brief History   33 y/o M admitted 1/26 with 2 week hx of progressive right sided chest pain with associated SOB.  CXR on admit confirmed pneumothorax.  Chest tube placed in ER  Past Medical History  Asthma  Anxiety   Significant Hospital Events   1/26  Admit with right pneumothorax  1/27  Air leak with respirations on suction 1/28  Air leak w/ cough, CT to water seal > repeat film w/ slight increase 1/29  Small residual PTX, improved from prior film 1300 1/28 1/30  Air leak improved, CXR improved.    Consults:  1/26 PCCM   Procedures:  Right Chest Tube 1/26 >> 1/31  Significant Diagnostic Tests:  CXR 1/26 >> large right pneumothorax without mediastinal shift  HRCT 1/26 >> small right PTX with small bore catheter terminating in the anterior right hemithorax, volume loss & rounded consolidation in the RML, dependent consolidation with surrounding GGO in RLL, no obvious bullae or blebs.    Micro Data:    Antimicrobials:     Interim history/subjective:  No change in respiratory status  Objective   Blood pressure 119/70, pulse 62, temperature 98.2 F (36.8 C), temperature source Oral, resp. rate 18, height 5\' 9"  (1.753 m), weight 86.2 kg, SpO2 100 %.        Intake/Output Summary (Last 24 hours) at 06/24/2018 1023 Last data filed at 06/24/2018 0900 Gross per 24 hour  Intake 240 ml  Output 400 ml  Net -160 ml   Filed Weights   06/16/18 1815 06/17/18 0134  Weight: 81.6 kg 86.2 kg    Examination: General: Adult male in no acute distress HEENT: MM pink and moist, good dentition  Neuro: Alert and oriented, moves all extremities well, follows commands, no focal deficits noted  CV: S1-S2, no MRG  PULM: Clear to auscultation bilaterally.  Slight diminished on the right base  GI: Soft nontender bowel  sounds positive Extremities: Warm and dry no edema Skin: Intact, no rash, multiple tattoos    Resolved Hospital Problem list      Assessment & Plan:   Spontaneous right pneumothorax.  He had residual moderate size pneumothorax present following chest tube removal on 1/31.  We have now followed it with serial chest x-rays for several days and there has been no increase in size. -s/p chest tube in ER., removed 1/31 .  -pt denies smoking, vaping, no recent surgeries.  UDS negative -thinks he felt it happen two weeks prior to admit while working out P: Chest x-ray reviewed from today 2/3.  Pneumothorax is certainly no bigger, may be slightly smaller with just oxygen therapy At this point is a judgment call as to whether it needs to be drained.  I do not think his air leak persists since the pneumothorax is at least stable by chest x-ray.  Probably low risk for any worsening.  The issue now is does he need tube drainage to facilitate quick resolution or does he want to wait, watch conservatively.  I do not think that a repeat chest tube is absolutely necessary, but it would probably allow him to resolve the pneumothorax more quickly.  If he wants to have it placed then you will need to talk to IR to see if they are willing to do so Continue to follow chest x-ray  He will need outpatient follow-up to ensure that the pneumothorax fully resolves   Labs    Basic Metabolic Panel: Recent Labs  Lab 06/18/18 0230  NA 138  K 4.0  CL 103  CO2 25  GLUCOSE 89  BUN 11  CREATININE 1.07  CALCIUM 8.8*    Please call if we can assist further   Levy Pupaobert Trinetta Alemu, MD, PhD 06/24/2018, 10:29 AM [facility] [phone] or if no answer [phone]

## 2018-06-25 ENCOUNTER — Inpatient Hospital Stay (HOSPITAL_COMMUNITY): Payer: Self-pay

## 2018-06-25 LAB — CBC
HCT: 41.1 % (ref 39.0–52.0)
Hemoglobin: 14.3 g/dL (ref 13.0–17.0)
MCH: 29.8 pg (ref 26.0–34.0)
MCHC: 34.8 g/dL (ref 30.0–36.0)
MCV: 85.6 fL (ref 80.0–100.0)
Platelets: 225 10*3/uL (ref 150–400)
RBC: 4.8 MIL/uL (ref 4.22–5.81)
RDW: 10.8 % — ABNORMAL LOW (ref 11.5–15.5)
WBC: 5.4 10*3/uL (ref 4.0–10.5)
nRBC: 0 % (ref 0.0–0.2)

## 2018-06-25 LAB — PROTIME-INR
INR: 0.97
Prothrombin Time: 12.8 seconds (ref 11.4–15.2)

## 2018-06-25 LAB — APTT: APTT: 34 s (ref 24–36)

## 2018-06-25 MED ORDER — LIDOCAINE-EPINEPHRINE 1 %-1:100000 IJ SOLN
INTRAMUSCULAR | Status: AC
  Filled 2018-06-25: qty 1

## 2018-06-25 MED ORDER — FENTANYL CITRATE (PF) 100 MCG/2ML IJ SOLN
INTRAMUSCULAR | Status: AC | PRN
  Administered 2018-06-25: 50 ug via INTRAVENOUS
  Administered 2018-06-25: 25 ug via INTRAVENOUS
  Administered 2018-06-25: 50 ug via INTRAVENOUS

## 2018-06-25 MED ORDER — FENTANYL CITRATE (PF) 100 MCG/2ML IJ SOLN
INTRAMUSCULAR | Status: AC
  Filled 2018-06-25: qty 4

## 2018-06-25 MED ORDER — HYDROMORPHONE HCL 1 MG/ML IJ SOLN
1.0000 mg | Freq: Once | INTRAMUSCULAR | Status: AC
  Administered 2018-06-25: 1 mg via INTRAVENOUS
  Filled 2018-06-25: qty 1

## 2018-06-25 MED ORDER — MIDAZOLAM HCL 2 MG/2ML IJ SOLN
INTRAMUSCULAR | Status: AC
  Filled 2018-06-25: qty 4

## 2018-06-25 MED ORDER — MIDAZOLAM HCL 2 MG/2ML IJ SOLN
INTRAMUSCULAR | Status: AC | PRN
  Administered 2018-06-25 (×2): 1 mg via INTRAVENOUS

## 2018-06-25 NOTE — Progress Notes (Signed)
Pt placed on cont Pulse ox and NRB.  CXR completed

## 2018-06-25 NOTE — Procedures (Signed)
Interventional Radiology Procedure Note  Procedure: CT guided right chest tube placement.  32F drain for PTX.  Complications: None Recommendations:  - to waterseal/suction, 20cmH20 - Do not submerge - Routine care  - will follow  Signed,  Yvone Neu. Loreta Ave, DO

## 2018-06-25 NOTE — Progress Notes (Signed)
   Subjective: Evaluated overnight for worsening sob. Requested to be transitioned from [place] to NRB. Repeat CXR showed worsening pneumothorax. This morning he is disappointed with his regression. He notes tightness in the right upper lung field and feels that the NRB helps with his sob. Discussed plan to place another chest tube today with IR via CT guidance. Pain has been well controlled and he had a BM yesterday.   Objective:  Vital signs in last 24 hours: Vitals:   06/24/18 1201 06/24/18 1702 06/24/18 2304 06/25/18 0739  BP:  120/79 111/65 113/73  Pulse: 72 68 70 60  Resp: 18 18  12   Temp:  97.8 F (36.6 C) 97.8 F (36.6 C) 97.9 F (36.6 C)  TempSrc:   Oral Oral  SpO2: 97% 99% 99% 100%  Weight:      Height:       Gen: laying in bed, NAD, wife at bedside Pulm: on NRB, absent breath sounds in right upper lung field  Assessment/Plan:  Principal Problem:   Spontenous pneumothorax of right lung Active Problems:   Atelectasis  [name] with h/o exercise induced asthma who presented with spontaneous pneumothorax likely acquired after working out. S/p chest tube removal with incomplete resolution of pneumothorax.   1. Spontaneous pneumothorax, right lung: CXR shows worsening pneumothorax and patient feels more sob.  - place chest tube today via IR with CT guidance  - pain control: gabapentin, dilaudid, ibuprofen   2. Constipation: resolved. BM yesterday. Continue laxatives prn   Dispo: Anticipated discharge in approximately 3 days.  Ali Lowe, MD 06/25/2018, 11:01 AM Pager: [phone]

## 2018-06-25 NOTE — Significant Event (Addendum)
PCCM Event Note  Called to bedside by [facility] to evaluate pt for SOB and worsening pneumothorax on CXR.  Pt sitting up in bed not in any apparent distress  stating that his SOB is not worse in comparison to previous He does state that recently within the last 2 hrs the Resp Therapist switched him from face mask to [place]. He states that there is experiencing some chest pain.   .. Today's Vitals   06/24/18 1702 06/24/18 2006 06/24/18 2304 06/25/18 0200  BP: 120/79  111/65   Pulse: 68  70   Resp: 18     Temp: 97.8 F (36.6 C)  97.8 F (36.6 C)   TempSrc:   Oral   SpO2: 99%  99%   Weight:      Height:      PainSc:  7   7    Body mass index is 28.06 kg/m.    Examination: General: Adult male in no acute distress HEENT: MM pink and moist, good dentition  Neuro: Alert and oriented, moves all extremities well, follows commands, no focal deficits noted  CV: S1-S2, no MRG  PULM: Clear to auscultation bilaterally.  Diminished on the right base  GI: Soft nontender bowel sounds positive Extremities: Warm and dry no edema Skin: Intact, no rash, multiple tattoos    Assessment and Plan: Primary Spontaneous Pneumothorax pt states that he has smoked hookah infrequently and has a h/o marijuana use and vape But last use was >1 yr ago.  In no acute distress at the time of evaluation O2 sats >95% Placed pt on Face mask with supplemental O2 Will repeat CXR 5 AM Had a detailed conversation discussing plan with patient.  If pt has worsening SOB will get a CXR sooner. If repeat CXR in AM shows increased size in pneumothorax pt may benefit from an IR guided CT to expedite healing Defer that decision to PCCM following pt in daytime and primary team.    Signed Dr Newell [facility]

## 2018-06-25 NOTE — Consult Note (Signed)
Chief Complaint: Patient was seen in consultation today for right pigtail chest tube drain placement Chief Complaint  Patient presents with  . Cough  . Shortness of Breath   at the request of Dr CCM  Supervising Physician: Gilmer MorWagner, Jaime  [facility]  History of Present Illness: Timothy Horn is a 33 y.o. male   Large right Spontaneous pneumothorax 1/26 CCM placed chest tube in ED Removed 1/31-- pt doing well  Follow up chest xray revealed +PTX again but asymptomatic Followed pt for 2 days Primary team decided no need for chest tube ; PTX stable - maybe smaller and asymptomatic.  Last night pt had onset right chest pain and SOB CXR:  IMPRESSION: Increased moderate RIGHT pneumothorax.  No mediastinal shift.  Now scheduled for replacement of right pigtail chest tube placement in IR Dr Loreta AveWagner has reviewed imaging and approves procedure   Past Medical History:  Diagnosis Date  . Anxiety   . Asthma     History reviewed. No pertinent surgical history.  Allergies: Ativan [lorazepam] and Paxil [paroxetine hcl]  Medications: Prior to Admission medications   Medication Sig Start Date End Date Taking? Authorizing Provider  albuterol (PROVENTIL HFA;VENTOLIN HFA) 108 (90 Base) MCG/ACT inhaler Inhale 2 puffs into the lungs every 4 (four) hours as needed for wheezing or shortness of breath. 06/17/16  Yes Horton, Mayer Maskerourtney F, MD  ibuprofen (ADVIL,MOTRIN) 200 MG tablet Take 400 mg by mouth every 6 (six) hours as needed (pain).   Yes [provider]  loratadine-pseudoephedrine (CLARITIN-D 24-HOUR) 10-240 MG 24 hr tablet Take 1 tablet by mouth daily as needed (seasonal allergies (springtime)).    Yes [provider]  Multiple Vitamin (MULTIVITAMIN WITH MINERALS) TABS tablet Take 1 tablet by mouth daily.   Yes [provider]  gabapentin (NEURONTIN) 100 MG capsule Take 2 capsules (200 mg total) by mouth 2 (two) times daily for 30 days.  06/21/18 07/21/18  Bloomfield, Karma Ganjaarley D, DO  oxyCODONE-acetaminophen (PERCOCET) 5-325 MG tablet Take 1 tablet by mouth every 4 (four) hours as needed for up to 7 days for severe pain. 06/21/18 06/28/18  Bloomfield, Carley D, DO  PARoxetine (PAXIL) 20 MG tablet Take 1 tablet (20 mg total) by mouth daily. Patient not taking: Reported on 11/30/2014 11/27/14 08/13/15  Carmelina DaneAnderson, Jeffery S, MD  sertraline (ZOLOFT) 50 MG tablet Take 1 tablet (50 mg total) by mouth daily. Patient not taking: Reported on 11/30/2014 11/29/14 08/13/15  Carmelina DaneAnderson, Jeffery S, MD     Family History  Problem Relation Age of Onset  . Mental illness Sister   . Hyperlipidemia Maternal Grandmother     Social History   Socioeconomic History  . Marital status: Married    Spouse name: Programmer, applicationsJewel Verastegui  . Number of children: 1  . Years of education: Not on file  . Highest education level: Not on file  Occupational History  . Not on file  Social Needs  . Financial resource strain: Not very hard  . Food insecurity:    Worry: Never true    Inability: Never true  . Transportation needs:    Medical: No    Non-medical: No  Tobacco Use  . Smoking status: Never Smoker  . Smokeless tobacco: Never Used  Substance and Sexual Activity  . Alcohol use: Yes    Alcohol/week: 2.0 standard drinks    Types: 1 Glasses of wine, 1 Shots of liquor per week    Comment: not even 1 a week  . Drug  use: No  . Sexual activity: Yes    Partners: Female  Lifestyle  . Physical activity:    Days per week: 4 days    Minutes per session: 60 min  . Stress: Not at all  Relationships  . Social connections:    Talks on phone: More than three times a week    Gets together: Once a week    Attends religious service: Never    Active member of club or organization: No    Attends meetings of clubs or organizations: Never    Relationship status: Married  Other Topics Concern  . Not on file  Social History Narrative  . Not on file    Review of Systems: A 12  point ROS discussed and pertinent positives are indicated in the HPI above.  All other systems are negative.  Review of Systems  Constitutional: Positive for activity change. Negative for fatigue and fever.  Respiratory: Positive for chest tightness. Negative for shortness of breath.   Musculoskeletal: Negative for back pain.  Neurological: Negative for weakness.  Psychiatric/Behavioral: Negative for behavioral problems and confusion.    Vital Signs: BP 113/73 (BP Location: Left Arm)   Pulse 60   Temp 97.9 F (36.6 C) (Oral)   Resp 12   Ht 5\' 9"  (1.753 m) Comment: pt stated  Wt 190 lb 0.6 oz (86.2 kg)   SpO2 100%   BMI 28.06 kg/m   Physical Exam Vitals signs reviewed.  Cardiovascular:     Rate and Rhythm: Normal rate and regular rhythm.  Pulmonary:     Effort: Pulmonary effort is normal.  Musculoskeletal: Normal range of motion.  Skin:    General: Skin is warm and dry.  Neurological:     General: No focal deficit present.     Mental Status: He is oriented to person, place, and time.  Psychiatric:        Mood and Affect: Mood normal.        Behavior: Behavior normal.     Imaging: Dg Chest 2 View  Result Date: 06/16/2018 CLINICAL DATA:  Right-sided chest pain and cough for several days EXAM: CHEST - 2 VIEW COMPARISON:  06/17/2016 FINDINGS: Cardiac shadow is within normal limits. The left lung is clear. Right lung is nearly completely collapsed with only minimal aeration identified consistent with large pneumothorax. A small pleural effusion is noted as well. No significant mediastinal shift is noted. IMPRESSION: Large right hydropneumothorax without significant mediastinal shift. Critical Value/emergent results were called by telephone at the time of interpretation on 06/16/2018 at 6:32 pm to Dr. Margarita GrizzleANIELLE RAY , who verbally acknowledged these results. Electronically Signed   By: Alcide CleverMark  Lukens M.D.   On: 06/16/2018 18:34   Ct Chest High Resolution  Result Date:  06/17/2018 CLINICAL DATA:  Pneumothorax.  Asthma. EXAM: CT CHEST WITHOUT CONTRAST TECHNIQUE: Multidetector CT imaging of the chest was performed following the standard protocol without intravenous contrast. High resolution imaging of the lungs, as well as inspiratory and expiratory imaging, was performed. COMPARISON:  None. FINDINGS: Cardiovascular: Vascular structures are unremarkable. Heart size normal. No pericardial effusion. Mediastinum/Nodes: Triangular shaped soft tissue in the prevascular space is indicative of thymus. No pathologically enlarged mediastinal or axillary lymph nodes. Hilar regions are difficult to evaluate without IV contrast. Esophagus is grossly unremarkable. Lungs/Pleura: Small right pneumothorax with small bore pigtail catheter terminating in the anterior right hemithorax. Volume loss and rounded consolidation in the right middle lobe. Dependent consolidation and surrounding ground-glass in the right lower  lobe and minimally within the posterior segment right upper lobe. No evidence of interstitial lung disease. No obvious blebs or bullae. Minimal dependent atelectasis in the left lower lobe. No pleural fluid. Airway is unremarkable. Upper Abdomen: Visualized portions of the liver, gallbladder, adrenal glands, kidneys, spleen, pancreas, stomach and bowel are grossly unremarkable. No upper abdominal adenopathy. Musculoskeletal: Negative. IMPRESSION: 1. Small right pneumothorax with right chest tube in place. 2. Areas of consolidation in all lobes of the right lung, worst in the right lower lobe, most indicative of pneumonia. Given a fair amount of associated volume loss in the right middle lobe and lack of IV contrast, follow-up to clearing is recommended to exclude underlying malignancy. 3. No evidence of interstitial lung disease. Electronically Signed   By: Leanna Battles M.D.   On: 06/17/2018 08:31   Dg Chest Port 1 View  Result Date: 06/25/2018 CLINICAL DATA:  Shortness of breath,  follow-up pneumothorax EXAM: PORTABLE CHEST 1 VIEW COMPARISON:  06/24/2018 FINDINGS: Cardiac shadow is stable. Left lung remains well aerated. Right-sided pneumothorax is again seen stable from the previous day. No new focal abnormality is noted. IMPRESSION: Stable right pneumothorax. Electronically Signed   By: Alcide Clever M.D.   On: 06/25/2018 08:33   Dg Chest Port 1 View  Result Date: 06/24/2018 CLINICAL DATA:  Follow up pneumothorax. EXAM: PORTABLE CHEST 1 VIEW COMPARISON:  Chest radiograph June 24, 2018 at 0544 hours. FINDINGS: Increased RIGHT pneumothorax measuring 18 mm from the chest wall, previously 5 mm. No mediastinal shift. Cardiomediastinal silhouette is normal. RIGHT lung base atelectasis. No pleural effusion or focal consolidation. Soft tissue planes and included osseous structures are unchanged. IMPRESSION: Increased moderate RIGHT pneumothorax.  No mediastinal shift. These results will be called to the ordering clinician or representative by the professional radiologist assistant, and communication documented in [employer] Dashboard. Electronically Signed   By: Awilda Metro M.D.   On: 06/24/2018 23:46   Dg Chest Port 1 View  Result Date: 06/24/2018 CLINICAL DATA:  History of spontaneous pneumothorax initially treated with chest tube placement on 06/16/2018, subsequently removed on 06/21/2018 with recurrent pneumothorax. EXAM: PORTABLE CHEST 1 VIEW COMPARISON:  06/23/2018; 06/22/2018; 06/21/2018; 07/17/2018; 06/16/2018; chest CT-06/16/2018 FINDINGS: Grossly unchanged cardiac silhouette and mediastinal contours. No change to slight reduction in small right-sided pneumothorax. Persistent bibasilar heterogeneous opacities, likely atelectasis. No new focal airspace opacities. No definite pleural effusion. No evidence of edema. No acute osseous abnormalities. IMPRESSION: No change to slight reduction of persistent right-sided pneumothorax following chest tube removal on 06/21/2018.  Electronically Signed   By: Simonne Come M.D.   On: 06/24/2018 08:06   Dg Chest Port 1 View  Result Date: 06/23/2018 CLINICAL DATA:  Right pneumothorax EXAM: PORTABLE CHEST 1 VIEW COMPARISON:  06/22/2018 FINDINGS: Small to moderate right pneumothorax, unchanged. No cardiomediastinal shift. Left lung is clear.  No pleural effusion. The heart is normal in size. IMPRESSION: Small to moderate right pneumothorax, unchanged. Electronically Signed   By: Charline Bills M.D.   On: 06/23/2018 05:56   Dg Chest Port 1 View  Result Date: 06/22/2018 CLINICAL DATA:  Pneumothorax EXAM: PORTABLE CHEST 1 VIEW COMPARISON:  06/21/2018 2032 hours FINDINGS: The right apical pneumothorax has improved and is now between 5 and 10%. There is no evidence of mediastinal shift. Minimal volume loss at the right lung base medially. Left lung is clear. Normal heart size. IMPRESSION: Improved right pneumothorax which is now between 5 and 10% Electronically Signed   By: Dahlia Client.D.  On: 06/22/2018 10:01   Dg Chest Port 1 View  Result Date: 06/21/2018 CLINICAL DATA:  Shortness of breath. Chest tube removed today. EXAM: PORTABLE CHEST 1 VIEW COMPARISON:  06/21/2018 FINDINGS: Persistent right pneumothorax measuring about 1.9 cm depth. Mild atelectasis in the right lung base. Left lung is clear. Heart size and pulmonary vascularity are normal. No significant progression since previous study. IMPRESSION: Persistent right pneumothorax without significant change. Electronically Signed   By: Burman Nieves M.D.   On: 06/21/2018 20:48   Dg Chest Port 1 View  Result Date: 06/21/2018 CLINICAL DATA:  Chest tube removal. EXAM: PORTABLE CHEST 1 VIEW COMPARISON:  06/21/2018. FINDINGS: Interim removal of right chest tube. Moderate right-sided pneumothorax now noted. Mild degree of tension may be present. Atelectatic changes right lung base. No prominent pleural effusion. Heart size normal. Mediastinum hilar structures normal. Heart size  normal. IMPRESSION: Interim removal of right chest tube. Moderate right-sided pneumothorax now noted. A mild degree of tension may be present. Moderate right base atelectasis. Critical Value/emergent results were called by telephone at the time of interpretation on 06/21/2018 at 4:32 pm to nurse Marcelino Duster , who verbally acknowledged these results. Electronically Signed   By: Maisie Fus  Register   On: 06/21/2018 16:33   Dg Chest Port 1 View  Result Date: 06/21/2018 CLINICAL DATA:  Follow-up pneumothorax. EXAM: PORTABLE CHEST 1 VIEW COMPARISON:  Multiple recent chest x-rays and chest CT from 06/16/2018 FINDINGS: The cardiac silhouette, mediastinal and hilar contours are normal in stable. Stable right-sided chest tube. No residual pneumothorax. Persistent right basilar atelectasis. The left lung remains clear. IMPRESSION: Stable right-sided chest tube.  No residual pneumothorax. Persistent right basilar atelectasis. Electronically Signed   By: Rudie Meyer M.D.   On: 06/21/2018 11:06   Dg Chest Port 1 View  Result Date: 06/20/2018 CLINICAL DATA:  Pneumothorax follow-up EXAM: PORTABLE CHEST 1 VIEW COMPARISON:  Yesterday FINDINGS: Unchanged positioning of right-sided pleural catheter. The right pneumothorax is no longer visualized. Improvement in right base atelectasis. Clear left chest. Normal lung volumes. IMPRESSION: 1. Right apical pneumothorax is no longer seen. 2. Mild improvement in right base atelectasis. Electronically Signed   By: Marnee Spring M.D.   On: 06/20/2018 09:23   Dg Chest Port 1 View  Result Date: 06/19/2018 CLINICAL DATA:  Follow-up pneumothorax EXAM: PORTABLE CHEST 1 VIEW COMPARISON:  06/18/2018 FINDINGS: Cardiac shadow is stable. Left lung remains well aerated. Right pneumothorax is again noted but decreased when compared with the prior exam with only minimal apical excursion. Right chest tube remains in place. Right basilar atelectasis is again noted. No bony abnormality is seen.  IMPRESSION: Reduction in right-sided pneumothorax. Persistent right basilar atelectasis is noted. Electronically Signed   By: Alcide Clever M.D.   On: 06/19/2018 07:56   Dg Chest Port 1 View  Result Date: 06/18/2018 CLINICAL DATA:  Pneumothorax. EXAM: PORTABLE CHEST 1 VIEW COMPARISON:  06/18/2018. FINDINGS: Right chest tube in stable position. Small right pneumothorax again noted. Right base atelectasis/infiltrate again noted. No pleural effusion. Heart size normal. No acute bony abnormality IMPRESSION: Right chest tube in stable position. Small right pneumothorax again noted. Right base atelectasis/infiltrate again noted. Electronically Signed   By: Maisie Fus  Register   On: 06/18/2018 13:20   Dg Chest Port 1 View  Result Date: 06/18/2018 CLINICAL DATA:  Follow-up pneumothorax EXAM: PORTABLE CHEST 1 VIEW COMPARISON:  06/17/2018 FINDINGS: Right chest tube has retracted but remains within the thorax. Tiny less than 5% right apical pneumothorax has developed. Consolidation at  the right lung base is stable. Left lung is clear. Normal heart size. IMPRESSION: Less than 5% right apical pneumothorax has developed since the prior study. Otherwise no significant change. Electronically Signed   By: Jolaine Click M.D.   On: 06/18/2018 09:19   Dg Chest Port 1 View  Result Date: 06/17/2018 CLINICAL DATA:  Pneumothorax EXAM: PORTABLE CHEST 1 VIEW COMPARISON:  06/16/2018 FINDINGS: Six right chest tube has migrated towards the right lung base. No pneumothorax. Right lower lobe consolidation is stable. Normal heart size. IMPRESSION: No pneumothorax. Right chest tube remains in place but has migrated towards the right lung base. Stable consolidation in the right lower lobe. Electronically Signed   By: Jolaine Click M.D.   On: 06/17/2018 11:06   Dg Chest Port 1 View  Result Date: 06/16/2018 CLINICAL DATA:  Chest tube adjustment EXAM: PORTABLE CHEST 1 VIEW COMPARISON:  06/16/2018 FINDINGS: Right chest tube pigtail projects  over the mid chest. No appreciable pneumothorax. Right infrahilar atelectasis. Normal heart size. IMPRESSION: Right chest tube pigtail projects over the mid chest. No appreciable pneumothorax. Right infrahilar atelectasis. Electronically Signed   By: Jasmine Pang M.D.   On: 06/16/2018 20:04   Dg Chest Portable 1 View  Result Date: 06/16/2018 CLINICAL DATA:  Pneumothorax, chest tube placement. EXAM: PORTABLE CHEST 1 VIEW COMPARISON:  Radiograph earlier this day 1 hour prior. FINDINGS: Placement of right pigtail catheter on the right with tip centrally in the upper hemithorax. No residual pneumothorax visualized. Right infrahilar atelectasis. Normal heart size and mediastinal contours. Left lung is clear. IMPRESSION: Resolved right pneumothorax after placement of right pigtail catheter. Right infrahilar atelectasis. Electronically Signed   By: Narda Rutherford M.D.   On: 06/16/2018 19:48    Labs:  CBC: Recent Labs    06/16/18 1959 06/17/18 0256 06/25/18 0209  WBC 6.6 10.5 5.4  HGB 15.4 15.9 14.3  HCT 45.2 45.6 41.1  PLT 229 213 225    COAGS: Recent Labs    06/25/18 0209  INR 0.97  APTT 34    BMP: Recent Labs    06/16/18 1959 06/18/18 0230  NA 138 138  K 3.3* 4.0  CL 103 103  CO2 24 25  GLUCOSE 99 89  BUN 13 11  CALCIUM 8.8* 8.8*  CREATININE 1.23 1.07  GFRNONAA >60 >60  GFRAA >60 >60    LIVER FUNCTION TESTS: Recent Labs    06/16/18 1959  BILITOT 0.9  AST 23  ALT 23  ALKPHOS 24*  PROT 7.0  ALBUMIN 4.1    TUMOR MARKERS: No results for input(s): AFPTM, CEA, CA199, CHROMGRNA in the last 8760 hours.  Assessment and Plan:  Right spontaneous PTX Recurrent Chest tube placed 1/26-- removed 1/31 Now enlarging PTX For replacement of chest tube in IR Pt is aware of procedure benefits and risks  Including but not limited to: infection; bl;eeding; damage to surrounding structures Agreeable to proceed Consent signed andin chart  Thank you for this interesting  consult.  I greatly enjoyed meeting Timothy Horn and look forward to participating in their care.  A copy of this report was sent to the requesting provider on this date.  Electronically Signed: Robet Leu, PA-C 06/25/2018, 11:31 AM   I spent a total of 40 Minutes    in face to face in clinical consultation, greater than 50% of which was counseling/coordinating care for right chest tube drain

## 2018-06-25 NOTE — Progress Notes (Signed)
   NAME:  Timothy ShihJonathan Pedregon, MRN:  [id number], DOB:  04/05/1986, LOS: 9 ADMISSION DATE:  06/16/2018, CONSULTATION DATE:  1/26 REFERRING MD:  Dr. Petra KubaVogel, CHIEF COMPLAINT:  PTX   Brief History   33 y/o M admitted 1/26 with 2 week hx of progressive right sided chest pain with associated SOB.  CXR on admit confirmed pneumothorax.  Chest tube placed in ER  Past Medical History  Asthma  Anxiety   Significant Hospital Events   1/26  Admit with right pneumothorax  1/27  Air leak with respirations on suction 1/28  Air leak w/ cough, CT to water seal > repeat film w/ slight increase 1/29  Small residual PTX, improved from prior film 1300 1/28 1/30  Air leak improved, CXR improved.   2/4 repeat chest tube by IR planned  Consults:  1/26 PCCM   Procedures:  Right Chest Tube 1/26 >> 1/31  Significant Diagnostic Tests:  CXR 1/26 >> large right pneumothorax without mediastinal shift  HRCT 1/26 >> small right PTX with small bore catheter terminating in the anterior right hemithorax, volume loss & rounded consolidation in the RML, dependent consolidation with surrounding GGO in RLL, no obvious bullae or blebs.   CXR 2/3 > slightly increased size of PTX  Micro Data:    Antimicrobials:     Interim history/subjective:  Has some dyspnea when off NRB but resolved once back on it. CXR with very slightly enlarged PTX.  IR to see today for pigtail catheter to help expedite resolution of PTX.  Objective   Blood pressure 113/73, pulse 60, temperature 97.9 F (36.6 C), temperature source Oral, resp. rate 12, height 5\' 9"  (1.753 m), weight 86.2 kg, SpO2 100 %.       No intake or output data in the 24 hours ending 06/25/18 1017 Filed Weights   06/16/18 1815 06/17/18 0134  Weight: 81.6 kg 86.2 kg    Examination: General: Young adult male in no acute distress HEENT: MM pink and moist, good dentition  Neuro: Alert and oriented, moves all extremities well, follows commands, no focal deficits noted    CV: RRR, no M/R/G PULM: Clear to auscultation bilaterally.  Slight diminished on the right base  GI: Soft nontender bowel sounds positive Extremities: Warm and dry no edema Skin: Intact, no rash, multiple tattoos     Assessment & Plan:   Spontaneous right pneumothorax.  He had residual moderate size pneumothorax present following chest tube removal on 1/31.  We have now followed it with serial chest x-rays for several days and there has been slight increase overnight 2/3. -s/p chest tube in ER., removed 1/31 .  -pt denies smoking, vaping, no recent surgeries.  UDS negative -thinks he felt it happen two weeks prior to admit while working out P: Repeat pigtail catheter chest tube today by IR. Continue NRB. Continue to follow chest x-ray. He will need outpatient follow-up to ensure that the pneumothorax fully resolves.  Rest per primary team.   Rutherford Guysahul Malachai Schalk, PA - [facility] Pager: [phone].  If no answer, [phone] - [phone] 06/25/2018, 10:24 AM

## 2018-06-26 ENCOUNTER — Inpatient Hospital Stay (HOSPITAL_COMMUNITY): Payer: Self-pay

## 2018-06-26 MED ORDER — ALBUTEROL SULFATE (2.5 MG/3ML) 0.083% IN NEBU: 2.5000 mg | INHALATION_SOLUTION | RESPIRATORY_TRACT | Status: DC | PRN

## 2018-06-26 MED ORDER — HYDROMORPHONE HCL 1 MG/ML IJ SOLN
0.5000 mg | INTRAMUSCULAR | Status: AC | PRN
  Administered 2018-06-26 – 2018-06-27 (×2): 0.5 mg via INTRAVENOUS
  Filled 2018-06-26 (×2): qty 1

## 2018-06-26 MED ORDER — HYDROMORPHONE HCL 1 MG/ML IJ SOLN: 1.0000 mg | INTRAMUSCULAR | Status: DC | PRN

## 2018-06-26 MED ORDER — HYDROCODONE-ACETAMINOPHEN 5-325 MG PO TABS
1.0000 | ORAL_TABLET | ORAL | Status: DC | PRN
  Administered 2018-06-26 – 2018-06-27 (×4): 2 via ORAL
  Filled 2018-06-26 (×5): qty 2

## 2018-06-26 MED ORDER — HYDROCODONE-ACETAMINOPHEN 7.5-325 MG PO TABS: 1.0000 | ORAL_TABLET | ORAL | Status: DC | PRN

## 2018-06-26 MED ORDER — ONDANSETRON 4 MG PO TBDP
4.0000 mg | ORAL_TABLET | Freq: Three times a day (TID) | ORAL | Status: DC | PRN
  Administered 2018-06-26 – 2018-06-27 (×2): 4 mg via ORAL
  Filled 2018-06-26 (×2): qty 1

## 2018-06-26 NOTE — Progress Notes (Addendum)
   NAME:  Timothy Horn, MRN:  [id number], DOB:  1986-03-13, LOS: 10 ADMISSION DATE:  06/16/2018, CONSULTATION DATE:  1/26 REFERRING MD:  Dr. Petra Kuba, CHIEF COMPLAINT:  PTX   Brief History   33 y/o M admitted 1/26 with 2 week hx of progressive right sided chest pain with associated SOB.  CXR on admit confirmed pneumothorax.  Chest tube placed in ER  Past Medical History  Asthma  Anxiety   Significant Hospital Events   1/26  Admit with right pneumothorax  1/27  Air leak with respirations on suction 1/28  Air leak w/ cough, CT to water seal > repeat film w/ slight increase 1/29  Small residual PTX, improved from prior film 1300 1/28 1/30  Air leak improved, CXR improved.   2/4 repeat chest tube by IR planned  Consults:  1/26 PCCM   Procedures:  Right Chest Tube 1/26 >> 1/31  Significant Diagnostic Tests:  CXR 1/26 >> large right pneumothorax without mediastinal shift  HRCT 1/26 >> small right PTX with small bore catheter terminating in the anterior right hemithorax, volume loss & rounded consolidation in the RML, dependent consolidation with surrounding GGO in RLL, no obvious bullae or blebs.   CXR 2/3 > slightly increased size of PTX  Micro Data:    Antimicrobials:     Interim history/subjective:  Has some dyspnea when off NRB but resolved once back on it. CXR with very slightly enlarged PTX.  IR to see today for pigtail catheter to help expedite resolution of PTX.  Objective   Blood pressure 106/60, pulse 68, temperature 97.7 F (36.5 C), temperature source Oral, resp. rate 13, height 5\' 9"  (1.753 m), weight 86.2 kg, SpO2 97 %.        Intake/Output Summary (Last 24 hours) at 06/26/2018 1044 Last data filed at 06/26/2018 0701 Gross per 24 hour  Intake -  Output 600 ml  Net -600 ml   Filed Weights   06/16/18 1815 06/17/18 0134  Weight: 81.6 kg 86.2 kg    Examination: General: Young adult male in no acute distress HEENT: MM pink and moist, good dentition  Neuro:  Alert and oriented, moves all extremities well, follows commands, no focal deficits noted  CV: RRR, no M/R/G PULM: Clear to auscultation bilaterally.  Slight diminished on the right base  GI: Soft nontender bowel sounds positive Extremities: Warm and dry no edema Skin: Intact, no rash, multiple tattoos     Assessment & Plan:   Spontaneous right pneumothorax.  He had residual moderate size pneumothorax present following chest tube removal on 1/31.  We have now followed it with serial chest x-rays for several days and there has been slight increase overnight 2/3. Then CT guided chest tube placed 2/4.  - Maintain chest tube. Will start 20cmH2O suction with goal resolution of PTX.  - Supplemental O2 - Continue to follow chest x-ray. - If does not improve may need to return to suction or ultimately involve thoracic surgery. - Pain control per primary. Having nausea from hydrocodone. Will add zofran PRN.    Asthma: without acute exacerbation - PRN albuterol  Joneen Roach, AGACNP-BC [facility] Pager [phone] or [phone]  06/26/2018 10:48 AM

## 2018-06-26 NOTE — Progress Notes (Signed)
Referring Physician(s): CCM service  Supervising Physician: Jolaine ClickHoss, Arthur  [facility]:  [facility]  Chief Complaint: Follow up right chest tube placed 2/4 by Dr. Loreta AveWagner  Subjective:  Patient reports no change in his breathing, states he feels about the same as prior to chest tube placement except now he has pain at the chest tube insertion site. He denies any other chest pain. Not currently on any supplemental oxygen. He states that he was told he might need surgery if the chest tube doesn't help his lung. He denies any other complaints.   Allergies: Ativan [lorazepam] and Paxil [paroxetine hcl]  Medications: Prior to Admission medications   Medication Sig Start Date End Date Taking? Authorizing Provider  albuterol (PROVENTIL HFA;VENTOLIN HFA) 108 (90 Base) MCG/ACT inhaler Inhale 2 puffs into the lungs every 4 (four) hours as needed for wheezing or shortness of breath. 06/17/16  Yes Horton, Mayer Maskerourtney F, MD  ibuprofen (ADVIL,MOTRIN) 200 MG tablet Take 400 mg by mouth every 6 (six) hours as needed (pain).   Yes [provider]  loratadine-pseudoephedrine (CLARITIN-D 24-HOUR) 10-240 MG 24 hr tablet Take 1 tablet by mouth daily as needed (seasonal allergies (springtime)).    Yes [provider]  Multiple Vitamin (MULTIVITAMIN WITH MINERALS) TABS tablet Take 1 tablet by mouth daily.   Yes [provider]  gabapentin (NEURONTIN) 100 MG capsule Take 2 capsules (200 mg total) by mouth 2 (two) times daily for 30 days. 06/21/18 07/21/18  Bloomfield, Karma Ganjaarley D, DO  oxyCODONE-acetaminophen (PERCOCET) 5-325 MG tablet Take 1 tablet by mouth every 4 (four) hours as needed for up to 7 days for severe pain. 06/21/18 06/28/18  Bloomfield, Carley D, DO  PARoxetine (PAXIL) 20 MG tablet Take 1 tablet (20 mg total) by mouth daily. Patient not taking: Reported on 11/30/2014 11/27/14 08/13/15  Carmelina DaneAnderson, Jeffery S, MD  sertraline (ZOLOFT) 50 MG tablet Take 1 tablet (50 mg total) by mouth  daily. Patient not taking: Reported on 11/30/2014 11/29/14 08/13/15  Carmelina DaneAnderson, Jeffery S, MD     Vital Signs: BP 106/60 (BP Location: Left Arm)   Pulse 68   Temp 97.7 F (36.5 C) (Oral)   Resp 13   Ht 5\' 9"  (1.753 m) Comment: pt stated  Wt 190 lb 0.6 oz (86.2 kg)   SpO2 97%   BMI 28.06 kg/m   Physical Exam Vitals signs and nursing note reviewed.  Constitutional:      General: He is not in acute distress.    Appearance: Normal appearance. He is not ill-appearing.     Comments: Sitting in bed watching TV. Pleasant, not very talkative, answers questions appropriately.   HENT:     Head: Normocephalic.  Cardiovascular:     Rate and Rhythm: Normal rate.  Pulmonary:     Effort: Pulmonary effort is normal.     Comments: (+) right chest tube in place; to waterseal - no OP in pleurvac. Mild pain to palpation. Insertion site clean, dry, dressed appropriately.  Abdominal:     Palpations: Abdomen is soft.  Skin:    General: Skin is warm and dry.  Neurological:     Mental Status: He is alert.     Imaging: Dg Chest Port 1 View  Result Date: 06/26/2018 CLINICAL DATA:  Chest tube follow-up EXAM: PORTABLE CHEST 1 VIEW COMPARISON:  Yesterday FINDINGS: Right-sided chest tube in place. There is a small right apical pneumothorax measuring less than 10%. Some gas and fluid is likely also present at the  peripheral right base. Mild atelectasis at the right base. Normal heart size and mediastinal contours. IMPRESSION: Small right apical and basal pneumothorax. Electronically Signed   By: Marnee Spring M.D.   On: 06/26/2018 09:48   Dg Chest Port 1 View  Result Date: 06/25/2018 CLINICAL DATA:  Shortness of breath, follow-up pneumothorax EXAM: PORTABLE CHEST 1 VIEW COMPARISON:  06/24/2018 FINDINGS: Cardiac shadow is stable. Left lung remains well aerated. Right-sided pneumothorax is again seen stable from the previous day. No new focal abnormality is noted. IMPRESSION: Stable right pneumothorax.  Electronically Signed   By: Alcide Clever M.D.   On: 06/25/2018 08:33   Dg Chest Port 1 View  Result Date: 06/24/2018 CLINICAL DATA:  Follow up pneumothorax. EXAM: PORTABLE CHEST 1 VIEW COMPARISON:  Chest radiograph June 24, 2018 at 0544 hours. FINDINGS: Increased RIGHT pneumothorax measuring 18 mm from the chest wall, previously 5 mm. No mediastinal shift. Cardiomediastinal silhouette is normal. RIGHT lung base atelectasis. No pleural effusion or focal consolidation. Soft tissue planes and included osseous structures are unchanged. IMPRESSION: Increased moderate RIGHT pneumothorax.  No mediastinal shift. These results will be called to the ordering clinician or representative by the professional radiologist assistant, and communication documented in zVision Dashboard. Electronically Signed   By: Awilda Metro M.D.   On: 06/24/2018 23:46   Dg Chest Port 1 View  Result Date: 06/24/2018 CLINICAL DATA:  History of spontaneous pneumothorax initially treated with chest tube placement on 06/16/2018, subsequently removed on 06/21/2018 with recurrent pneumothorax. EXAM: PORTABLE CHEST 1 VIEW COMPARISON:  06/23/2018; 06/22/2018; 06/21/2018; 07/17/2018; 06/16/2018; chest CT-06/16/2018 FINDINGS: Grossly unchanged cardiac silhouette and mediastinal contours. No change to slight reduction in small right-sided pneumothorax. Persistent bibasilar heterogeneous opacities, likely atelectasis. No new focal airspace opacities. No definite pleural effusion. No evidence of edema. No acute osseous abnormalities. IMPRESSION: No change to slight reduction of persistent right-sided pneumothorax following chest tube removal on 06/21/2018. Electronically Signed   By: Simonne Come M.D.   On: 06/24/2018 08:06   Dg Chest Port 1 View  Result Date: 06/23/2018 CLINICAL DATA:  Right pneumothorax EXAM: PORTABLE CHEST 1 VIEW COMPARISON:  06/22/2018 FINDINGS: Small to moderate right pneumothorax, unchanged. No cardiomediastinal shift. Left  lung is clear.  No pleural effusion. The heart is normal in size. IMPRESSION: Small to moderate right pneumothorax, unchanged. Electronically Signed   By: Charline Bills M.D.   On: 06/23/2018 05:56   Ct Perc Pleural Drain W/indwell Cath W/img Guide  Result Date: 06/25/2018 INDICATION: 33 year old male with a history of recurrent right-sided pneumothorax EXAM: CT GUIDED CHEST TUBE PLACEMENT MEDICATIONS: The patient is currently admitted to the hospital and receiving intravenous antibiotics. The antibiotics were administered within an appropriate time frame prior to the initiation of the procedure. ANESTHESIA/SEDATION: Fentanyl 125 mcg IV; Versed 2.0 mg IV Moderate Sedation Time:  17 minutes The patient was continuously monitored during the procedure by the interventional radiology nurse under my direct supervision. COMPLICATIONS: None immediate. PROCEDURE: The procedure, risks, benefits, and alternatives were explained to the patient/patient's family, who provided informed consent on the patient's behalf. Specific risks that were addressed included bleeding, infection, ongoing pneumothorax, need for further procedure/surgery, chance of hemorrhage, hemoptysis, cardiopulmonary collapse, death. Questions regarding the procedure were encouraged and answered. The patient understands and consents to the procedure. Patient was positioned in the supine position on the CT table and scout image of the chest was performed for planning purposes. The right mid axillary line at the level of the nipple was identified, and  prepped and draped in the usual sterile fashion. The skin and subcutaneous tissues were generously infiltrated 1% lidocaine for local anesthesia. A 18 gauge needle was then used to enter the pleural space with aspiration of air. An 035 guidewire was advanced superiorly. Dilation of the skin tract was performed over the wire, and then modified Seldinger technique was used to place a 10 French pigtail catheter  towards the apex. Catheter was attached to water seal chamber and suction was applied confirming a operational chest tube. Final CT was performed. Retention suture was placed.  Sterile dressing was placed. Patient tolerated the procedure well and remained hemodynamically stable throughout. No complications were encountered and no significant blood loss was encounter IMPRESSION: Status post CT-guided placement of right sided chest tube. Signed, Yvone NeuJaime S. Reyne DumasWagner, DO, RPVI [facility] Electronically Signed   By: Gilmer MorJaime  Wagner D.O.   On: 06/25/2018 16:01    Labs:  CBC: Recent Labs    06/16/18 1959 06/17/18 0256 06/25/18 0209  WBC 6.6 10.5 5.4  HGB 15.4 15.9 14.3  HCT 45.2 45.6 41.1  PLT 229 213 225    COAGS: Recent Labs    06/25/18 0209  INR 0.97  APTT 34    BMP: Recent Labs    06/16/18 1959 06/18/18 0230  NA 138 138  K 3.3* 4.0  CL 103 103  CO2 24 25  GLUCOSE 99 89  BUN 13 11  CALCIUM 8.8* 8.8*  CREATININE 1.23 1.07  GFRNONAA >60 >60  GFRAA >60 >60    LIVER FUNCTION TESTS: Recent Labs    06/16/18 1959  BILITOT 0.9  AST 23  ALT 23  ALKPHOS 24*  PROT 7.0  ALBUMIN 4.1    Assessment and Plan:  Patient with spontaneous right pneumothorax s/p original chest tube placement 1/26 in ER which was removed 1/31 by CCM, small residual pneumothorax remained on initial CXR and subsequent CXR showed worsening pneumothorax. IR placed right pigtail chest tube 2/4 - CXR this AM shows <10% pneumothorax.   Moderate pain at chest tube insertion site which is to be expected, insertion site unremarkable on exam, remains to waterseal today. Pain management per primary team. Further chest tube management per CCM. IR will continue to follow along.   Electronically Signed: Villa HerbShannon A Margurete Guaman, PA-C 06/26/2018, 1:22 PM   I spent a total of 15 Minutes at the the patient's bedside AND on the patient's hospital floor or unit,  greater than 50% of which was counseling/coordinating care for right pigtail chest tube follow up.

## 2018-06-26 NOTE — Progress Notes (Signed)
Medicine attending: I examined this patient today and reviewed current imaging together with resident physician Dr Maryelizabeth Kaufmann and I concur with her evaluation and management plan. Right chest tube was replaced yesterday.  Patient is uncomfortable but overall breathing better.  Oxygenating well. Minimal residual pneumothorax less than 10% at the right apex and question small residual at the right base. Chest tube management per pulmonary critical care service.

## 2018-06-26 NOTE — Progress Notes (Signed)
   Subjective: Chest tube reinserted yesterday. Endorsing worsening pain. States that his current pain meds wear off after one hour. Tolerating po. Discussed plan to change pain medications and follow serial CXRs. Encouraged him to let his nurse know if his pain is not being adequately controlled.   Objective:  Vital signs in last 24 hours: Vitals:   06/25/18 1738 06/25/18 1815 06/25/18 2313 06/26/18 0813  BP: 116/66 116/66 121/80 106/60  Pulse: 71  70 68  Resp:   20 13  Temp:   98.6 F (37 C) 97.7 F (36.5 C)  TempSrc:    Oral  SpO2: 100% 100% 100% 97%  Weight:      Height:       Gen: laying in bed, NAD Pulm: comfortable appearing on RA,  right chest tube, bandage is clean and dry, anterior lung sounds are symmetric   Assessment/Plan:  Principal Problem:   Spontenous pneumothorax of right lung Active Problems:   Atelectasis  [name] with h/o exercise induced asthma who presented with spontaneous pneumothorax likely acquired after working out. S/p chest tube removal with incomplete resolution of pneumothorax.   1. Spontaneous pneumothorax, right lung:  - appreciate IR's assistance with chest tube  - Chest tube inserted 2/4, CXR this morning shows improved lung expansion  - worsening pain after chest tube insertion - switch pain regiment from IV to PO - pain control: gabapentin, ibuprofen, Norco 5-325mg  1-2 tablets q4h prn   Dispo: Anticipated discharge in approximately 3 days.  Ali Lowe, MD 06/26/2018, 9:25 AM Pager: [phone]

## 2018-06-27 ENCOUNTER — Inpatient Hospital Stay (HOSPITAL_COMMUNITY): Payer: Self-pay

## 2018-06-27 MED ORDER — HYDROMORPHONE HCL 1 MG/ML IJ SOLN: 1.0000 mg | INTRAMUSCULAR | Status: DC | PRN

## 2018-06-27 MED ORDER — HYDROMORPHONE HCL 1 MG/ML IJ SOLN
1.5000 mg | INTRAMUSCULAR | Status: DC | PRN
  Administered 2018-06-27 – 2018-06-30 (×15): 1.5 mg via INTRAVENOUS
  Filled 2018-06-27 (×15): qty 2

## 2018-06-27 NOTE — Progress Notes (Signed)
   NAME:  Timothy Horn, MRN:  [id number], DOB:  Jun 30, 1985, LOS: 11 ADMISSION DATE:  06/16/2018, CONSULTATION DATE:  1/26 REFERRING MD:  Dr. Petra Kuba, CHIEF COMPLAINT:  PTX   Brief History   33 y/o M admitted 1/26 with 2 week hx of progressive right sided chest pain with associated SOB.  CXR on admit confirmed pneumothorax.  Chest tube placed in ER  Past Medical History  Asthma  Anxiety   Significant Hospital Events   1/26  Admit with right pneumothorax  1/27  Air leak with respirations on suction 1/28  Air leak w/ cough, CT to water seal > repeat film w/ slight increase 1/29  Small residual PTX, improved from prior film 1300 1/28 1/30  Air leak improved, CXR improved.   2/4 repeat chest tube by IR planned  Consults:  1/26 PCCM   Procedures:  Right Chest Tube 1/26 >> 1/31  Significant Diagnostic Tests:  CXR 1/26 >> large right pneumothorax without mediastinal shift  HRCT 1/26 >> small right PTX with small bore catheter terminating in the anterior right hemithorax, volume loss & rounded consolidation in the RML, dependent consolidation with surrounding GGO in RLL, no obvious bullae or blebs.   CXR 2/3 > slightly increased size of PTX  Micro Data:    Antimicrobials:     Interim history/subjective:  Some tube site pain, but better after dilaudid.   Apical PTX remains on CXR  Objective   Blood pressure 120/85, pulse 64, temperature (!) 97.4 F (36.3 C), temperature source Oral, resp. rate 20, height 5\' 9"  (1.753 m), weight 86.2 kg, SpO2 100 %.        Intake/Output Summary (Last 24 hours) at 06/27/2018 1433 Last data filed at 06/27/2018 1235 Gross per 24 hour  Intake -  Output 750 ml  Net -750 ml   Filed Weights   06/16/18 1815 06/17/18 0134  Weight: 81.6 kg 86.2 kg    Examination: General:  Young adult male in NAD Neuro:  Alert, oriented, non-focal HEENT:  [place]/AT, No JVD noted, PERRL Cardiovascular:  RRR, no MRG Lungs:  Clear bilateral breath sounds. No distress.  CT dressing CDI Abdomen:  Soft, non-distended, non-tender Musculoskeletal:  No acute deformity or ROM limitation Skin:  Intact, MMM, multiple tattoos.     Assessment & Plan:   Spontaneous right pneumothorax.  He had residual moderate size pneumothorax present following chest tube removal on 1/31.  We have now followed it with serial chest x-rays for several days and there has been slight increase overnight 2/3. Then CT guided chest tube placed 2/4. 2/6 PTX remains. - Maintain chest tube. Continue 20cmH2O suction.  - Supplemental O2 - Continue to follow chest x-ray. - Pain control per primary. Having nausea from hydrocodone. Will add zofran PRN.  - May need to involve thoarcaic surgery if not resolved by 2/7.   Asthma: without acute exacerbation - PRN albuterol  Joneen Roach, AGACNP-BC [facility] Pulmonary/Critical Care Pager [phone] or [phone]  06/27/2018 2:33 PM

## 2018-06-27 NOTE — Progress Notes (Signed)
Referring Physician(s): Byrum,R  Supervising Physician: Gilmer Mor  Patient Status:  [facility] - In-pt  Chief Complaint:  Right pneumothorax/chest pain  Subjective: Pt feeling about the same; denies worsening chest pain/dyspnea; has some soreness at rt chest tube site   Allergies: Ativan [lorazepam] and Paxil [paroxetine hcl]  Medications: Prior to Admission medications   Medication Sig Start Date End Date Taking? Authorizing Provider  albuterol (PROVENTIL HFA;VENTOLIN HFA) 108 (90 Base) MCG/ACT inhaler Inhale 2 puffs into the lungs every 4 (four) hours as needed for wheezing or shortness of breath. 06/17/16  Yes Horton, Mayer Masker, MD  ibuprofen (ADVIL,MOTRIN) 200 MG tablet Take 400 mg by mouth every 6 (six) hours as needed (pain).   Yes [provider]  loratadine-pseudoephedrine (CLARITIN-D 24-HOUR) 10-240 MG 24 hr tablet Take 1 tablet by mouth daily as needed (seasonal allergies (springtime)).    Yes [provider]  Multiple Vitamin (MULTIVITAMIN WITH MINERALS) TABS tablet Take 1 tablet by mouth daily.   Yes [provider]  gabapentin (NEURONTIN) 100 MG capsule Take 2 capsules (200 mg total) by mouth 2 (two) times daily for 30 days. 06/21/18 07/21/18  Bloomfield, Karma Ganja D, DO  oxyCODONE-acetaminophen (PERCOCET) 5-325 MG tablet Take 1 tablet by mouth every 4 (four) hours as needed for up to 7 days for severe pain. 06/21/18 06/28/18  Bloomfield, Carley D, DO  PARoxetine (PAXIL) 20 MG tablet Take 1 tablet (20 mg total) by mouth daily. Patient not taking: Reported on 11/30/2014 11/27/14 08/13/15  Carmelina Dane, MD  sertraline (ZOLOFT) 50 MG tablet Take 1 tablet (50 mg total) by mouth daily. Patient not taking: Reported on 11/30/2014 11/29/14 08/13/15  Carmelina Dane, MD     Vital Signs: BP 120/85   Pulse 64   Temp (!) 97.4 F (36.3 C) (Oral)   Resp 20   Ht 5\' 9"  (1.753 m) Comment: pt stated  Wt 190 lb 0.6 oz (86.2 kg)   SpO2 100%   BMI 28.06  kg/m   Physical Exam awake/alert; rt chest tube in place and to wall suction; dressing clean and dry;  few bubbles noted in pleuravac  Imaging: Dg Chest Port 1 View  Result Date: 06/27/2018 CLINICAL DATA:  Follow-up chest tube EXAM: PORTABLE CHEST 1 VIEW COMPARISON:  06/26/2018 FINDINGS: Cardiac shadows within normal limits. Right chest tube is again identified and stable. Minimal apical pneumothorax is noted on the right stable from the prior exam. No new focal infiltrate is seen. No bony abnormality is noted. IMPRESSION: Stable right apical pneumothorax.  No new focal abnormality is seen. Electronically Signed   By: Alcide Clever M.D.   On: 06/27/2018 09:09   Dg Chest Port 1 View  Result Date: 06/26/2018 CLINICAL DATA:  Chest tube follow-up EXAM: PORTABLE CHEST 1 VIEW COMPARISON:  Yesterday FINDINGS: Right-sided chest tube in place. There is a small right apical pneumothorax measuring less than 10%. Some gas and fluid is likely also present at the peripheral right base. Mild atelectasis at the right base. Normal heart size and mediastinal contours. IMPRESSION: Small right apical and basal pneumothorax. Electronically Signed   By: Marnee Spring M.D.   On: 06/26/2018 09:48   Dg Chest Port 1 View  Result Date: 06/25/2018 CLINICAL DATA:  Shortness of breath, follow-up pneumothorax EXAM: PORTABLE CHEST 1 VIEW COMPARISON:  06/24/2018 FINDINGS: Cardiac shadow is stable. Left lung remains well aerated. Right-sided pneumothorax is again seen stable from the previous day. No new focal abnormality is noted. IMPRESSION: Stable  right pneumothorax. Electronically Signed   By: Alcide CleverMark  Lukens M.D.   On: 06/25/2018 08:33   Dg Chest Port 1 View  Result Date: 06/24/2018 CLINICAL DATA:  Follow up pneumothorax. EXAM: PORTABLE CHEST 1 VIEW COMPARISON:  Chest radiograph June 24, 2018 at 0544 hours. FINDINGS: Increased RIGHT pneumothorax measuring 18 mm from the chest wall, previously 5 mm. No mediastinal shift.  Cardiomediastinal silhouette is normal. RIGHT lung base atelectasis. No pleural effusion or focal consolidation. Soft tissue planes and included osseous structures are unchanged. IMPRESSION: Increased moderate RIGHT pneumothorax.  No mediastinal shift. These results will be called to the ordering clinician or representative by the professional radiologist assistant, and communication documented in zVision Dashboard. Electronically Signed   By: Awilda Metroourtnay  Bloomer M.D.   On: 06/24/2018 23:46   Dg Chest Port 1 View  Result Date: 06/24/2018 CLINICAL DATA:  History of spontaneous pneumothorax initially treated with chest tube placement on 06/16/2018, subsequently removed on 06/21/2018 with recurrent pneumothorax. EXAM: PORTABLE CHEST 1 VIEW COMPARISON:  06/23/2018; 06/22/2018; 06/21/2018; 07/17/2018; 06/16/2018; chest CT-06/16/2018 FINDINGS: Grossly unchanged cardiac silhouette and mediastinal contours. No change to slight reduction in small right-sided pneumothorax. Persistent bibasilar heterogeneous opacities, likely atelectasis. No new focal airspace opacities. No definite pleural effusion. No evidence of edema. No acute osseous abnormalities. IMPRESSION: No change to slight reduction of persistent right-sided pneumothorax following chest tube removal on 06/21/2018. Electronically Signed   By: Simonne ComeJohn  Watts M.D.   On: 06/24/2018 08:06   Ct Perc Pleural Drain W/indwell Cath W/img Guide  Result Date: 06/25/2018 INDICATION: 33 year old male with a history of recurrent right-sided pneumothorax EXAM: CT GUIDED CHEST TUBE PLACEMENT MEDICATIONS: The patient is currently admitted to the hospital and receiving intravenous antibiotics. The antibiotics were administered within an appropriate time frame prior to the initiation of the procedure. ANESTHESIA/SEDATION: Fentanyl 125 mcg IV; Versed 2.0 mg IV Moderate Sedation Time:  17 minutes The patient was continuously monitored during the procedure by the interventional  radiology nurse under my direct supervision. COMPLICATIONS: None immediate. PROCEDURE: The procedure, risks, benefits, and alternatives were explained to the patient/patient's family, who provided informed consent on the patient's behalf. Specific risks that were addressed included bleeding, infection, ongoing pneumothorax, need for further procedure/surgery, chance of hemorrhage, hemoptysis, cardiopulmonary collapse, death. Questions regarding the procedure were encouraged and answered. The patient understands and consents to the procedure. Patient was positioned in the supine position on the CT table and scout image of the chest was performed for planning purposes. The right mid axillary line at the level of the nipple was identified, and prepped and draped in the usual sterile fashion. The skin and subcutaneous tissues were generously infiltrated 1% lidocaine for local anesthesia. A 18 gauge needle was then used to enter the pleural space with aspiration of air. An 035 guidewire was advanced superiorly. Dilation of the skin tract was performed over the wire, and then modified Seldinger technique was used to place a 10 French pigtail catheter towards the apex. Catheter was attached to water seal chamber and suction was applied confirming a operational chest tube. Final CT was performed. Retention suture was placed.  Sterile dressing was placed. Patient tolerated the procedure well and remained hemodynamically stable throughout. No complications were encountered and no significant blood loss was encounter IMPRESSION: Status post CT-guided placement of right sided chest tube. Signed, Yvone NeuJaime S. Reyne DumasWagner, DO, RPVI [facility] Electronically Signed   By: Gilmer MorJaime  Wagner D.O.   On: 06/25/2018 16:01  Labs:  CBC: Recent Labs    06/16/18 1959 06/17/18 0256 06/25/18 0209  WBC 6.6 10.5 5.4  HGB 15.4 15.9 14.3  HCT 45.2 45.6 41.1  PLT 229 213 225     COAGS: Recent Labs    06/25/18 0209  INR 0.97  APTT 34    BMP: Recent Labs    06/16/18 1959 06/18/18 0230  NA 138 138  K 3.3* 4.0  CL 103 103  CO2 24 25  GLUCOSE 99 89  BUN 13 11  CALCIUM 8.8* 8.8*  CREATININE 1.23 1.07  GFRNONAA >60 >60  GFRAA >60 >60    LIVER FUNCTION TESTS: Recent Labs    06/16/18 1959  BILITOT 0.9  AST 23  ALT 23  ALKPHOS 24*  PROT 7.0  ALBUMIN 4.1    Assessment and Plan: Pt with hx spont rt ptx; s/p rt chest tube by CCM 1/26, removed 1/31 with recurrence of ptx; s/p second chest tube by IR 2/4; afebrile; CXR today on wall suction-stable rt apical ptx; per last CCM note plans are to consider weaning from suction on 2/7; could also consider TCTS input if ptx does not resolve   Electronically Signed: D. Jeananne Rama, PA-C 06/27/2018, 11:29 AM   I spent a total of 15 minutes  at the the patient's bedside AND on the patient's hospital floor or unit, greater than 50% of which was counseling/coordinating care for right chest tube    Patient ID: Timothy Horn, male   DOB: May 18, 1986, 33 y.o.   MRN: [id number]

## 2018-06-27 NOTE — Progress Notes (Signed)
   Subjective: Difficulty with pain control overnight and has not slept because of it. The hydrocodone does not help at all. He is requested to go back to the IV dilaudid since he knows that will help and he is tired of being in pain. He is wearing nasal cannula for comfort. Discussed that the CXR this morning showed small residual pneumothorax at the apex.   Objective:  Vital signs in last 24 hours: Vitals:   06/26/18 0813 06/26/18 1612 06/26/18 2311 06/27/18 0757  BP: 106/60 113/71 119/76 120/85  Pulse: 68 73 65 64  Resp: 13 13 20    Temp: 97.7 F (36.5 C) 98.4 F (36.9 C) 98.5 F (36.9 C) (!) 97.4 F (36.3 C)  TempSrc: Oral Oral  Oral  SpO2: 97% 98% 91% 100%  Weight:      Height:       Gen: laying in bed, NAD Pulm: wearing [place],  right chest tube, bandage is clean and dry, anterior lung sounds are symmetric   Assessment/Plan:  Principal Problem:   Spontenous pneumothorax of right lung Active Problems:   Atelectasis  [name] with h/o exercise induced asthma who presented with spontaneous pneumothorax likely acquired after working out. S/p chest tube removal with incomplete resolution of pneumothorax.   1. Spontaneous pneumothorax, right lung:  - Chest tube inserted 2/4, CXR this morning shows stably improved lung expansion - chest tube management per IR  - pain control: gabapentin, ibuprofen, IV dilaudid    Dispo: Anticipated discharge in approximately 3 days.  Ali Lowe, MD 06/27/2018, 10:26 AM Pager: [phone]

## 2018-06-28 ENCOUNTER — Inpatient Hospital Stay (HOSPITAL_COMMUNITY): Payer: Self-pay

## 2018-06-28 DIAGNOSIS — R197 Diarrhea, unspecified: Secondary | ICD-10-CM

## 2018-06-28 DIAGNOSIS — R110 Nausea: Secondary | ICD-10-CM

## 2018-06-28 MED ORDER — ONDANSETRON 4 MG PO TBDP: 4.0000 mg | ORAL_TABLET | Freq: Three times a day (TID) | ORAL | Status: DC | PRN

## 2018-06-28 MED ORDER — POLYETHYLENE GLYCOL 3350 17 G PO PACK: 17.0000 g | PACK | Freq: Every day | ORAL | Status: DC | PRN

## 2018-06-28 MED ORDER — ONDANSETRON HCL 4 MG/2ML IJ SOLN
4.0000 mg | Freq: Three times a day (TID) | INTRAMUSCULAR | Status: DC | PRN
  Administered 2018-06-29 – 2018-07-02 (×7): 4 mg via INTRAVENOUS
  Filled 2018-06-28 (×7): qty 2

## 2018-06-28 MED ORDER — PROMETHAZINE HCL 25 MG/ML IJ SOLN
12.5000 mg | Freq: Once | INTRAMUSCULAR | Status: AC
  Administered 2018-06-28: 12.5 mg via INTRAVENOUS
  Filled 2018-06-28: qty 1

## 2018-06-28 NOTE — Progress Notes (Signed)
Referring Physician(s):  Dr Richardo Priest PCCM   Supervising Physician: Gilmer Mor  Patient Status:  [facility] - In-pt  Chief Complaint:  Spontaneous R PTX   Subjective:  Right pigtail chest tube placed in IR 2/4 Pt is sleepy this am No new sxs Today CXR:  IMPRESSION: Small lateral basilar pneumothorax on the right. Previously seen apical component has resolved in the interval.   Allergies: Ativan [lorazepam] and Paxil [paroxetine hcl]  Medications: Prior to Admission medications   Medication Sig Start Date End Date Taking? Authorizing Provider  albuterol (PROVENTIL HFA;VENTOLIN HFA) 108 (90 Base) MCG/ACT inhaler Inhale 2 puffs into the lungs every 4 (four) hours as needed for wheezing or shortness of breath. 06/17/16  Yes Horton, Mayer Masker, MD  ibuprofen (ADVIL,MOTRIN) 200 MG tablet Take 400 mg by mouth every 6 (six) hours as needed (pain).   Yes [provider]  loratadine-pseudoephedrine (CLARITIN-D 24-HOUR) 10-240 MG 24 hr tablet Take 1 tablet by mouth daily as needed (seasonal allergies (springtime)).    Yes [provider]  Multiple Vitamin (MULTIVITAMIN WITH MINERALS) TABS tablet Take 1 tablet by mouth daily.   Yes [provider]  gabapentin (NEURONTIN) 100 MG capsule Take 2 capsules (200 mg total) by mouth 2 (two) times daily for 30 days. 06/21/18 07/21/18  [place]om[place], Karma Ganja D, DO  oxyCODONE-acetaminophen (PERCOCET) 5-325 MG tablet Take 1 tablet by mouth every 4 (four) hours as needed for up to 7 days for severe pain. 06/21/18 06/28/18  [place], Carley D, DO  PARoxetine (PAXIL) 20 MG tablet Take 1 tablet (20 mg total) by mouth daily. Patient not taking: Reported on 11/30/2014 11/27/14 08/13/15  Carmelina Dane, MD  sertraline (ZOLOFT) 50 MG tablet Take 1 tablet (50 mg total) by mouth daily. Patient not taking: Reported on 11/30/2014 11/29/14 08/13/15  Carmelina Dane, MD     Vital Signs: BP 115/76   Pulse 67   Temp 97.9 F (36.6 C)  (Oral)   Resp 18   Ht 5\' 9"  (1.753 m) Comment: pt stated  Wt 190 lb 0.6 oz (86.2 kg)   SpO2 100%   BMI 28.06 kg/m   Physical Exam Vitals signs reviewed.  Pulmonary:     Effort: Pulmonary effort is normal.     Breath sounds: Normal breath sounds.  Skin:    General: Skin is warm and dry.     Comments: Site clean and dry No air leak in Pleurvcac  Neurological:     Mental Status: He is alert and oriented to person, place, and time.     Imaging: Dg Chest Port 1 View  Result Date: 06/28/2018 CLINICAL DATA:  Follow-up pneumothorax on the right EXAM: PORTABLE CHEST 1 VIEW COMPARISON:  06/27/2018 FINDINGS: Cardiac shadow is stable. Right chest tube is again seen. The previously noted apical pneumothorax is less well appreciated. A small lateral pneumothorax remains inferiorly. Left lung remains clear. No other focal abnormality is noted. IMPRESSION: Small lateral basilar pneumothorax on the right. Previously seen apical component has resolved in the interval. Electronically Signed   By: Alcide Clever M.D.   On: 06/28/2018 08:17   Dg Chest Port 1 View  Result Date: 06/27/2018 CLINICAL DATA:  Follow-up chest tube EXAM: PORTABLE CHEST 1 VIEW COMPARISON:  06/26/2018 FINDINGS: Cardiac shadows within normal limits. Right chest tube is again identified and stable. Minimal apical pneumothorax is noted on the right stable from the prior exam. No new focal infiltrate is seen. No bony abnormality is noted. IMPRESSION:  Stable right apical pneumothorax.  No new focal abnormality is seen. Electronically Signed   By: Alcide Clever M.D.   On: 06/27/2018 09:09   Dg Chest Port 1 View  Result Date: 06/26/2018 CLINICAL DATA:  Chest tube follow-up EXAM: PORTABLE CHEST 1 VIEW COMPARISON:  Yesterday FINDINGS: Right-sided chest tube in place. There is a small right apical pneumothorax measuring less than 10%. Some gas and fluid is likely also present at the peripheral right base. Mild atelectasis at the right base.  Normal heart size and mediastinal contours. IMPRESSION: Small right apical and basal pneumothorax. Electronically Signed   By: Marnee Spring M.D.   On: 06/26/2018 09:48   Dg Chest Port 1 View  Result Date: 06/25/2018 CLINICAL DATA:  Shortness of breath, follow-up pneumothorax EXAM: PORTABLE CHEST 1 VIEW COMPARISON:  06/24/2018 FINDINGS: Cardiac shadow is stable. Left lung remains well aerated. Right-sided pneumothorax is again seen stable from the previous day. No new focal abnormality is noted. IMPRESSION: Stable right pneumothorax. Electronically Signed   By: Alcide Clever M.D.   On: 06/25/2018 08:33   Dg Chest Port 1 View  Result Date: 06/24/2018 CLINICAL DATA:  Follow up pneumothorax. EXAM: PORTABLE CHEST 1 VIEW COMPARISON:  Chest radiograph June 24, 2018 at 0544 hours. FINDINGS: Increased RIGHT pneumothorax measuring 18 mm from the chest wall, previously 5 mm. No mediastinal shift. Cardiomediastinal silhouette is normal. RIGHT lung base atelectasis. No pleural effusion or focal consolidation. Soft tissue planes and included osseous structures are unchanged. IMPRESSION: Increased moderate RIGHT pneumothorax.  No mediastinal shift. These results will be called to the ordering clinician or representative by the professional radiologist assistant, and communication documented in zVision Dashboard. Electronically Signed   By: Awilda Metro M.D.   On: 06/24/2018 23:46   Ct Perc Pleural Drain W/indwell Cath W/img Guide  Result Date: 06/25/2018 INDICATION: 33 year old male with a history of recurrent right-sided pneumothorax EXAM: CT GUIDED CHEST TUBE PLACEMENT MEDICATIONS: The patient is currently admitted to the hospital and receiving intravenous antibiotics. The antibiotics were administered within an appropriate time frame prior to the initiation of the procedure. ANESTHESIA/SEDATION: Fentanyl 125 mcg IV; Versed 2.0 mg IV Moderate Sedation Time:  17 minutes The patient was continuously monitored  during the procedure by the interventional radiology nurse under my direct supervision. COMPLICATIONS: None immediate. PROCEDURE: The procedure, risks, benefits, and alternatives were explained to the patient/patient's family, who provided informed consent on the patient's behalf. Specific risks that were addressed included bleeding, infection, ongoing pneumothorax, need for further procedure/surgery, chance of hemorrhage, hemoptysis, cardiopulmonary collapse, death. Questions regarding the procedure were encouraged and answered. The patient understands and consents to the procedure. Patient was positioned in the supine position on the CT table and scout image of the chest was performed for planning purposes. The right mid axillary line at the level of the nipple was identified, and prepped and draped in the usual sterile fashion. The skin and subcutaneous tissues were generously infiltrated 1% lidocaine for local anesthesia. A 18 gauge needle was then used to enter the pleural space with aspiration of air. An 035 guidewire was advanced superiorly. Dilation of the skin tract was performed over the wire, and then modified Seldinger technique was used to place a 10 French pigtail catheter towards the apex. Catheter was attached to water seal chamber and suction was applied confirming a operational chest tube. Final CT was performed. Retention suture was placed.  Sterile dressing was placed. Patient tolerated the procedure well and remained hemodynamically stable throughout.  No complications were encountered and no significant blood loss was encounter IMPRESSION: Status post CT-guided placement of right sided chest tube. Signed, Yvone NeuJaime S. Reyne DumasWagner, DO, RPVI [facility] Electronically Signed   By: Gilmer MorJaime  Wagner D.O.   On: 06/25/2018 16:01    Labs:  CBC: Recent Labs    06/16/18 1959 06/17/18 0256 06/25/18 0209  WBC 6.6 10.5 5.4  HGB 15.4 15.9 14.3  HCT  45.2 45.6 41.1  PLT 229 213 225    COAGS: Recent Labs    06/25/18 0209  INR 0.97  APTT 34    BMP: Recent Labs    06/16/18 1959 06/18/18 0230  NA 138 138  K 3.3* 4.0  CL 103 103  CO2 24 25  GLUCOSE 99 89  BUN 13 11  CALCIUM 8.8* 8.8*  CREATININE 1.23 1.07  GFRNONAA >60 >60  GFRAA >60 >60    LIVER FUNCTION TESTS: Recent Labs    06/16/18 1959  BILITOT 0.9  AST 23  ALT 23  ALKPHOS 24*  PROT 7.0  ALBUMIN 4.1    Assessment and Plan:  Right spontaneous PTX Right chest tube intact No air leak noted Plan per PCCM  Electronically Signed: Robet LeuPamela A Cyan Moultrie, PA-C 06/28/2018, 10:17 AM   I spent a total of 15 Minutes at the the patient's bedside AND on the patient's hospital floor or unit, greater than 50% of which was counseling/coordinating care for right chest tube

## 2018-06-28 NOTE — Progress Notes (Addendum)
NAME:  Timothy ShihJonathan Gowell, MRN:  [id number], DOB:  05/13/1986, LOS: 12 ADMISSION DATE:  06/16/2018, CONSULTATION DATE:  1/26 REFERRING MD:  Dr. Petra KubaVogel, CHIEF COMPLAINT:  PTX   Brief History   33 y/o M admitted 1/26 with 2 week hx of progressive right sided chest pain with associated SOB.  CXR on admit confirmed pneumothorax.  Chest tube placed in ER  Past Medical History  Asthma  Anxiety   Significant Hospital Events   1/26  Admit with right pneumothorax  1/27  Air leak with respirations on suction 1/28  Air leak w/ cough, CT to water seal > repeat film w/ slight increase 1/29  Small residual PTX, improved from prior film 1300 1/28 1/30  Air leak improved, CXR improved.    2/4  repeat chest tube by IR planned    Consults:  1/26 PCCM   Procedures:  Right Chest Tube 1/26 >> 1/31 Right CT 2/4>>  Significant Diagnostic Tests:  CXR 1/26 >> large right pneumothorax without mediastinal shift  HRCT 1/26 >> small right PTX with small bore catheter terminating in the anterior right hemithorax, volume loss & rounded consolidation in the RML, dependent consolidation with surrounding GGO in RLL, no obvious bullae or blebs.   CXR 2/3 > slightly increased size of PTX 2/7   Repeat CXR>> Small lateral basilar pneumothorax on the right  Previously seen apical component has resolved in the interval.   Micro Data:    Antimicrobials:     Interim history/subjective:  States his pain is better controlled, but very sleepy. Apical PTX  Has resolved, Small lateral basilar pneumothorax on the right    Objective   Blood pressure 115/76, pulse 67, temperature 97.9 F (36.6 C), temperature source Oral, resp. rate 18, height 5\' 9"  (1.753 m), weight 86.2 kg, SpO2 100 %.        Intake/Output Summary (Last 24 hours) at 06/28/2018 1015 Last data filed at 06/27/2018 1950 Gross per 24 hour  Intake -  Output 750 ml  Net -750 ml   Filed Weights   06/16/18 1815 06/17/18 0134  Weight: 81.6 kg 86.2 kg     Examination: General:  Young adult male in NAD, sleeping in bed, wearing oxygen Neuro:  Alert, oriented, non-focal, sleepy HEENT:  [place]/AT, No JVD noted, PERRL Cardiovascular:  S1. S2, RRR, no MRG Lungs:  Clear bilateral breath sounds. No distress. CT dressing CDI, CT to 20 cm H2O, no leak noted with cough Abdomen:  Soft, non-distended, non-tender, BS + Musculoskeletal:  No acute deformity or ROM limitation Skin:  Intact, MMM, multiple tattoos.     Assessment & Plan:   Spontaneous right pneumothorax.  He had residual moderate size pneumothorax present following chest tube removal on 1/31.  We have now followed it with serial chest x-rays for several days and there has been slight increase overnight 2/3. Then CT guided chest tube placed 2/4. 2/7 small  PTX remains. - Maintain chest tube. Continue 20cm H2O suction.  - Supplemental O2 - Continue to follow chest x-ray. - Aggressive pulmonary toilet - Mobilize and OOB as able - Pain control per primary. - Having nausea from hydrocodone.  zofran added PRN on 2/6.  - May need to involve thoarcaic surgery if small remaining pneumothorax does not clear . Encouraged there has been resolution of apical component   Asthma: without acute exacerbation Not on maintenance therapy at home - PRN albuterol  Bevelyn NgoSarah F. , AGACNP-BC [facility] Pager # [phone] After 4  pm call [phone] 06/28/2018 10:15 AM

## 2018-06-28 NOTE — Progress Notes (Signed)
   Subjective: Diarrhea followed by nausea. Has been taking extra laxatives and says that everything finally caught up with him. He feels much better after having a BM. Pain has also been much better controlled since increasing the IV dilaudid. CXR this morning with residual pneumothorax. Discussed plan to await recommendations from pulmonology   Objective:  Vital signs in last 24 hours: Vitals:   06/26/18 2311 06/27/18 0757 06/27/18 2254 06/28/18 0821  BP: 119/76 120/85 114/76 115/76  Pulse: 65 64 70 67  Resp: 20  18   Temp: 98.5 F (36.9 C) (!) 97.4 F (36.3 C) (!) 97.5 F (36.4 C) 97.9 F (36.6 C)  TempSrc:  Oral Oral Oral  SpO2: 91% 100% 100% 100%  Weight:      Height:       Gen: laying in bed, NAD Pulm: wearing [place],  right chest tube, bandage is clean and dry, anterior lung sounds are symmetric   Assessment/Plan:  Principal Problem:   Spontenous pneumothorax of right lung Active Problems:   Atelectasis  [name] with h/o exercise induced asthma who presented with spontaneous pneumothorax likely acquired after working out. S/p chest tube removal with incomplete resolution of pneumothorax.   1. Spontaneous pneumothorax, right lung:  - Chest tube inserted 2/4, CXR this morning shows resolved apical pneumothorax but now with new pneumothorax at the right base.  - f/u pulmonology recommendations, potential for thoracic surgery involvement today.  - chest tube management per pulmonology, currently on suction.   - pain control: gabapentin, ibuprofen, IV dilaudid  - prn zofran po or IV    Dispo: Anticipated discharge pending resolution of pneumothorax  Timothy Lowe, MD 06/28/2018, 9:59 AM Pager: [phone]

## 2018-06-28 NOTE — Progress Notes (Signed)
Medicine attending: I examined this patient today together with resident physician Dr Maryelizabeth KaufmannMarie Vogel and I concur with her evaluation and management plan. He is breathing comfortably.  Pain better controlled.  Oxygenating 100% on 2 L nasal cannula. Breath sounds can be heard over the upper right lung field. Today's x-ray reviewed.  Near complete expansion of the right lung.  Small lateral pneumothorax remains inferiorly. Continue close daily monitoring.  Cardiovascular surgery intervention if necessary.

## 2018-06-29 ENCOUNTER — Inpatient Hospital Stay (HOSPITAL_COMMUNITY): Payer: Self-pay

## 2018-06-29 MED ORDER — MAGIC MOUTHWASH
2.0000 mL | Freq: Once | ORAL | Status: DC
  Filled 2018-06-29: qty 5

## 2018-06-29 NOTE — Progress Notes (Signed)
NAME:  Timothy Horn, MRN:  [id number], DOB:  Jul 19, 1985, LOS: 13 ADMISSION DATE:  06/16/2018, CONSULTATION DATE:  1/26 REFERRING MD:  Dr. Petra Kuba, CHIEF COMPLAINT:  PTX   Brief History   33 y/o M admitted 1/26 with 2 week hx of progressive right sided chest pain with associated SOB.  CXR on admit confirmed pneumothorax.  Chest tube placed in ER  Past Medical History  Asthma  Anxiety   Significant Hospital Events   1/26  Admit with right pneumothorax  1/27  Air leak with respirations on suction 1/28  Air leak w/ cough, CT to water seal > repeat film w/ slight increase 1/29  Small residual PTX, improved from prior film 1300 1/28 1/30  Air leak improved, CXR improved.   2/4  repeat chest tube by IR planned   Consults:  1/26 PCCM   Procedures:  Right Chest Tube 1/26 >> 1/31 Right CT 2/4>>  Significant Diagnostic Tests:  CXR 1/26 >> large right pneumothorax without mediastinal shift  HRCT 1/26 >> small right PTX with small bore catheter terminating in the anterior right hemithorax, volume loss & rounded consolidation in the RML, dependent consolidation with surrounding GGO in RLL, no obvious bullae or blebs.   CXR 2/3 > slightly increased size of PTX 2/7   Repeat CXR>> Small lateral basilar pneumothorax on the right  Previously seen apical component has resolved in the interval.   Micro Data:    Antimicrobials:     Interim history/subjective:  No acute events.  Denies any dyspnea, chest pain  Objective   Blood pressure 111/67, pulse 63, temperature 97.7 F (36.5 C), temperature source Oral, resp. rate 13, height 5\' 9"  (1.753 m), weight 86.2 kg, SpO2 99 %.        Intake/Output Summary (Last 24 hours) at 06/29/2018 1157 Last data filed at 06/28/2018 2100 Gross per 24 hour  Intake -  Output 0 ml  Net 0 ml   Filed Weights   06/16/18 1815 06/17/18 0134  Weight: 81.6 kg 86.2 kg    Examination: Gen:      No acute distress HEENT:  EOMI, sclera anicteric Neck:     No  masses; no thyromegaly Lungs:    Clear to auscultation bilaterally; normal respiratory effort CV:         Regular rate and rhythm; no murmurs Abd:      + bowel sounds; soft, non-tender; no palpable masses, no distension Ext:    No edema; adequate peripheral perfusion Skin:      Warm and dry; no rash Neuro: alert and oriented x 3 Psych: normal mood and affect   Assessment & Plan:   Spontaneous right pneumothorax.  He had residual moderate size pneumothorax present following chest tube removal on 1/31.  We have now followed it with serial chest x-rays for several days and there has been slight increase overnight 2/3. Then CT guided chest tube placed 2/4. 2/7 small  PTX remains.  Chest tube to waterseal for 24 hours.  Chest x-ray today shows continued improvement in pneumothorax.  There is a very small basilar component remaining As the pneumothorax is improving even on waterseal we will continue current therapy We will avoid suction as it appears to cause significant discomfort to him Repeat chest x-ray tomorrow.  Asthma: without acute exacerbation Not on maintenance therapy at home PRN albuterol  Chilton Greathouse MD [facility] Pager [phone] If no answer call [phone] 06/29/2018, 2:27 PM

## 2018-06-29 NOTE — Progress Notes (Signed)
   Subjective: Patient feeling okay today. His pain is improved today. CXR this morning with residual but improving pneumothorax. Discussed plan to await recommendations from pulmonology   Objective:  Vital signs in last 24 hours: Vitals:   06/27/18 2254 06/28/18 0821 06/28/18 2300 06/29/18 0735  BP: 114/76 115/76 121/68 111/67  Pulse: 70 67 80 63  Resp: 18   13  Temp: (!) 97.5 F (36.4 C) 97.9 F (36.6 C) 98 F (36.7 C) 97.7 F (36.5 C)  TempSrc: Oral Oral Oral Oral  SpO2: 100% 100% 97% 99%  Weight:      Height:       Gen: laying in bed, NAD Pulm: wearing [place], right chest tube, bandage is clean and dry, anterior lung sounds are symmetric   Assessment/Plan:  Principal Problem:   Spontenous pneumothorax of right lung Active Problems:   Atelectasis  [name] with h/o exercise induced asthma who presented with spontaneous pneumothorax likely acquired after working out. S/p chest tube removal with incomplete resolution of pneumothorax.   1. Spontaneous pneumothorax, right lung:  - Chest tube inserted 2/4, CXR this morning shows improving right base PTX.  - f/u pulmonology recommendations - With improving PTX, need for surgical involvement less likely  - chest tube management per pulmonology, currently on H2O seal.   - pain control: gabapentin, ibuprofen, IV dilaudid  - prn zofran po or IV   Dispo: Anticipated discharge pending resolution of pneumothorax  Beola Cord, MD 06/29/2018, 11:54 AM

## 2018-06-30 ENCOUNTER — Inpatient Hospital Stay (HOSPITAL_COMMUNITY): Payer: Self-pay

## 2018-06-30 MED ORDER — HYDROMORPHONE HCL 1 MG/ML IJ SOLN
1.0000 mg | INTRAMUSCULAR | Status: DC | PRN
  Administered 2018-06-30 – 2018-07-01 (×7): 1 mg via INTRAVENOUS
  Filled 2018-06-30 (×7): qty 1

## 2018-06-30 NOTE — Progress Notes (Signed)
   NAME:  Timothy Horn, MRN:  [id number], DOB:  [birth year], LOS: 14 ADMISSION DATE:  06/16/2018, CONSULTATION DATE:  1/26 REFERRING MD:  Dr. Petra Kuba, CHIEF COMPLAINT:  PTX   Brief History   33 y/o M admitted 1/26 with 2 week hx of progressive right sided chest pain with associated SOB.  CXR on admit confirmed pneumothorax.  Chest tube placed in ER  Past Medical History  Asthma  Anxiety   Significant Hospital Events   1/26  Admit with right pneumothorax  1/27  Air leak with respirations on suction 1/28  Air leak w/ cough, CT to water seal > repeat film w/ slight increase 1/29  Small residual PTX, improved from prior film 1300 1/28 1/30  Air leak improved, CXR improved.   2/4  repeat chest tube by IR planned   Consults:  1/26 PCCM   Procedures:  Right Chest Tube 1/26 >> 1/31 Right CT 2/4>>  Significant Diagnostic Tests:  CXR 1/26 >> large right pneumothorax without mediastinal shift  HRCT 1/26 >> small right PTX with small bore catheter terminating in the anterior right hemithorax, volume loss & rounded consolidation in the RML, dependent consolidation with surrounding GGO in RLL, no obvious bullae or blebs.    Micro Data:    Antimicrobials:     Interim history/subjective:  No acute events.  Denies any dyspnea, chest pain.  Objective   Blood pressure 110/72, pulse 65, temperature 98 F (36.7 C), temperature source Oral, resp. rate 16, height 5\' 9"  (1.753 m), weight 86.2 kg, SpO2 100 %.        Intake/Output Summary (Last 24 hours) at 06/30/2018 1446 Last data filed at 06/30/2018 1409 Gross per 24 hour  Intake 360 ml  Output 930 ml  Net -570 ml   Filed Weights   06/16/18 1815 06/17/18 0134  Weight: 81.6 kg 86.2 kg    Examination: Gen:      No acute distress HEENT:  EOMI, sclera anicteric Neck:     No masses; no thyromegaly Lungs:    Clear to auscultation bilaterally; normal respiratory effort CV:         Regular rate and rhythm; no murmurs Abd:      + bowel  sounds; soft, non-tender; no palpable masses, no distension Ext:    No edema; adequate peripheral perfusion Skin:      Warm and dry; no rash Neuro: alert and oriented x 3 Psych: normal mood and affect  Assessment & Plan:   Spontaneous right pneumothorax.  He had residual moderate size pneumothorax present following chest tube removal on 1/31.  We have now followed it with serial chest x-rays for several days and there has been slight increase overnight 2/3. Then CT guided chest tube placed 2/4.   Has remained on waterseal since Friday 2/7.  Chest x-ray shows continued improvement.  No pneumothorax visualized today We will continue to waterseal.  If tomorrow's x-ray looks good then we can consider removing the chest tube  Asthma: without acute exacerbation Not on maintenance therapy at home PRN albuterol  Chilton Greathouse MD [place] Pager [phone] If no answer call [phone] 06/30/2018, 2:46 PM

## 2018-06-30 NOTE — Progress Notes (Signed)
   Subjective: Patient feels well today. Pain remains well controlled. CXR shows resolution of pneumothorax. We discussed expectation for CT removal and monitoring for 24hrs, but will defer to pulmonology.  Objective:  Vital signs in last 24 hours: Vitals:   06/29/18 0735 06/29/18 1539 06/29/18 2324 06/30/18 0806  BP: 111/67 131/72 118/79 110/72  Pulse: 63 79 68 65  Resp: 13 16 16 16   Temp: 97.7 F (36.5 C) 97.7 F (36.5 C) 98.4 F (36.9 C) 98 F (36.7 C)  TempSrc: Oral Oral Oral Oral  SpO2: 99% 100% 98% 100%  Weight:      Height:       Gen: laying in bed, NAD Pulm: Right chest tube, bandage is clean and dry, anterior lung sounds are symmetric but diminished (likely decreased effort 2/2 pain)   Assessment/Plan:  Principal Problem:   Spontenous pneumothorax of right lung Active Problems:   Atelectasis  [name] with h/o exercise induced asthma who presented with spontaneous pneumothorax likely acquired after working out. S/p chest tube removal with incomplete resolution of pneumothorax.   Spontaneous pneumothorax, right lung:  - Chest tube inserted 2/4, CXR this morning shows resolution of PTX.  - f/u pulmonology recommendations - Likely chest tube removal today and monitor over next 24 hours - chest tube management per pulmonology, currently on H2O seal.   - pain control: gabapentin, ibuprofen, IV dilaudid  - Will begin to wean dilaudid (to 1mg  q4h)  - prn zofran po or IV   Dispo: Anticipated discharge pending resolution of pneumothorax  Timothy Cord, MD 06/30/2018, 11:59 AM

## 2018-06-30 NOTE — Progress Notes (Signed)
Medicine attending: I examined this patient today together with resident physician Dr. Beola Cord and I concur with his evaluation and management plan. Today's chest x-ray reviewed.  There is now complete reexpansion of the right lung.  Patient moves air poorly over both lungs.  Pain currently tolerable on current regimen. Hopefully chest tube will be removed today.  Observe over 24 hours for stability before discharge.

## 2018-07-01 ENCOUNTER — Inpatient Hospital Stay (HOSPITAL_COMMUNITY): Payer: Self-pay

## 2018-07-01 ENCOUNTER — Other Ambulatory Visit (HOSPITAL_COMMUNITY): Payer: Medicaid Other

## 2018-07-01 MED ORDER — HYDROMORPHONE HCL 1 MG/ML IJ SOLN
1.0000 mg | Freq: Four times a day (QID) | INTRAMUSCULAR | Status: DC | PRN
  Administered 2018-07-02 (×2): 1 mg via INTRAVENOUS
  Filled 2018-07-01 (×2): qty 1

## 2018-07-01 NOTE — Progress Notes (Signed)
   NAME:  Timothy Horn, MRN:  [id number], DOB:  Aug 01, 1985, LOS: 15 ADMISSION DATE:  06/16/2018, CONSULTATION DATE:  1/26 REFERRING MD:  Dr. Petra Kuba, CHIEF COMPLAINT:  PTX   Brief History   33 y/o M admitted 1/26 with 2 week hx of progressive right sided chest pain with associated SOB.  CXR on admit confirmed pneumothorax.  Chest tube placed in ER  Past Medical History  Asthma  Anxiety   Significant Hospital Events   1/26  Admit with right pneumothorax  1/27  Air leak with respirations on suction 1/28  Air leak w/ cough, CT to water seal > repeat film w/ slight increase 1/29  Small residual PTX, improved from prior film 1300 1/28 1/30  Air leak improved, CXR improved.   2/04  repeat chest tube by IR planned 2/10  On water seal, lung remains up   Consults:  1/26 PCCM   Procedures:  Right Chest Tube 1/26 >> 1/31 Right CT 2/4>>  Significant Diagnostic Tests:  CXR 1/26 >> large right pneumothorax without mediastinal shift  HRCT 1/26 >> small right PTX with small bore catheter terminating in the anterior right hemithorax, volume loss & rounded consolidation in the RML, dependent consolidation with surrounding GGO in RLL, no obvious bullae or blebs.    Micro Data:    Antimicrobials:     Interim history/subjective:  Pt denies complaints.  Is hoping to get pain medication prior to chest tube removal.  No air leak with respiration / coughing.   Objective   Blood pressure 112/71, pulse 61, temperature 98.3 F (36.8 C), temperature source Oral, resp. rate 18, height 5\' 9"  (1.753 m), weight 86.2 kg, SpO2 100 %.        Intake/Output Summary (Last 24 hours) at 07/01/2018 1156 Last data filed at 07/01/2018 0420 Gross per 24 hour  Intake 120 ml  Output 1000 ml  Net -880 ml   Filed Weights   06/16/18 1815 06/17/18 0134  Weight: 81.6 kg 86.2 kg    Exam: General: young adult male lying in bed in NAD HEENT: MM pink/moist Neuro: AAOx4, speech clear, MAE CV: s1s2 rrr, no  m/r/g PULM: even/non-labored, lungs bilaterally clear  MB:BUYZ, non-tender, bsx4 active  Extremities: warm/dry, no edema  Skin: no rashes or lesions   Assessment & Plan:   Spontaneous Right Pneumothorax  -initial chest tube removed 1/31 with re-collapse.  Followed then 2nd chest tube placed per IR on 2/4.   -has been on water seal since 2/7 -? If underlying asthma was contributing factor, worse than intermittent asthma P: IR planning to remove chest tube 2/10  Follow up CXR pm of 2/10 to ensure no recurrence of PTX  Asthma -without acute exacerbation P: Consider outpatient PFT's  PRN albuterol    Timothy Brim, NP-C [facility] Pgr: [phone] or if no answer [phone] 07/01/2018, 11:56 AM

## 2018-07-01 NOTE — Progress Notes (Signed)
   Subjective: No new complaints. Pain is adequately controlled. He is eating breakfast. Discussed results of CXR this morning (no residual pneumothorax) and the high probability for chest tube removal today then monitor overnight.   Objective:  Vital signs in last 24 hours: Vitals:   06/30/18 0806 06/30/18 1620 07/01/18 0035 07/01/18 0815  BP: 110/72 116/77 127/72 112/71  Pulse: 65 67 69 61  Resp: 16 15 14 18   Temp: 98 F (36.7 C) 98.2 F (36.8 C) 98.2 F (36.8 C) 98.3 F (36.8 C)  TempSrc: Oral Oral Oral Oral  SpO2: 100% 100% 99% 100%  Weight:      Height:       Gen: laying in bed, NAD, eating breakfast Pulm: wearing 2L [place],  right chest tube, bandage is clean and dry, anterior lung sounds are symmetric   Assessment/Plan:  Principal Problem:   Spontenous pneumothorax of right lung Active Problems:   Atelectasis  [name] with h/o exercise induced asthma who presented with spontaneous pneumothorax likely acquired after working out. S/p chest tube removal with incomplete resolution of pneumothorax.   1. Spontaneous pneumothorax, right lung: chest tube inserted 2/4 - CXR today shows no evidence of pneumothorax. Chest tube has been on water seal since 2/7. Anticipate chest tube removal today but will defer decision to pulmonology  - pain control: gabapentin, ibuprofen, IV dilaudid  - prn zofran po or IV    Dispo: Anticipated discharge pending resolution of pneumothorax (hopefully in 1-2 days)  Ali Lowe, MD 07/01/2018, 9:20 AM Pager: [phone]

## 2018-07-01 NOTE — Plan of Care (Signed)

## 2018-07-01 NOTE — Care Management Note (Signed)
Case Management Note  Patient Details  Name: Timothy Horn MRN: [id number] Date of Birth: 1986-01-08  Subjective/Objective:   Patient for dc home today, NCM assisted with Match Letter, gave to [facility] pharmacy to fill his meds and bring to his room.  MD sending scripts to [facility] pharmacy.  He has a hospital follow up appt scheduled, on AVS.    2/10 Letha Cape RN, BSN - patient ptx recalpsed and chest tube was reinserted, plan for dc of chest tube today and observe overnight, if cont to stay expanded then will plan for dc tomorrow. Wife has medications that was filled by [facility] pharmacy when he was to dc some days ago.                                   Action/Plan: NCM will follow for transition of care needs.   Expected Discharge Date:  06/21/18               Expected Discharge Plan:  Home/Self Care  In-House Referral:     Discharge planning Services  CM Consult, MATCH Program, Follow-up appt scheduled  Post Acute Care Choice:    Choice offered to:     DME Arranged:    DME Agency:     HH Arranged:    HH Agency:     Status of Service:  Completed, signed off  If discussed at [employer], dates discussed:    Additional Comments:  Leone Haven, RN 07/01/2018, 10:33 AM

## 2018-07-01 NOTE — Progress Notes (Signed)
Referring Physician(s): Dr Caren Griffins PCCM  Supervising Physician: Ruel Favors  Patient Status:  [facility] - In-pt  Chief Complaint:  Right spontaneous pneumothorax  Subjective:  Right chest tube placed in IR 2/4 Has been on water seal since 2/7 Pt denies pain; SOB  CXR this am: IMPRESSION: 1. Well-positioned right-sided chest tube. No evidence of pneumothorax. 2. Stable small right pleural effusion.  Dr Miles Costain approves removal Spoke to Canary Brim NP-- approves removal   Allergies: Ativan [lorazepam] and Paxil [paroxetine hcl]  Medications: Prior to Admission medications   Medication Sig Start Date End Date Taking? Authorizing Provider  albuterol (PROVENTIL HFA;VENTOLIN HFA) 108 (90 Base) MCG/ACT inhaler Inhale 2 puffs into the lungs every 4 (four) hours as needed for wheezing or shortness of breath. 06/17/16  Yes Horton, Mayer Masker, MD  ibuprofen (ADVIL,MOTRIN) 200 MG tablet Take 400 mg by mouth every 6 (six) hours as needed (pain).   Yes [provider]  loratadine-pseudoephedrine (CLARITIN-D 24-HOUR) 10-240 MG 24 hr tablet Take 1 tablet by mouth daily as needed (seasonal allergies (springtime)).    Yes [provider]  Multiple Vitamin (MULTIVITAMIN WITH MINERALS) TABS tablet Take 1 tablet by mouth daily.   Yes [provider]  gabapentin (NEURONTIN) 100 MG capsule Take 2 capsules (200 mg total) by mouth 2 (two) times daily for 30 days. 06/21/18 07/21/18  Bloomfield, Carley D, DO  PARoxetine (PAXIL) 20 MG tablet Take 1 tablet (20 mg total) by mouth daily. Patient not taking: Reported on 11/30/2014 11/27/14 08/13/15  Carmelina Dane, MD  sertraline (ZOLOFT) 50 MG tablet Take 1 tablet (50 mg total) by mouth daily. Patient not taking: Reported on 11/30/2014 11/29/14 08/13/15  Carmelina Dane, MD     Vital Signs: BP 112/71   Pulse 61   Temp 98.3 F (36.8 C) (Oral)   Resp 18   Ht 5\' 9"  (1.753 m) Comment: pt stated  Wt 190 lb 0.6 oz (86.2 kg)    SpO2 100%   BMI 28.06 kg/m   Physical Exam Vitals signs reviewed.  Pulmonary:     Effort: Pulmonary effort is normal.  Skin:    General: Skin is warm and dry.     Comments: Site of chest tube is clean and dry  Chest tube removed and vaseline dressing applied  Pt tolerated well  Neurological:     Mental Status: He is alert.     Imaging: Dg Chest Port 1 View  Result Date: 07/01/2018 CLINICAL DATA:  33 year old male with a right-sided chest tube in place EXAM: PORTABLE CHEST 1 VIEW COMPARISON:  Prior chest x-ray 06/30/2018 FINDINGS: Pigtail thoracostomy tube overlies the right chest. No evidence of residual pneumothorax. Small right-sided pleural effusion. Stable and normal cardiac and mediastinal contours. The left lung is clear. IMPRESSION: 1. Well-positioned right-sided chest tube. No evidence of pneumothorax. 2. Stable small right pleural effusion. Electronically Signed   By: Malachy Moan M.D.   On: 07/01/2018 07:57   Dg Chest Port 1 View  Result Date: 06/30/2018 CLINICAL DATA:  Followup pneumothorax. EXAM: PORTABLE CHEST 1 VIEW COMPARISON:  Multiple recent chest x-rays. The most recent is from yesterday. FINDINGS: The right-sided chest tube is stable. No definite pneumothorax. Small right pleural effusion. Streaky bibasilar atelectasis is noted. IMPRESSION: Stable right-sided chest tube.  No definite pneumothorax. Streaky bibasilar atelectasis. Electronically Signed   By: Rudie Meyer M.D.   On: 06/30/2018 07:21   Dg Chest Port 1 View  Result Date: 06/29/2018 CLINICAL DATA:  Chest tube rt side EXAM: PORTABLE CHEST 1 VIEW COMPARISON:  06/28/2018 FINDINGS: A RIGHT-sided pleural catheter is unchanged in position. Small RIGHT basilar pneumothorax persists and appears slightly smaller. There is less pleural effusion on the RIGHT compared to prior studies. The RIGHT lung is otherwise clear. LEFT lung is clear with only minimal atelectasis in the MEDIAL lung base. Heart size is normal.  IMPRESSION: Smaller RIGHT pneumothorax. Electronically Signed   By: Norva Pavlov M.D.   On: 06/29/2018 08:22   Dg Chest Port 1 View  Result Date: 06/28/2018 CLINICAL DATA:  Follow-up pneumothorax on the right EXAM: PORTABLE CHEST 1 VIEW COMPARISON:  06/27/2018 FINDINGS: Cardiac shadow is stable. Right chest tube is again seen. The previously noted apical pneumothorax is less well appreciated. A small lateral pneumothorax remains inferiorly. Left lung remains clear. No other focal abnormality is noted. IMPRESSION: Small lateral basilar pneumothorax on the right. Previously seen apical component has resolved in the interval. Electronically Signed   By: Alcide Clever M.D.   On: 06/28/2018 08:17    Labs:  CBC: Recent Labs    06/16/18 1959 06/17/18 0256 06/25/18 0209  WBC 6.6 10.5 5.4  HGB 15.4 15.9 14.3  HCT 45.2 45.6 41.1  PLT 229 213 225    COAGS: Recent Labs    06/25/18 0209  INR 0.97  APTT 34    BMP: Recent Labs    06/16/18 1959 06/18/18 0230  NA 138 138  K 3.3* 4.0  CL 103 103  CO2 24 25  GLUCOSE 99 89  BUN 13 11  CALCIUM 8.8* 8.8*  CREATININE 1.23 1.07  GFRNONAA >60 >60  GFRAA >60 >60    LIVER FUNCTION TESTS: Recent Labs    06/16/18 1959  BILITOT 0.9  AST 23  ALT 23  ALKPHOS 24*  PROT 7.0  ALBUMIN 4.1    Assessment and Plan:  Right chest tube removal at bedside vaseline dressing applied CXR pending  Electronically Signed: Robet Leu, PA-C 07/01/2018, 1:03 PM   I spent a total of 25 Minutes at the the patient's bedside AND on the patient's hospital floor or unit, greater than 50% of which was counseling/coordinating care for right chest tube removal

## 2018-07-02 ENCOUNTER — Inpatient Hospital Stay (HOSPITAL_COMMUNITY): Payer: Self-pay

## 2018-07-02 DIAGNOSIS — J9383 Other pneumothorax: Principal | ICD-10-CM

## 2018-07-02 DIAGNOSIS — J9311 Primary spontaneous pneumothorax: Secondary | ICD-10-CM

## 2018-07-02 MED ORDER — ALBUTEROL SULFATE HFA 108 (90 BASE) MCG/ACT IN AERS: 2.0000 | INHALATION_SPRAY | RESPIRATORY_TRACT | 0 refills | Status: DC | PRN

## 2018-07-02 MED ORDER — OXYCODONE-ACETAMINOPHEN 5-325 MG PO TABS: 1.0000 | ORAL_TABLET | ORAL | 0 refills | Status: AC | PRN

## 2018-07-02 MED FILL — PROVENTIL HFA 108 (90 BASE): 108 (90 BAS | 18 days supply | Qty: 7 | Fill #0

## 2018-07-02 NOTE — Progress Notes (Signed)
   Subjective: Sleepy this morning but doing well. Denies sob. Pain is tolerable. Discussed CXR results. Planning for DC today   Objective:  Vital signs in last 24 hours: Vitals:   07/01/18 1629 07/01/18 2305 07/02/18 0421 07/02/18 0755  BP: 111/70 113/71  111/72  Pulse: 65 (!) 59 70 (!) 56  Resp: 18 16  17   Temp: 97.8 F (36.6 C) 97.8 F (36.6 C)  97.6 F (36.4 C)  TempSrc:  Oral  Oral  SpO2: 100% 100%  98%  Weight:      Height:       Gen: laying in bed, NAD Pulm: breathing comfortably on RA, chest tube removed, dressing is clean and dry, CTAB, symmetric breath sounds   Assessment/Plan:  Principal Problem:   Spontenous pneumothorax of right lung Active Problems:   Atelectasis  [name] with h/o exercise induced asthma who presented with spontaneous pneumothorax likely acquired after working out. S/p chest tube removal with incomplete resolution of pneumothorax.   1. Spontaneous pneumothorax, right lung: chest tube removed yesterday. CXR this morning does not show any recurrence of pneumothorax. Pain has been well controlled. He has already received bedside delivery of pain medications. Discussed need for outpt f/u with CXR.  - d/c home    Dispo: Anticipated discharge today   Ali LoweVogel, Adrain Nesbit S, MD 07/02/2018, 10:40 AM Pager: [phone]

## 2018-07-02 NOTE — Plan of Care (Signed)
Pt met and completed care plan goals, pt d/c home. Pt is stable with no new concerns. D/c instructions done with teach back, pt verbalize understanding. Pt will be transported out of the hospital by family.

## 2018-07-02 NOTE — Progress Notes (Signed)
   NAME:  Timothy Horn, MRN:  [id number], DOB:  [birth year], LOS: 16 ADMISSION DATE:  06/16/2018, CONSULTATION DATE:  1/26 REFERRING MD:  Dr. Petra Kuba, CHIEF COMPLAINT:  PTX   Brief History   33 y/o M admitted 1/26 with 2 week hx of progressive right sided chest pain with associated SOB.  CXR on admit confirmed pneumothorax.  Chest tube placed in ER  Past Medical History  Asthma  Anxiety   Significant Hospital Events   1/26  Admit with right pneumothorax  1/27  Air leak with respirations on suction 1/28  Air leak w/ cough, CT to water seal > repeat film w/ slight increase 1/29  Small residual PTX, improved from prior film 1300 1/28 1/30  Air leak improved, CXR improved.   2/04  repeat chest tube by IR planned 2/10  On water seal, lung remains up   Consults:  1/26 PCCM   Procedures:  Right Chest Tube 1/26 >> 1/31 Right CT 2/4>>  Significant Diagnostic Tests:  CXR 1/26 >> large right pneumothorax without mediastinal shift  HRCT 1/26 >> small right PTX with small bore catheter terminating in the anterior right hemithorax, volume loss & rounded consolidation in the RML, dependent consolidation with surrounding GGO in RLL, no obvious bullae or blebs.    Micro Data:    Antimicrobials:     Interim history/subjective:  No c/o.  Chest tube out yesterday.   Denies SOB, chest pain.   Objective   Blood pressure 111/72, pulse (!) 56, temperature 97.6 F (36.4 C), temperature source Oral, resp. rate 17, height 5\' 9"  (1.753 m), weight 86.2 kg, SpO2 98 %.       No intake or output data in the 24 hours ending 07/02/18 0957 Filed Weights   06/16/18 1815 06/17/18 0134  Weight: 81.6 kg 86.2 kg    Exam: General: young adult male lying in bed in NAD HEENT: MM pink/moist Neuro: AAOx4, speech clear, MAE CV: s1s2 rrr, no m/r/g PULM: resps even non labored, clear and equal bilaterally, chest tube removed 2/10 EV:OJJK, non-tender, bsx4 active  Extremities: warm/dry, no edema  Skin: no  rashes or lesions   Assessment & Plan:   Spontaneous Right Pneumothorax  -initial chest tube removed 1/31 with re-collapse.  Followed then 2nd chest tube placed per IR on 2/4.   - removed 2/10 -? If underlying asthma was contributing factor, worse than intermittent asthma P: CXR post removal 2/10 and this am 2/11 without recurrence of ptx  Can likely d/c home with outpt f/u including CXR from PCCM standpoint    Asthma -without acute exacerbation P: Consider outpatient PFT's  PRN albuterol    PCCM signing off, please call back if needed   Dirk Dress, NP 07/02/2018  9:58 AM Pager: [phone] or [phone]

## 2018-07-18 ENCOUNTER — Inpatient Hospital Stay (INDEPENDENT_AMBULATORY_CARE_PROVIDER_SITE_OTHER): Payer: Medicaid Other | Admitting: Primary Care

## 2018-08-06 ENCOUNTER — Inpatient Hospital Stay (INDEPENDENT_AMBULATORY_CARE_PROVIDER_SITE_OTHER): Payer: Medicaid Other | Admitting: Primary Care

## 2018-09-08 ENCOUNTER — Emergency Department (HOSPITAL_COMMUNITY)
Admission: EM | Admit: 2018-09-08 | Discharge: 2018-09-08 | Disposition: A | Discharge status: Home / Self Care | Payer: Self-pay | Attending: Emergency Medicine | Admitting: Emergency Medicine

## 2018-09-08 ENCOUNTER — Encounter (HOSPITAL_COMMUNITY): Payer: Self-pay

## 2018-09-08 ENCOUNTER — Emergency Department (HOSPITAL_COMMUNITY): Payer: Self-pay

## 2018-09-08 DIAGNOSIS — J45909 Unspecified asthma, uncomplicated: Secondary | ICD-10-CM | POA: Insufficient documentation

## 2018-09-08 DIAGNOSIS — Z79899 Other long term (current) drug therapy: Secondary | ICD-10-CM | POA: Insufficient documentation

## 2018-09-08 DIAGNOSIS — R0789 Other chest pain: Secondary | ICD-10-CM | POA: Insufficient documentation

## 2018-09-08 MED ORDER — IBUPROFEN 800 MG PO TABS: 800.0000 mg | ORAL_TABLET | Freq: Three times a day (TID) | ORAL | 0 refills | Status: DC

## 2018-09-08 NOTE — ED Triage Notes (Signed)
Pt reports right sided lower CP and SOB x4 days. Hx of pneumothorax. Pt denies precipitating events. Pt denies fevers, chills, or contact with sick persons. Pt alert, oriented and ambulatory. Pt took ibuprofen today for pain, denies any relief.

## 2018-09-08 NOTE — ED Notes (Signed)
Patient verbalizes understanding of discharge instructions. Opportunity for questioning and answers were provided. Armband removed by staff, pt discharged from ED. Pt ambulatory to lobby. Pt denied any other requests. Prescription and pharmacy reviewed.

## 2018-09-08 NOTE — ED Provider Notes (Signed)
[facility] EMERGENCY DEPARTMENT Provider Note   CSN: [id number] Arrival date & time: 09/08/18  1845    History   Chief Complaint Chief Complaint  Patient presents with  . Chest Pain  . Shortness of Breath    HPI Timothy Horn is a 34 y.o. male.     The history is provided by the patient. No language interpreter was used.  Chest Pain  Pain location:  R chest Pain quality: aching and stabbing   Pain radiates to:  Does not radiate Pain severity:  Moderate Duration:  2 days Timing:  Constant Progression:  Worsening Chronicity:  New Relieved by:  Nothing Worsened by:  Nothing Ineffective treatments:  None tried Associated symptoms: shortness of breath   Risk factors: male sex   Shortness of Breath  Associated symptoms: chest pain   Pt reports he has pain on the right side of his chest.  Pt reports he had a similar episode in the past and had a pneumothorax.  Pt is worried that he could have again   Past Medical History:  Diagnosis Date  . Anxiety   . Asthma     Patient Active Problem List   Diagnosis Date Noted  . Atelectasis   . Spontenous pneumothorax of right lung 06/16/2018    History reviewed. No pertinent surgical history.      Home Medications    Prior to Admission medications   Medication Sig Start Date End Date Taking? Authorizing Provider  albuterol (PROVENTIL HFA;VENTOLIN HFA) 108 (90 Base) MCG/ACT inhaler Inhale 2 puffs into the lungs every 4 (four) hours as needed for wheezing or shortness of breath. 07/02/18   Ali Lowe, MD  gabapentin (NEURONTIN) 100 MG capsule Take 2 capsules (200 mg total) by mouth 2 (two) times daily for 30 days. 06/21/18 07/21/18  Bloomfield, Carley D, DO  ibuprofen (ADVIL,MOTRIN) 200 MG tablet Take 400 mg by mouth every 6 (six) hours as needed (pain).    [provider]  loratadine-pseudoephedrine (CLARITIN-D 24-HOUR) 10-240 MG 24 hr tablet Take 1 tablet by mouth daily as needed (seasonal  allergies (springtime)).     [provider]  Multiple Vitamin (MULTIVITAMIN WITH MINERALS) TABS tablet Take 1 tablet by mouth daily.    [provider]    Family History Family History  Problem Relation Age of Onset  . Mental illness Sister   . Hyperlipidemia Maternal Grandmother     Social History Social History   Tobacco Use  . Smoking status: Never Smoker  . Smokeless tobacco: Never Used  Substance Use Topics  . Alcohol use: Yes    Alcohol/week: 2.0 standard drinks    Types: 1 Glasses of wine, 1 Shots of liquor per week    Comment: not even 1 a week  . Drug use: No     Allergies   Ativan [lorazepam] and Paxil [paroxetine hcl]   Review of Systems Review of Systems  Respiratory: Positive for shortness of breath.   Cardiovascular: Positive for chest pain.  All other systems reviewed and are negative.    Physical Exam Updated Vital Signs BP 122/77   Pulse 86   Temp 98.8 F (37.1 C) (Oral)   Resp 15   Ht 5\' 8"  (1.727 m)   Wt 81.6 kg   SpO2 100%   BMI 27.37 kg/m   Physical Exam Vitals signs and nursing note reviewed.  Constitutional:      Appearance: He is well-developed.  HENT:     Head:  Normocephalic.  Neck:     Musculoskeletal: Normal range of motion.  Cardiovascular:     Rate and Rhythm: Normal rate.     Heart sounds: Normal heart sounds.  Pulmonary:     Effort: Pulmonary effort is normal.     Breath sounds: Normal breath sounds. No decreased breath sounds.  Abdominal:     General: There is no distension.  Musculoskeletal: Normal range of motion.  Neurological:     Mental Status: He is alert and oriented to person, place, and time.  Psychiatric:        Mood and Affect: Mood normal.      ED Treatments / Results  Labs (all labs ordered are listed, but only abnormal results are displayed) Labs Reviewed - No data to display  EKG EKG Interpretation  Date/Time:  Sunday September 08 2018 18:53:57 EDT Ventricular Rate:  87  PR Interval:    QRS Duration: 79 QT Interval:  368 QTC Calculation: 443 R Axis:   72 Text Interpretation:  Sinus rhythm Baseline wander in lead(s) V4 Confirmed by Jacalyn LefevreHaviland, Julie [phone]([id number]) on 09/08/2018 6:56:30 PM   Radiology Dg Chest Port 1 View  Result Date: 09/08/2018 CLINICAL DATA:  Right lower chest pain. EXAM: PORTABLE CHEST 1 VIEW COMPARISON:  07/02/2018 FINDINGS: 2007 hours. The lungs are clear without focal pneumonia, edema, pneumothorax or pleural effusion. The cardiopericardial silhouette is within normal limits for size. The visualized bony structures of the thorax are intact. Telemetry leads overlie the chest. IMPRESSION: No active disease. Electronically Signed   By: Kennith CenterEric  Mansell M.D.   On: 09/08/2018 20:18    Procedures Procedures (including critical care time)  Medications Ordered in ED Medications - No data to display   Initial Impression / Assessment and Plan / ED Course  I have reviewed the triage vital signs and the nursing notes.  Pertinent labs & imaging results that were available during my care of the patient were reviewed by me and considered in my medical decision making (see chart for details).        MDM  Chest xray is normal.    Final Clinical Impressions(s) / ED Diagnoses   Final diagnoses:  Chest wall pain    ED Discharge Orders    None    An After Visit Summary was printed and given to the patient.    Osie CheeksSofia, Berneda Piccininni K, PA-C 09/08/18 2048    Eber HongMiller, Brian, MD 09/09/18 [phone]

## 2018-09-08 NOTE — Discharge Instructions (Addendum)
Return if any problems.

## 2019-05-27 ENCOUNTER — Other Ambulatory Visit: Payer: Self-pay

## 2019-05-27 ENCOUNTER — Encounter (HOSPITAL_COMMUNITY): Payer: Self-pay | Admitting: *Deleted

## 2019-05-27 DIAGNOSIS — K219 Gastro-esophageal reflux disease without esophagitis: Secondary | ICD-10-CM | POA: Insufficient documentation

## 2019-05-27 DIAGNOSIS — H6122 Impacted cerumen, left ear: Secondary | ICD-10-CM | POA: Insufficient documentation

## 2019-05-27 LAB — COMPREHENSIVE METABOLIC PANEL
ALT: 24 U/L (ref 0–44)
AST: 20 U/L (ref 15–41)
Albumin: 4.9 g/dL (ref 3.5–5.0)
Alkaline Phosphatase: 29 U/L — ABNORMAL LOW (ref 38–126)
Anion gap: 11 (ref 5–15)
BUN: 11 mg/dL (ref 6–20)
CO2: 28 mmol/L (ref 22–32)
Calcium: 9.6 mg/dL (ref 8.9–10.3)
Chloride: 100 mmol/L (ref 98–111)
Creatinine, Ser: 1.06 mg/dL (ref 0.61–1.24)
GFR calc Af Amer: >60 mL/min (ref >60)
GFR calc non Af Amer: >60 mL/min (ref >60)
Glucose, Bld: 90 mg/dL (ref 70–99)
Potassium: 4.3 mmol/L (ref 3.5–5.1)
Sodium: 139 mmol/L (ref 135–145)
Total Bilirubin: 1 mg/dL (ref 0.3–1.2)
Total Protein: 8.3 g/dL — ABNORMAL HIGH (ref 6.5–8.1)

## 2019-05-27 LAB — CBC
HCT: 47.1 % (ref 39.0–52.0)
Hemoglobin: 16.4 g/dL (ref 13.0–17.0)
MCH: 30.5 pg (ref 26.0–34.0)
MCHC: 34.8 g/dL (ref 30.0–36.0)
MCV: 87.7 fL (ref 80.0–100.0)
Platelets: 279 10*3/uL (ref 150–400)
RBC: 5.37 MIL/uL (ref 4.22–5.81)
RDW: 11.2 % — ABNORMAL LOW (ref 11.5–15.5)
WBC: 6.3 10*3/uL (ref 4.0–10.5)
nRBC: 0 % (ref 0.0–0.2)

## 2019-05-27 LAB — LIPASE, BLOOD: Lipase: 25 U/L (ref 11–51)

## 2019-05-27 MED ORDER — SODIUM CHLORIDE 0.9% FLUSH: 3.0000 mL | Freq: Once | INTRAVENOUS | Status: DC

## 2019-05-27 NOTE — ED Notes (Signed)
Rounding in lobby: Pt states that they ringing in his ear is aorse and that he feels a lot of pressure. V/S taken as noted Pt ambulated from cubby in lobby back to chair without assitance and with steady gait Triage RN Britt Boozer notified of same

## 2019-05-27 NOTE — ED Triage Notes (Signed)
Pt has acid reflux, has been having increased reflux with vomitng and some ? Blood noted . Left ear ringing

## 2019-05-28 ENCOUNTER — Emergency Department (HOSPITAL_COMMUNITY)
Admission: EM | Admit: 2019-05-28 | Discharge: 2019-05-28 | Disposition: A | Discharge status: Home / Self Care | Payer: Self-pay | Attending: Emergency Medicine | Admitting: Emergency Medicine

## 2019-05-28 DIAGNOSIS — K219 Gastro-esophageal reflux disease without esophagitis: Secondary | ICD-10-CM

## 2019-05-28 DIAGNOSIS — H6122 Impacted cerumen, left ear: Secondary | ICD-10-CM

## 2019-05-28 HISTORY — DX: Gastro-esophageal reflux disease without esophagitis: K21.9

## 2019-05-28 MED ORDER — ALUM & MAG HYDROXIDE-SIMETH 200-200-20 MG/5ML PO SUSP
30.0000 mL | Freq: Once | ORAL | Status: AC
  Administered 2019-05-28: 30 mL via ORAL
  Filled 2019-05-28: qty 30

## 2019-05-28 MED ORDER — OMEPRAZOLE 20 MG PO CPDR: 20.0000 mg | DELAYED_RELEASE_CAPSULE | Freq: Every day | ORAL | 0 refills | Status: DC

## 2019-05-28 NOTE — ED Provider Notes (Signed)
[facility]-EMERGENCY DEPT Provider Note   CSN: [id number] Arrival date & time: 05/27/19  1617     History Chief Complaint  Patient presents with  . Abdominal Pain    Timothy Horn is a 34 y.o. male.  The history is provided by the patient.  Gastroesophageal Reflux This is a new problem. The current episode started more than 1 week ago. The problem occurs daily. The problem has been gradually worsening. Associated symptoms include chest pain. Pertinent negatives include no abdominal pain. The symptoms are aggravated by swallowing. Nothing relieves the symptoms.   Patient with history of anxiety, asthma, GERD presents with symptoms of esophageal reflux.  He reports over the past week he has had painful swallowing.  He reports that sometimes he will have small bout of vomiting after eating.  He will have pain with eating.  He reports at times there is blood mixed in when he vomits, but no large blood clots.  No fevers.  He also reports over the past day his left ear has had ringing and he had difficulty hearing out of it    Past Medical History:  Diagnosis Date  . Anxiety   . Asthma   . GERD (gastroesophageal reflux disease)     Patient Active Problem List   Diagnosis Date Noted  . Atelectasis   . Spontenous pneumothorax of right lung 06/16/2018    History reviewed. No pertinent surgical history.     Family History  Problem Relation Age of Onset  . Mental illness Sister   . Hyperlipidemia Maternal Grandmother     Social History   Tobacco Use  . Smoking status: Never Smoker  . Smokeless tobacco: Never Used  Substance Use Topics  . Alcohol use: Yes    Alcohol/week: 2.0 standard drinks    Types: 1 Glasses of wine, 1 Shots of liquor per week    Comment: not even 1 a week  . Drug use: No    Home Medications Prior to Admission medications   Medication Sig Start Date End Date Taking? Authorizing Provider  albuterol (PROVENTIL HFA;VENTOLIN  HFA) 108 (90 Base) MCG/ACT inhaler Inhale 2 puffs into the lungs every 4 (four) hours as needed for wheezing or shortness of breath. 07/02/18  Yes Ali Lowe, MD  loratadine-pseudoephedrine (CLARITIN-D 24-HOUR) 10-240 MG 24 hr tablet Take 1 tablet by mouth daily as needed (seasonal allergies (springtime)).    Yes [provider]  gabapentin (NEURONTIN) 100 MG capsule Take 2 capsules (200 mg total) by mouth 2 (two) times daily for 30 days. 06/21/18 07/21/18  Bloomfield, Carley D, DO  omeprazole (PRILOSEC) 20 MG capsule Take 1 capsule (20 mg total) by mouth daily. 05/28/19   Zadie Rhine, MD    Allergies    Ativan [lorazepam] and Paxil [paroxetine hcl]  Review of Systems   Review of Systems  Constitutional: Negative for fever.  HENT: Positive for hearing loss and tinnitus.   Cardiovascular: Positive for chest pain.  Gastrointestinal: Negative for abdominal pain.  All other systems reviewed and are negative.   Physical Exam Updated Vital Signs BP 127/88 (BP Location: Left Arm)   Pulse 64   Temp 99.2 F (37.3 C) (Oral)   Resp 15   Ht 1.753 m (5\' 9" )   Wt 83.9 kg   SpO2 100%   BMI 27.32 kg/m   Physical Exam CONSTITUTIONAL: Well developed/well nourished HEAD: Normocephalic/atraumatic EYES: EOMI/PERRL ENMT: Mucous membranes moist, left TM occluded by cerumen, ears symmetric.  Right TM  clear and intact Uvula midline with mild erythema, no edema, no drooling, no blood noted in mouth NECK: supple no meningeal signs SPINE/BACK:entire spine nontender CV: S1/S2 noted, no murmurs/rubs/gallops noted LUNGS: Lungs are clear to auscultation bilaterally, no apparent distress ABDOMEN: soft, nontender  NEURO: Pt is awake/alert/appropriate, moves all extremitiesx4.  No facial droop.   EXTREMITIES: pulses normal/equal, full ROM SKIN: warm, color normal PSYCH: no abnormalities of mood noted, alert and oriented to situation  ED Results / Procedures / Treatments   Labs (all labs  ordered are listed, but only abnormal results are displayed) Labs Reviewed  COMPREHENSIVE METABOLIC PANEL - Abnormal; Notable for the following components:      Result Value   Total Protein 8.3 (*)    Alkaline Phosphatase 29 (*)    All other components within normal limits  CBC - Abnormal; Notable for the following components:   RDW 11.2 (*)    All other components within normal limits  LIPASE, BLOOD    EKG None  Radiology No results found.  Procedures Procedures    Medications Ordered in ED Medications  alum & mag hydroxide-simeth (MAALOX/MYLANTA) 200-200-20 MG/5ML suspension 30 mL (has no administration in time range)    ED Course  I have reviewed the triage vital signs and the nursing notes.  Pertinent labs   results that were available during my care of the patient were reviewed by me and considered in my medical decision making (see chart for details).    MDM Rules/Calculators/A&P                      Patient well-appearing.  Patient likely has esophageal reflux.  He only has pain with swallowing.  He is in no acute distress.  No active pain at this time.  He will be advised to stop all NSAIDs, will start on a PPI, refer to GI.  Low suspicion for acute GI bleed or any other acute abdominal emergency.  Advised he can treat his cerumen impaction at home Final Clinical Impression(s) / ED Diagnoses Final diagnoses:  Impacted cerumen of left ear  Gastroesophageal reflux disease, unspecified whether esophagitis present    Rx / DC Orders ED Discharge Orders         Ordered    omeprazole (PRILOSEC) 20 MG capsule  Daily     05/28/19 0502           Ripley Fraise, MD 05/28/19 0505

## 2019-05-28 NOTE — ED Notes (Signed)
Pt verbalized discharge instructions and follow up care. Alert and ambulatory. No IV.  

## 2019-05-28 NOTE — ED Notes (Signed)
Wickline, MD at bedside.  

## 2019-05-28 NOTE — ED Notes (Signed)
Pt placed on the monitor, dressed into a gown, given a warm blanket and has call bell within reach.

## 2019-06-10 ENCOUNTER — Emergency Department (HOSPITAL_COMMUNITY)
Admission: EM | Admit: 2019-06-10 | Discharge: 2019-06-10 | Disposition: A | Discharge status: Home / Self Care | Payer: Medicaid Other | Attending: Emergency Medicine | Admitting: Emergency Medicine

## 2019-06-10 ENCOUNTER — Other Ambulatory Visit: Payer: Self-pay

## 2019-06-10 ENCOUNTER — Emergency Department (HOSPITAL_COMMUNITY): Payer: Self-pay

## 2019-06-10 ENCOUNTER — Encounter (HOSPITAL_COMMUNITY): Payer: Self-pay | Admitting: Emergency Medicine

## 2019-06-10 DIAGNOSIS — R1011 Right upper quadrant pain: Secondary | ICD-10-CM | POA: Insufficient documentation

## 2019-06-10 DIAGNOSIS — K219 Gastro-esophageal reflux disease without esophagitis: Secondary | ICD-10-CM | POA: Insufficient documentation

## 2019-06-10 LAB — CBC WITH DIFFERENTIAL/PLATELET
Abs Immature Granulocytes: 0.01 10*3/uL (ref 0.00–0.07)
Basophils Absolute: 0 10*3/uL (ref 0.0–0.1)
Basophils Relative: 1 %
Eosinophils Absolute: 0 10*3/uL (ref 0.0–0.5)
Eosinophils Relative: 0 %
HCT: 44.5 % (ref 39.0–52.0)
Hemoglobin: 15.8 g/dL (ref 13.0–17.0)
Immature Granulocytes: 0 %
Lymphocytes Relative: 25 %
Lymphs Abs: 1.2 10*3/uL (ref 0.7–4.0)
MCH: 30.6 pg (ref 26.0–34.0)
MCHC: 35.5 g/dL (ref 30.0–36.0)
MCV: 86.2 fL (ref 80.0–100.0)
Monocytes Absolute: 0.2 10*3/uL (ref 0.1–1.0)
Monocytes Relative: 5 %
Neutro Abs: 3.4 10*3/uL (ref 1.7–7.7)
Neutrophils Relative %: 69 %
Platelets: 267 10*3/uL (ref 150–400)
RBC: 5.16 MIL/uL (ref 4.22–5.81)
RDW: 11 % — ABNORMAL LOW (ref 11.5–15.5)
WBC: 5 10*3/uL (ref 4.0–10.5)
nRBC: 0 % (ref 0.0–0.2)

## 2019-06-10 LAB — COMPREHENSIVE METABOLIC PANEL
ALT: 18 U/L (ref 0–44)
AST: 21 U/L (ref 15–41)
Albumin: 4.9 g/dL (ref 3.5–5.0)
Alkaline Phosphatase: 25 U/L — ABNORMAL LOW (ref 38–126)
Anion gap: 14 (ref 5–15)
BUN: 11 mg/dL (ref 6–20)
CO2: 21 mmol/L — ABNORMAL LOW (ref 22–32)
Calcium: 9.3 mg/dL (ref 8.9–10.3)
Chloride: 99 mmol/L (ref 98–111)
Creatinine, Ser: 1.23 mg/dL (ref 0.61–1.24)
GFR calc Af Amer: >60 mL/min (ref >60)
GFR calc non Af Amer: >60 mL/min (ref >60)
Glucose, Bld: 74 mg/dL (ref 70–99)
Potassium: 4.3 mmol/L (ref 3.5–5.1)
Sodium: 134 mmol/L — ABNORMAL LOW (ref 135–145)
Total Bilirubin: 1.6 mg/dL — ABNORMAL HIGH (ref 0.3–1.2)
Total Protein: 7.5 g/dL (ref 6.5–8.1)

## 2019-06-10 LAB — LIPASE, BLOOD: Lipase: 21 U/L (ref 11–51)

## 2019-06-10 MED ORDER — ALUM & MAG HYDROXIDE-SIMETH 200-200-20 MG/5ML PO SUSP
30.0000 mL | Freq: Once | ORAL | Status: AC
  Administered 2019-06-10: 30 mL via ORAL
  Filled 2019-06-10: qty 30

## 2019-06-10 MED ORDER — FAMOTIDINE 20 MG PO TABS: 20.0000 mg | ORAL_TABLET | Freq: Two times a day (BID) | ORAL | 0 refills | Status: DC

## 2019-06-10 MED ORDER — SUCRALFATE 1 GM/10ML PO SUSP
1.0000 g | Freq: Three times a day (TID) | ORAL | Status: DC
  Administered 2019-06-10: 17:00:00 1 g via ORAL
  Filled 2019-06-10 (×3): qty 10

## 2019-06-10 MED ORDER — PANTOPRAZOLE SODIUM 40 MG IV SOLR
40.0000 mg | Freq: Once | INTRAVENOUS | Status: AC
  Administered 2019-06-10: 40 mg via INTRAVENOUS
  Filled 2019-06-10: qty 40

## 2019-06-10 MED ORDER — SUCRALFATE 1 GM/10ML PO SUSP: 1.0000 g | Freq: Three times a day (TID) | ORAL | 0 refills | Status: DC

## 2019-06-10 MED ORDER — SODIUM CHLORIDE 0.9 % IV BOLUS
1000.0000 mL | Freq: Once | INTRAVENOUS | Status: AC
  Administered 2019-06-10: 1000 mL via INTRAVENOUS

## 2019-06-10 MED ORDER — FAMOTIDINE IN NACL 20-0.9 MG/50ML-% IV SOLN
20.0000 mg | Freq: Once | INTRAVENOUS | Status: AC
  Administered 2019-06-10: 20 mg via INTRAVENOUS
  Filled 2019-06-10: qty 50

## 2019-06-10 NOTE — ED Triage Notes (Signed)
Pt to triage via [facility] from home.  Reports acid reflux and SOB x 2 weeks that has increased over the last 2 days with vomiting.  Taking Prilosec and Tums without relief.  Appt with GI tomorrow.  CBG 106

## 2019-06-10 NOTE — ED Provider Notes (Signed)
[facility] EMERGENCY DEPARTMENT Provider Note   CSN: [id number] Arrival date & time: 06/10/19  1351     History Chief Complaint  Patient presents with  . Gastroesophageal Reflux  . Shortness of Breath    Timothy Horn is a 34 y.o. male.  HPI   Patient is a 34 year old male with a history of anxiety, asthma, GERD, who presents the emergency department today complaining of acid reflux and shortness of breath.  States that he has been having issues of shortness of breath for the last 2 weeks.  He has a burning sensation to the middle of his chest which seems to be worse after eating anything.  He also reports that he has had some associated shortness of breath that occurs only when he eats.  He has no dyspnea on exertion or persistent shortness of breath.  He takes an albuterol inhaler for his asthma which he states works a little bit but does not completely resolve his symptoms.  He was seen in the ED earlier this month for his symptoms and was referred to GI.  He states he has an appointment tomorrow with low [facility].  He currently has been taking Prilosec, Tums, Mylanta and is also tried dietary changes without significant relief.  He reports associated nausea, vomiting and some epigastric abdominal pain.  He denies diarrhea, constipation, cough, fevers.   Past Medical History:  Diagnosis Date  . Anxiety   . Asthma   . GERD (gastroesophageal reflux disease)     Patient Active Problem List   Diagnosis Date Noted  . Atelectasis   . Spontenous pneumothorax of right lung 06/16/2018    History reviewed. No pertinent surgical history.     Family History  Problem Relation Age of Onset  . Mental illness Sister   . Hyperlipidemia Maternal Grandmother     Social History   Tobacco Use  . Smoking status: Never Smoker  . Smokeless tobacco: Never Used  Substance Use Topics  . Alcohol use: Yes    Alcohol/week: 2.0 standard drinks    Types: 1 Glasses of wine,  1 Shots of liquor per week    Comment: not even 1 a week  . Drug use: No    Home Medications Prior to Admission medications   Medication Sig Start Date End Date Taking? Authorizing Provider  albuterol (PROVENTIL HFA;VENTOLIN HFA) 108 (90 Base) MCG/ACT inhaler Inhale 2 puffs into the lungs every 4 (four) hours as needed for wheezing or shortness of breath. 07/02/18   Ali Lowe, MD  famotidine (PEPCID) 20 MG tablet Take 1 tablet (20 mg total) by mouth 2 (two) times daily for 14 days. 06/10/19 06/24/19  Teonia Yager S, PA-C  gabapentin (NEURONTIN) 100 MG capsule Take 2 capsules (200 mg total) by mouth 2 (two) times daily for 30 days. 06/21/18 07/21/18  Bloomfield, Carley D, DO  loratadine-pseudoephedrine (CLARITIN-D 24-HOUR) 10-240 MG 24 hr tablet Take 1 tablet by mouth daily as needed (seasonal allergies (springtime)).     [provider]  omeprazole (PRILOSEC) 20 MG capsule Take 1 capsule (20 mg total) by mouth daily. 05/28/19   Zadie Rhine, MD  sucralfate (CARAFATE) 1 GM/10ML suspension Take 10 mLs (1 g total) by mouth 4 (four) times daily -  with meals and at bedtime. 06/10/19   Andra Matsuo S, PA-C    Allergies    Ativan [lorazepam] and Paxil [paroxetine hcl]  Review of Systems   Review of Systems  Constitutional: Negative for chills and  fever.  HENT: Negative for ear pain and sore throat.   Eyes: Negative for pain and visual disturbance.  Respiratory: Positive for shortness of breath. Negative for cough.   Cardiovascular: Positive for chest pain.  Gastrointestinal: Positive for nausea and vomiting. Negative for abdominal pain, constipation and diarrhea.  Genitourinary: Negative for dysuria and hematuria.  Musculoskeletal: Negative for back pain.  Skin: Negative for color change and rash.  Neurological: Negative for seizures and syncope.  All other systems reviewed and are negative.   Physical Exam Updated Vital Signs BP 122/69 (BP Location: Left Arm)   Pulse 90    Temp 98.6 F (37 C) (Oral)   Resp 17   SpO2 98%   Physical Exam Vitals and nursing note reviewed.  Constitutional:      General: He is not in acute distress.    Appearance: He is well-developed. He is not ill-appearing or toxic-appearing.  HENT:     Head: Normocephalic and atraumatic.  Eyes:     Conjunctiva/sclera: Conjunctivae normal.  Cardiovascular:     Rate and Rhythm: Normal rate and regular rhythm.     Heart sounds: Normal heart sounds. No murmur.  Pulmonary:     Effort: Pulmonary effort is normal. No respiratory distress.     Breath sounds: Normal breath sounds. No decreased breath sounds, wheezing, rhonchi or rales.  Abdominal:     Palpations: Abdomen is soft.     Tenderness: There is abdominal tenderness (mild, ruq and epigastric). There is no guarding or rebound.  Musculoskeletal:     Cervical back: Neck supple.  Skin:    General: Skin is warm and dry.  Neurological:     Mental Status: He is alert.     ED Results / Procedures / Treatments   Labs (all labs ordered are listed, but only abnormal results are displayed) Labs Reviewed  CBC WITH DIFFERENTIAL/PLATELET - Abnormal; Notable for the following components:      Result Value   RDW 11.0 (*)    All other components within normal limits  COMPREHENSIVE METABOLIC PANEL - Abnormal; Notable for the following components:   Sodium 134 (*)    CO2 21 (*)    Alkaline Phosphatase 25 (*)    Total Bilirubin 1.6 (*)    All other components within normal limits  LIPASE, BLOOD    EKG EKG Interpretation  Date/Time:  Tuesday June 10 2019 14:00:56 EST Ventricular Rate:  91 PR Interval:  112 QRS Duration: 76 QT Interval:  354 QTC Calculation: 435 R Axis:   58 Text Interpretation: Normal sinus rhythm with sinus arrhythmia Normal ECG No significant change since last tracing Confirmed by Gwyneth Sprout ([id number]) on 06/10/2019 4:16:01 PM   Radiology DG Chest 2 View  Result Date: 06/10/2019 CLINICAL DATA:   Shortness of breath for 2 weeks. EXAM: CHEST - 2 VIEW COMPARISON:  None. FINDINGS: The heart size and mediastinal contours are within normal limits. Both lungs are clear. The visualized skeletal structures are unremarkable. IMPRESSION: Normal chest x-ray. Electronically Signed   By: Rudie Meyer M.D.   On: 06/10/2019 16:42   US Abdomen Limited RUQ  Result Date: 06/10/2019 CLINICAL DATA:  Upper abdominal pain EXAM: ULTRASOUND ABDOMEN LIMITED RIGHT UPPER QUADRANT COMPARISON:  None. FINDINGS: Gallbladder: No gallstones or wall thickening visualized. There is no pericholecystic fluid. No sonographic Murphy sign noted by sonographer. Common bile duct: Diameter: 5 mm. No intrahepatic or extrahepatic biliary duct dilatation. Liver: No focal lesion identified. Within normal limits in parenchymal echogenicity. Portal  vein is patent on color Doppler imaging with normal direction of blood flow towards the liver. Other: None. IMPRESSION: Study within normal limits. Electronically Signed   By: Lowella Grip III M.D.   On: 06/10/2019 17:44    Procedures Procedures (including critical care time)  Medications Ordered in ED Medications  sucralfate (CARAFATE) 1 GM/10ML suspension 1 g (1 g Oral Given 06/10/19 1658)  alum & mag hydroxide-simeth (MAALOX/MYLANTA) 200-200-20 MG/5ML suspension 30 mL (30 mLs Oral Given 06/10/19 1605)  pantoprazole (PROTONIX) injection 40 mg (40 mg Intravenous Given 06/10/19 1605)  famotidine (PEPCID) IVPB 20 mg premix (0 mg Intravenous Stopped 06/10/19 1757)  sodium chloride 0.9 % bolus 1,000 mL (0 mLs Intravenous Stopped 06/10/19 1757)    ED Course  I have reviewed the triage vital signs and the nursing notes.  Pertinent labs & imaging results that were available during my care of the patient were reviewed by me and considered in my medical decision making (see chart for details).    MDM Rules/Calculators/A&P                     34 year old male presenting for evaluation of acid  reflux symptoms ongoing for the last 2 weeks.  He has tried dietary changes and medications without significant relief.  Has a GI appointment tomorrow.  Also reports shortness of breath that seems to be associated with exacerbation of his GERD.  Patient's vital signs are reassuring.  His exam is also reassuring.  Normal lung sounds bilaterally.  Heart with regular rate and rhythm.  He does have some minimal abdominal tenderness to the epigastrium and right upper quadrant.  CBC is without leukocytosis or anemia CMP with with very mild hyponatremia, normal creatinine and liver function.  Slightly elevated total bilirubin which is increased from 2 weeks ago. Lipase is negative  EKG Normal sinus rhythm with sinus arrhythmia Normal ECG No significant change since last tracing   Chest x-ray without evidence of pneumonia, pneumothorax or other other abnormality.  Right upper quadrant ultrasound was neg for cholecystitis or gallstones  Patient given IV fluids, Pepcid, Protonix, Carafate and a GI cocktail.  On reassessment he states he still has some fb sensation in his throat but he does not feel nauseated and has been able to tolerate water.  He was given information regarding dietary changes to help improve symptoms.  We will also start him on Pepcid and Carafate at home.  Advised to continue his PPI.  Advised to follow-up with GI as scheduled tomorrow and return to the ED for new or worsening symptoms.  He voiced understand plan and reasons to return.  Questions answered.  Patient stable for discharge.  Final Clinical Impression(s) / ED Diagnoses Final diagnoses:  RUQ pain  Gastroesophageal reflux disease, unspecified whether esophagitis present    Rx / DC Orders ED Discharge Orders         Ordered    famotidine (PEPCID) 20 MG tablet  2 times daily     06/10/19 1834    sucralfate (CARAFATE) 1 GM/10ML suspension  3 times daily with meals & bedtime     06/10/19 1834           Rodney Booze, PA-C 06/10/19 1835    Blanchie Dessert, MD 06/11/19 1115

## 2019-06-10 NOTE — Discharge Instructions (Signed)
Please continue taking the medication that you have at home for your acid reflux.  You are also given a prescription for Carafate and Pepcid to take as well.  Please keep your appointment with the GI doctor tomorrow as scheduled.  Return to the emergency department for severe abdominal pain, severe chest pain, inability to tolerate fluids or food, or any new or worsening symptoms.

## 2019-06-11 ENCOUNTER — Encounter: Payer: Self-pay | Admitting: Gastroenterology

## 2019-06-11 ENCOUNTER — Ambulatory Visit: Payer: Self-pay | Admitting: Gastroenterology

## 2019-06-11 ENCOUNTER — Other Ambulatory Visit (INDEPENDENT_AMBULATORY_CARE_PROVIDER_SITE_OTHER): Payer: Medicaid Other

## 2019-06-11 VITALS — BP 110/80 | HR 72 | Temp 97.6°F | Ht 69.0 in | Wt 185.0 lb

## 2019-06-11 DIAGNOSIS — R945 Abnormal results of liver function studies: Secondary | ICD-10-CM

## 2019-06-11 DIAGNOSIS — R634 Abnormal weight loss: Secondary | ICD-10-CM

## 2019-06-11 DIAGNOSIS — R1013 Epigastric pain: Secondary | ICD-10-CM

## 2019-06-11 DIAGNOSIS — K219 Gastro-esophageal reflux disease without esophagitis: Secondary | ICD-10-CM

## 2019-06-11 DIAGNOSIS — R1310 Dysphagia, unspecified: Secondary | ICD-10-CM

## 2019-06-11 DIAGNOSIS — R7989 Other specified abnormal findings of blood chemistry: Secondary | ICD-10-CM

## 2019-06-11 DIAGNOSIS — R748 Abnormal levels of other serum enzymes: Secondary | ICD-10-CM

## 2019-06-11 DIAGNOSIS — Z01818 Encounter for other preprocedural examination: Secondary | ICD-10-CM

## 2019-06-11 DIAGNOSIS — R1110 Vomiting, unspecified: Secondary | ICD-10-CM

## 2019-06-11 LAB — IGA: IgA: 137 mg/dL (ref 68–378)

## 2019-06-11 LAB — FERRITIN: Ferritin: 375.9 ng/mL — ABNORMAL HIGH (ref 22.0–322.0)

## 2019-06-11 MED ORDER — OMEPRAZOLE 40 MG PO CPDR: 40.0000 mg | DELAYED_RELEASE_CAPSULE | Freq: Two times a day (BID) | ORAL | 3 refills | Status: DC

## 2019-06-11 NOTE — Patient Instructions (Signed)
If you are age 35 or older, your body mass index should be between 23-30. Your Body mass index is 27.32 kg/m. If this is out of the aforementioned range listed, please consider follow up with your Primary Care Provider.  If you are age 50 or younger, your body mass index should be between 19-25. Your Body mass index is 27.32 kg/m. If this is out of the aformentioned range listed, please consider follow up with your Primary Care Provider.    You have been scheduled for an endoscopy. Please follow written instructions given to you at your visit today. If you use inhalers (even only as needed), please bring them with you on the day of your procedure.   Your provider has requested that you go to the basement level for lab work before leaving today. Press "B" on the elevator. The lab is located at the first door on the left as you exit the elevator.  We will send Omeprazole to your pharmacy   Gastroesophageal Reflux Disease, Adult Gastroesophageal reflux (GER) happens when acid from the stomach flows up into the tube that connects the mouth and the stomach (esophagus). Normally, food travels down the esophagus and stays in the stomach to be digested. However, when a person has GER, food and stomach acid sometimes move back up into the esophagus. If this becomes a more serious problem, the person may be diagnosed with a disease called gastroesophageal reflux disease (GERD). GERD occurs when the reflux:  Happens often.  Causes frequent or severe symptoms.  Causes problems such as damage to the esophagus. When stomach acid comes in contact with the esophagus, the acid may cause soreness (inflammation) in the esophagus. Over time, GERD may create small holes (ulcers) in the lining of the esophagus. What are the causes? This condition is caused by a problem with the muscle between the esophagus and the stomach (lower esophageal sphincter, or LES). Normally, the LES muscle closes after food passes  through the esophagus to the stomach. When the LES is weakened or abnormal, it does not close properly, and that allows food and stomach acid to go back up into the esophagus. The LES can be weakened by certain dietary substances, medicines, and medical conditions, including:  Tobacco use.  Pregnancy.  Having a hiatal hernia.  Alcohol use.  Certain foods and beverages, such as coffee, chocolate, onions, and peppermint. What increases the risk? You are more likely to develop this condition if you:  Have an increased body weight.  Have a connective tissue disorder.  Use NSAID medicines. What are the signs or symptoms? Symptoms of this condition include:  Heartburn.  Difficult or painful swallowing.  The feeling of having a lump in the throat.  Abitter taste in the mouth.  Bad breath.  Having a large amount of saliva.  Having an upset or bloated stomach.  Belching.  Chest pain. Different conditions can cause chest pain. Make sure you see your health care provider if you experience chest pain.  Shortness of breath or wheezing.  Ongoing (chronic) cough or a night-time cough.  Wearing away of tooth enamel.  Weight loss. How is this diagnosed? Your health care provider will take a medical history and perform a physical exam. To determine if you have mild or severe GERD, your health care provider may also monitor how you respond to treatment. You may also have tests, including:  A test to examine your stomach and esophagus with a small camera (endoscopy).  A test thatmeasures the  acidity level in your esophagus.  A test thatmeasures how much pressure is on your esophagus.  A barium swallow or modified barium swallow test to show the shape, size, and functioning of your esophagus. How is this treated? The goal of treatment is to help relieve your symptoms and to prevent complications. Treatment for this condition may vary depending on how severe your symptoms are.  Your health care provider may recommend:  Changes to your diet.  Medicine.  Surgery. Follow these instructions at home: Eating and drinking   Follow a diet as recommended by your health care provider. This may involve avoiding foods and drinks such as: ? Coffee and tea (with or without caffeine). ? Drinks that containalcohol. ? Energy drinks and sports drinks. ? Carbonated drinks or sodas. ? Chocolate and cocoa. ? Peppermint and mint flavorings. ? Garlic and onions. ? Horseradish. ? Spicy and acidic foods, including peppers, chili powder, curry powder, vinegar, hot sauces, and barbecue sauce. ? Citrus fruit juices and citrus fruits, such as oranges, lemons, and limes. ? Tomato-based foods, such as red sauce, chili, salsa, and pizza with red sauce. ? Fried and fatty foods, such as donuts, french fries, potato chips, and high-fat dressings. ? High-fat meats, such as hot dogs and fatty cuts of red and white meats, such as rib eye steak, sausage, ham, and bacon. ? High-fat dairy items, such as whole milk, butter, and cream cheese.  Eat small, frequent meals instead of large meals.  Avoid drinking large amounts of liquid with your meals.  Avoid eating meals during the 2-3 hours before bedtime.  Avoid lying down right after you eat.  Do not exercise right after you eat. Lifestyle   Do not use any products that contain nicotine or tobacco, such as cigarettes, e-cigarettes, and chewing tobacco. If you need help quitting, ask your health care provider.  Try to reduce your stress by using methods such as yoga or meditation. If you need help reducing stress, ask your health care provider.  If you are overweight, reduce your weight to an amount that is healthy for you. Ask your health care provider for guidance about a safe weight loss goal. General instructions  Pay attention to any changes in your symptoms.  Take over-the-counter and prescription medicines only as told by your  health care provider. Do not take aspirin, ibuprofen, or other NSAIDs unless your health care provider told you to do so.  Wear loose-fitting clothing. Do not wear anything tight around your waist that causes pressure on your abdomen.  Raise (elevate) the head of your bed about 6 inches (15 cm).  Avoid bending over if this makes your symptoms worse.  Keep all follow-up visits as told by your health care provider. This is important. Contact a health care provider if:  You have: ? New symptoms. ? Unexplained weight loss. ? Difficulty swallowing or it hurts to swallow. ? Wheezing or a persistent cough. ? A hoarse voice.  Your symptoms do not improve with treatment. Get help right away if you:  Have pain in your arms, neck, jaw, teeth, or back.  Feel sweaty, dizzy, or light-headed.  Have chest pain or shortness of breath.  Vomit and your vomit looks like blood or coffee grounds.  Faint.  Have stool that is bloody or black.  Cannot swallow, drink, or eat. Summary  Gastroesophageal reflux happens when acid from the stomach flows up into the esophagus. GERD is a disease in which the reflux happens often, causes frequent  or severe symptoms, or causes problems such as damage to the esophagus.  Treatment for this condition may vary depending on how severe your symptoms are. Your health care provider may recommend diet and lifestyle changes, medicine, or surgery.  Contact a health care provider if you have new or worsening symptoms.  Take over-the-counter and prescription medicines only as told by your health care provider. Do not take aspirin, ibuprofen, or other NSAIDs unless your health care provider told you to do so.  Keep all follow-up visits as told by your health care provider. This is important. This information is not intended to replace advice given to you by your health care provider. Make sure you discuss any questions you have with your health care provider. Document  Revised: 11/14/2017 Document Reviewed: 11/14/2017 Elsevier Patient Education  2020 [employer].   I appreciate the  opportunity to care for you  Thank You   Marsa Aris , MD

## 2019-06-11 NOTE — Progress Notes (Signed)
Timothy Horn    [id number]    September 07, 1985  Primary Care Physician:Patient, No Pcp Per  Referring Physician: No referring provider defined for this encounter.   Chief complaint: Heartburn, difficulty swallowing, weight loss  HPI:  34 year old male here with complaints of severe burning sensation in the throat and also has sensation of food getting lodged in the throat when he eats.  Pain is worse after eating.  He has lost over 10 pounds in the past month.  He has been having intermittent heartburn, nausea and vomiting for the past few months and is progressively worse in the past 2 weeks. He has multiple ER visits, was there yesterday for worsening reflux symptoms, difficulty swallowing, shortness of breath and upper abdominal pain. Chest x-ray was normal and right upper quadrant ultrasound negative for gallbladder disease and showed normal liver parenchyma. He has been taking antacids, Pepcid and Prilosec 20 mg daily on regular basis in the past week with no improvement.  No family history of GI malignancy, liver disease or autoimmune disease  Review of system positive for seasonal allergies.  Denies any significant food allergies  He had spontaneous pneumothorax last year.  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  AST and ALT within normal limits.  Total bilirubin was slightly elevated at 1.6 yesterday but has been within normal range in the past.  Denies excessive use of alcohol and also denies use of any recreational drugs.  No regular use of NSAIDs or any over-the-counter herbal remedies    Outpatient Encounter Medications as of 06/11/2019  Medication Sig  . albuterol (PROVENTIL HFA;VENTOLIN HFA) 108 (90 Base) MCG/ACT inhaler Inhale 2 puffs into the lungs every 4 (four) hours as needed for wheezing or shortness of breath.  . ALPRAZolam (XANAX) 0.5 MG tablet Take 1 tablet by mouth as needed.  . calcium carbonate (TUMS EX) 750 MG  chewable tablet Chew by mouth as directed.  . famotidine (PEPCID) 20 MG tablet Take 1 tablet (20 mg total) by mouth 2 (two) times daily for 14 days.  [name] loratadine-pseudoephedrine (CLARITIN-D 24-HOUR) 10-240 MG 24 hr tablet Take 1 tablet by mouth daily as needed (seasonal allergies (springtime)).   [name] omeprazole (PRILOSEC) 20 MG capsule Take 1 capsule (20 mg total) by mouth daily.  . sucralfate (CARAFATE) 1 GM/10ML suspension Take 10 mLs (1 g total) by mouth 4 (four) times daily -  with meals and at bedtime.  . [DISCONTINUED] gabapentin (NEURONTIN) 100 MG capsule Take 2 capsules (200 mg total) by mouth 2 (two) times daily for 30 days.   No facility-administered encounter medications on file as of 06/11/2019.    Allergies as of 06/11/2019 - Review Complete 06/11/2019  Allergen Reaction Noted  . Ativan [lorazepam] Anxiety and Other (See Comments) 11/30/2014  . Paxil [paroxetine hcl] Anxiety and Other (See Comments) 11/30/2014    Past Medical History:  Diagnosis Date  . Anxiety   . Asthma   . GERD (gastroesophageal reflux disease)     No past surgical history on file.  Family History  Problem Relation Age of Onset  . Mental illness Sister   . Hyperlipidemia Maternal Grandmother     Social History   Socioeconomic History  . Marital status: Married    Spouse name: Programmer, applications  . Number of children: 1  . Years of education: Not on file  . Highest education level: Not on file  Occupational History  . Not  on file  Tobacco Use  . Smoking status: Never Smoker  . Smokeless tobacco: Never Used  Substance and Sexual Activity  . Alcohol use: Yes    Alcohol/week: 2.0 standard drinks    Types: 1 Glasses of wine, 1 Shots of liquor per week    Comment: not even 1 a week  . Drug use: No  . Sexual activity: Yes    Partners: Female  Other Topics Concern  . Not on file  Social History Narrative  . Not on file   Social Determinants of Health   Financial Resource Strain: Low Risk     . Difficulty of Paying Living Expenses: Not very hard  Food Insecurity: No Food Insecurity  . Worried About Programme researcher, broadcasting/film/video in the Last Year: Never true  . Ran Out of Food in the Last Year: Never true  Transportation Needs: No Transportation Needs  . Lack of Transportation (Medical): No  . Lack of Transportation (Non-Medical): No  Physical Activity: Sufficiently Active  . Days of Exercise per Week: 4 days  . Minutes of Exercise per Session: 60 min  Stress: No Stress Concern Present  . Feeling of Stress : Not at all  Social Connections: Somewhat Isolated  . Frequency of Communication with Friends and Family: More than three times a week  . Frequency of Social Gatherings with Friends and Family: Once a week  . Attends Religious Services: Never  . Active Member of Clubs or Organizations: No  . Attends Banker Meetings: Never  . Marital Status: Married  Catering manager Violence: Not At Risk  . Fear of Current or Ex-Partner: No  . Emotionally Abused: No  . Physically Abused: No  . Sexually Abused: No      Review of systems: Review of Systems  Constitutional: Negative for fever and chills.  Positive for fatigue HENT: Negative.   Eyes: Negative for blurred vision.  Respiratory: Negative for cough, shortness of breath and wheezing.   Cardiovascular: Negative for chest pain and palpitations.  Gastrointestinal: as per HPI Genitourinary: Negative for dysuria, urgency, frequency and hematuria.  Musculoskeletal: Negative for myalgias, back pain and joint pain.  Skin: Negative for itching and rash.  Neurological: Negative for dizziness, tremors, focal weakness, seizures and loss of consciousness.  Endo/Heme/Allergies: Positive for seasonal allergies.  Psychiatric/Behavioral: Negative for depression, suicidal ideas and hallucinations. Positive for anxiety All other systems reviewed and are negative.   Physical Exam: Vitals:   06/11/19 0904  BP: 110/80  Pulse: 72   Temp: 97.6 F (36.4 C)   Body mass index is 27.32 kg/m. Gen:      No acute distress HEENT:  EOMI, sclera anicteric Neck:     No masses; no thyromegaly Lungs:    Clear to auscultation bilaterally; normal respiratory effort CV:         Regular rate and rhythm; no murmurs Abd:      + bowel sounds; soft, non-tender; no palpable masses, no distension Ext:    No edema; adequate peripheral perfusion Skin:      Warm and dry; no rash Neuro: alert and oriented x 3 Psych: normal mood and affect  Data Reviewed:  Reviewed labs, radiology imaging, old records and pertinent past GI work up as per hpi   Assessment and Plan/Recommendations:  34 year old male with complaints of severe heartburn, dysphagia, regurgitation, nausea and significant weight loss  We will schedule for EGD to exclude severe erosive esophagitis, esophageal ulcer or eosinophilic esophagitis.  Will plan to  obtain esophageal biopsies and perform esophageal dilation if needed.  The risks and benefits as well as alternatives of endoscopic procedure(s) have been discussed and reviewed. All questions answered. The patient agrees to proceed.  Increase omeprazole to 40 mg twice daily, 30 minutes before breakfast and dinner Antireflux measures and lifestyle modification  He has chronically low alkaline phosphatase, will check to exclude any underlying liver disease especially autoimmune disorder or Wilson's disease Check ANA, AMA, anti-smooth muscle antibody, ceruloplasmin level and also alpha 1 antitrypsin level He had spontaneous pneumothorax last year.  No family history of cystic fibrosis.  Reviewed abdominal right upper quadrant ultrasound showed normal parenchyma of the liver and normal gallbladder.  Return in 2 to 3 months or sooner if needed   The patient was provided an opportunity to ask questions and all were answered. The patient agreed with the plan and demonstrated an understanding of the instructions.  Damaris Hippo , MD    CC: No ref. provider found

## 2019-06-12 ENCOUNTER — Ambulatory Visit (INDEPENDENT_AMBULATORY_CARE_PROVIDER_SITE_OTHER): Payer: Self-pay

## 2019-06-12 DIAGNOSIS — Z1159 Encounter for screening for other viral diseases: Secondary | ICD-10-CM

## 2019-06-13 LAB — ANA: Anti Nuclear Antibody (ANA): POSITIVE — AB

## 2019-06-13 LAB — ANTI-NUCLEAR AB-TITER (ANA TITER): ANA Titer 1: 1:40 {titer} — ABNORMAL HIGH

## 2019-06-15 LAB — ALPHA-1-ANTITRYPSIN: A-1 Antitrypsin, Ser: 135 mg/dL (ref 83–199)

## 2019-06-15 LAB — TISSUE TRANSGLUTAMINASE ABS,IGG,IGA
(tTG) Ab, IgA: 1 U/mL
(tTG) Ab, IgG: 2 U/mL

## 2019-06-15 LAB — ANTI-SMOOTH MUSCLE ANTIBODY, IGG: Actin (Smooth Muscle) Antibody (IGG): <20 U (ref <20)

## 2019-06-15 LAB — MITOCHONDRIAL ANTIBODIES: Mitochondrial M2 Ab, IgG: <=20 U

## 2019-06-15 LAB — HEPATITIS C ANTIBODY
Hepatitis C Ab: NONREACTIVE
SIGNAL TO CUT-OFF: 0.08 (ref <1.00)

## 2019-06-15 LAB — HEPATITIS A ANTIBODY, TOTAL: Hepatitis A AB,Total: NONREACTIVE

## 2019-06-15 LAB — HEPATITIS B SURFACE ANTIBODY,QUALITATIVE: Hep B S Ab: NONREACTIVE

## 2019-06-15 LAB — CERULOPLASMIN: Ceruloplasmin: 33 mg/dL (ref 18–36)

## 2019-06-15 LAB — HEPATITIS B SURFACE ANTIGEN: Hepatitis B Surface Ag: NONREACTIVE

## 2019-06-16 ENCOUNTER — Ambulatory Visit (AMBULATORY_SURGERY_CENTER): Payer: Self-pay | Admitting: Gastroenterology

## 2019-06-16 ENCOUNTER — Other Ambulatory Visit: Payer: Self-pay

## 2019-06-16 ENCOUNTER — Encounter: Payer: Self-pay | Admitting: Gastroenterology

## 2019-06-16 VITALS — BP 108/70 | HR 75 | Temp 97.8°F | Resp 12 | Ht 69.0 in | Wt 185.0 lb

## 2019-06-16 DIAGNOSIS — K2980 Duodenitis without bleeding: Secondary | ICD-10-CM

## 2019-06-16 DIAGNOSIS — K209 Esophagitis, unspecified without bleeding: Secondary | ICD-10-CM

## 2019-06-16 DIAGNOSIS — K2100 Gastro-esophageal reflux disease with esophagitis, without bleeding: Secondary | ICD-10-CM

## 2019-06-16 DIAGNOSIS — K3189 Other diseases of stomach and duodenum: Secondary | ICD-10-CM

## 2019-06-16 DIAGNOSIS — K449 Diaphragmatic hernia without obstruction or gangrene: Secondary | ICD-10-CM

## 2019-06-16 DIAGNOSIS — R1310 Dysphagia, unspecified: Secondary | ICD-10-CM

## 2019-06-16 MED ORDER — SODIUM CHLORIDE 0.9 % IV SOLN: 500.0000 mL | Freq: Once | INTRAVENOUS | Status: DC

## 2019-06-16 NOTE — Patient Instructions (Signed)
Impression/Recommendations:  Esophagitis handout given to patient. Hiatal hernia handout given to patient.  Resume previous diet. Continue present medications. Await pathology results.  Return to my office at next available appointment.  YOU HAD AN ENDOSCOPIC PROCEDURE TODAY AT THE  ENDOSCOPY CENTER:   Refer to the procedure report that was given to you for any specific questions about what was found during the examination.  If the procedure report does not answer your questions, please call your gastroenterologist to clarify.  If you requested that your care partner not be given the details of your procedure findings, then the procedure report has been included in a sealed envelope for you to review at your convenience later.  YOU SHOULD EXPECT: Some feelings of bloating in the abdomen. Passage of more gas than usual.  Walking can help get rid of the air that was put into your GI tract during the procedure and reduce the bloating. If you had a lower endoscopy (such as a colonoscopy or flexible sigmoidoscopy) you may notice spotting of blood in your stool or on the toilet paper. If you underwent a bowel prep for your procedure, you may not have a normal bowel movement for a few days.  Please Note:  You might notice some irritation and congestion in your nose or some drainage.  This is from the oxygen used during your procedure.  There is no need for concern and it should clear up in a day or so.  SYMPTOMS TO REPORT IMMEDIATELY:   Following upper endoscopy (EGD)  Vomiting of blood or coffee ground material  New chest pain or pain under the shoulder blades  Painful or persistently difficult swallowing  New shortness of breath  Fever of 100F or higher  Black, tarry-looking stools  For urgent or emergent issues, a gastroenterologist can be reached at any hour by calling [phone].   DIET:  We do recommend a small meal at first, but then you may proceed to your regular diet.   Drink plenty of fluids but you should avoid alcoholic beverages for 24 hours.  ACTIVITY:  You should plan to take it easy for the rest of today and you should NOT DRIVE or use heavy machinery until tomorrow (because of the sedation medicines used during the test).    FOLLOW UP: Our staff will call the number listed on your records 48-72 hours following your procedure to check on you and address any questions or concerns that you may have regarding the information given to you following your procedure. If we do not reach you, we will leave a message.  We will attempt to reach you two times.  During this call, we will ask if you have developed any symptoms of COVID 19. If you develop any symptoms (ie: fever, flu-like symptoms, shortness of breath, cough etc.) before then, please call [phone].  If you test positive for Covid 19 in the 2 weeks post procedure, please call and report this information to [place].    If any biopsies were taken you will be contacted by phone or by letter within the next 1-3 weeks.  Please call us at [phone] if you have not heard about the biopsies in 3 weeks.    SIGNATURES/CONFIDENTIALITY: You and/or your care partner have signed paperwork which will be entered into your electronic medical record.  These signatures attest to the fact that that the information above on your After Visit Summary has been reviewed and is understood.  Full responsibility of the confidentiality  of this discharge information lies with you and/or your care-partner.

## 2019-06-16 NOTE — Progress Notes (Signed)
Temp-LC VS-CW  Pt's states no medical or surgical changes since previsit or office visit.  

## 2019-06-16 NOTE — Op Note (Signed)
[facility] Patient Name: Timothy Horn Procedure Date: 06/16/2019 3:56 PM MRN: [id number] Endoscopist: Napoleon Form , MD Age: 34 Referring MD:  Date of Birth: Sep 10, 1985 Gender: Male Account #: [id number] Procedure:                Upper GI endoscopy Indications:              Dysphagia Medicines:                Monitored Anesthesia Care Procedure:                Pre-Anesthesia Assessment:                           - Prior to the procedure, a History and Physical                            was performed, and patient medications and                            allergies were reviewed. The patient's tolerance of                            previous anesthesia was also reviewed. The risks                            and benefits of the procedure and the sedation                            options and risks were discussed with the patient.                            All questions were answered, and informed consent                            was obtained. Prior Anticoagulants: The patient has                            taken no previous anticoagulant or antiplatelet                            agents. ASA Grade Assessment: II - A patient with                            mild systemic disease. After reviewing the risks                            and benefits, the patient was deemed in                            satisfactory condition to undergo the procedure.                           After obtaining informed consent, the endoscope was  passed under direct vision. Throughout the                            procedure, the patient's blood pressure, pulse, and                            oxygen saturations were monitored continuously. The                            Endoscope was introduced through the mouth, and                            advanced to the second part of duodenum. The upper                            GI endoscopy was accomplished without  difficulty.                            The patient tolerated the procedure well. Scope In: Scope Out: Findings:                 LA Grade A (one or more mucosal breaks less than 5                            mm, not extending between tops of 2 mucosal folds)                            esophagitis with no bleeding was found 37 to 38 cm                            from the incisors. Biopsies were taken with a cold                            forceps for histology. Biopsies were obtained from                            the proximal and distal esophagus with cold forceps                            for histology of suspected eosinophilic esophagitis.                           A small hiatal hernia was present.                           The stomach was normal.                           The cardia and gastric fundus were normal on                            retroflexion.  Patchy mildly erythematous mucosa without active                            bleeding and with no stigmata of bleeding was found                            in the first portion of the duodenum and in the                            second portion of the duodenum. Biopsies for                            histology were taken with a cold forceps for                            evaluation of celiac disease. Complications:            No immediate complications. Estimated Blood Loss:     Estimated blood loss was minimal. Impression:               - LA Grade A non-erosive esophagitis with no                            bleeding. Biopsied.                           - Small hiatal hernia.                           - Normal stomach.                           - Erythematous duodenopathy. Biopsied. Recommendation:           - Patient has a contact number available for                            emergencies. The signs and symptoms of potential                            delayed complications were discussed with the                             patient. Return to normal activities tomorrow.                            Written discharge instructions were provided to the                            patient.                           - Resume previous diet.                           - Continue present medications.                           -  Await pathology results.                           - Return to my office at the next available                            appointment. Napoleon Form, MD 06/16/2019 4:18:34 PM This report has been signed electronically.

## 2019-06-16 NOTE — Progress Notes (Signed)
Called to room to assist during endoscopic procedure.  Patient ID and intended procedure confirmed with present staff. Received instructions for my participation in the procedure from the performing physician.  

## 2019-06-18 ENCOUNTER — Telehealth: Payer: Self-pay

## 2019-06-18 ENCOUNTER — Telehealth: Payer: Self-pay | Admitting: *Deleted

## 2019-06-18 NOTE — Telephone Encounter (Signed)
EGD done 06/16/19. Recommendations: return to office at the next available appointment. Called the patient. No answer. Left a message on the voicemail about the purpose of my call. Requested he call me back to schedule an appointment as per Dr Lavon Paganini.

## 2019-06-18 NOTE — Telephone Encounter (Signed)
1st follow up call made.  NALM 

## 2019-06-18 NOTE — Telephone Encounter (Signed)
Message left

## 2019-06-21 ENCOUNTER — Telehealth: Payer: Self-pay | Admitting: Gastroenterology

## 2019-06-21 NOTE — Telephone Encounter (Signed)
[facility] MD: [place]  After hours call re: Covid concerns  Patient had EGD with biopsies with Dr. Lavon Paganini 06/16/19. Diagnosed with Covid 06/19/19. Concerned because he has had a cough that now has some streaks of blood. Worried that it could be related to his endoscopy. No nausea, vomiting, abdominal pain. Has severe fatigue and anorexia.  His reflux and dysphagia are unchanged following the procedure.    I offered reassurances that I did not think his symptoms were a result of the endoscopy. He does not have a PCP. I asked him to go to an Urgent Care or request a [facility] Tele[facility] to have his symptom appropriately evaluated. He agreed to seek care tonight and was grateful for the assistance.

## 2019-06-22 ENCOUNTER — Emergency Department (HOSPITAL_COMMUNITY): Payer: Medicaid Other

## 2019-06-22 ENCOUNTER — Emergency Department (HOSPITAL_COMMUNITY)
Admission: EM | Admit: 2019-06-22 | Discharge: 2019-06-22 | Disposition: A | Discharge status: Home / Self Care | Payer: Medicaid Other | Attending: Emergency Medicine | Admitting: Emergency Medicine

## 2019-06-22 ENCOUNTER — Other Ambulatory Visit: Payer: Self-pay

## 2019-06-22 ENCOUNTER — Encounter (HOSPITAL_COMMUNITY): Payer: Self-pay | Admitting: Emergency Medicine

## 2019-06-22 DIAGNOSIS — R197 Diarrhea, unspecified: Secondary | ICD-10-CM | POA: Insufficient documentation

## 2019-06-22 DIAGNOSIS — R1084 Generalized abdominal pain: Secondary | ICD-10-CM | POA: Insufficient documentation

## 2019-06-22 DIAGNOSIS — Z79899 Other long term (current) drug therapy: Secondary | ICD-10-CM | POA: Insufficient documentation

## 2019-06-22 DIAGNOSIS — U071 COVID-19: Secondary | ICD-10-CM | POA: Insufficient documentation

## 2019-06-22 DIAGNOSIS — E860 Dehydration: Secondary | ICD-10-CM

## 2019-06-22 LAB — CBC
HCT: 45.2 % (ref 39.0–52.0)
Hemoglobin: 16 g/dL (ref 13.0–17.0)
MCH: 30.3 pg (ref 26.0–34.0)
MCHC: 35.4 g/dL (ref 30.0–36.0)
MCV: 85.6 fL (ref 80.0–100.0)
Platelets: 240 10*3/uL (ref 150–400)
RBC: 5.28 MIL/uL (ref 4.22–5.81)
RDW: 11.1 % — ABNORMAL LOW (ref 11.5–15.5)
WBC: 2.9 10*3/uL — ABNORMAL LOW (ref 4.0–10.5)
nRBC: 0 % (ref 0.0–0.2)

## 2019-06-22 LAB — BASIC METABOLIC PANEL
Anion gap: 13 (ref 5–15)
BUN: 5 mg/dL — ABNORMAL LOW (ref 6–20)
CO2: 22 mmol/L (ref 22–32)
Calcium: 9.3 mg/dL (ref 8.9–10.3)
Chloride: 102 mmol/L (ref 98–111)
Creatinine, Ser: 0.99 mg/dL (ref 0.61–1.24)
GFR calc Af Amer: >60 mL/min (ref >60)
GFR calc non Af Amer: >60 mL/min (ref >60)
Glucose, Bld: 95 mg/dL (ref 70–99)
Potassium: 4.1 mmol/L (ref 3.5–5.1)
Sodium: 137 mmol/L (ref 135–145)

## 2019-06-22 LAB — HEPATIC FUNCTION PANEL
ALT: 20 U/L (ref 0–44)
AST: 21 U/L (ref 15–41)
Albumin: 4.2 g/dL (ref 3.5–5.0)
Alkaline Phosphatase: 25 U/L — ABNORMAL LOW (ref 38–126)
Bilirubin, Direct: 0.1 mg/dL (ref 0.0–0.2)
Indirect Bilirubin: 0.7 mg/dL (ref 0.3–0.9)
Total Bilirubin: 0.8 mg/dL (ref 0.3–1.2)
Total Protein: 7.3 g/dL (ref 6.5–8.1)

## 2019-06-22 LAB — LIPASE, BLOOD: Lipase: 25 U/L (ref 11–51)

## 2019-06-22 MED ORDER — ONDANSETRON 4 MG PO TBDP: 4.0000 mg | ORAL_TABLET | Freq: Four times a day (QID) | ORAL | 0 refills | Status: DC | PRN

## 2019-06-22 MED ORDER — SODIUM CHLORIDE 0.9 % IV BOLUS
1000.0000 mL | Freq: Once | INTRAVENOUS | Status: AC
  Administered 2019-06-22: 20:00:00 1000 mL via INTRAVENOUS

## 2019-06-22 MED ORDER — PANTOPRAZOLE SODIUM 40 MG IV SOLR
40.0000 mg | Freq: Once | INTRAVENOUS | Status: AC
  Administered 2019-06-22: 20:00:00 40 mg via INTRAVENOUS
  Filled 2019-06-22: qty 40

## 2019-06-22 MED ORDER — ONDANSETRON HCL 4 MG/2ML IJ SOLN
4.0000 mg | Freq: Once | INTRAMUSCULAR | Status: AC
  Administered 2019-06-22: 20:00:00 4 mg via INTRAVENOUS
  Filled 2019-06-22: qty 2

## 2019-06-22 MED ORDER — SODIUM CHLORIDE 0.9 % IV BOLUS
1000.0000 mL | Freq: Once | INTRAVENOUS | Status: AC
  Administered 2019-06-22: 1000 mL via INTRAVENOUS

## 2019-06-22 NOTE — ED Notes (Signed)
Patient verbalizes understanding of discharge instructions. Opportunity for questioning and answers were provided. Armband removed by staff. Patient discharged from ED. Signature pad not working at this time.

## 2019-06-22 NOTE — Discharge Instructions (Signed)
Please take Tylenol (acetaminophen) to relieve your pain.  You may take tylenol, up to 1,000 mg (two extra strength pills).  Do not take more than 3,000 mg tylenol in a 24 hour period.  Please check all medication labels as many medications such as pain and cold medications may contain tylenol. Please do not drink alcohol while taking this medication.  ° °

## 2019-06-22 NOTE — ED Triage Notes (Signed)
Patient reports unable to eat or keep anything down over the past week. Tested positive for Covid on 28th. Had recent endoscopy and states he is having trouble staying hydrated and feeling weak. Attempted taking Pepcid, Prilosec and Mylanta for acid reflux.

## 2019-06-22 NOTE — ED Provider Notes (Signed)
[place] EMERGENCY DEPARTMENT Provider Note   CSN: [id number] Arrival date & time: 06/22/19  1707     History Chief Complaint  Patient presents with  . Weakness    Covid +    Timothy Horn is a 34 y.o. male with a past medical history of GERD, asthma, who presents today for evaluation of nausea vomiting and diarrhea.  He tested positive for COVID-19 on the 28th.  He had previously had an endoscopy as he has had weight loss with acid reflux and feelings of food getting stuck in his chest when he swallows over the past few months.  He reports that he has had multiple episodes of diarrhea and vomiting and is now starting to get lightheaded with position changes.  He has been taking Pepcid, Pepto-Bismol, Prilosec, and Mylanta all without significant relief.  He denies any significant shortness of breath except for when the feeling of food being stuck in his esophagus flares, and this is unchanged from his baseline.  HPI     Past Medical History:  Diagnosis Date  . Allergy    seasonal allergies per pt.  . Anxiety   . Asthma   . GERD (gastroesophageal reflux disease)     Patient Active Problem List   Diagnosis Date Noted  . Atelectasis   . Spontenous pneumothorax of right lung 06/16/2018    History reviewed. No pertinent surgical history.     Family History  Problem Relation Age of Onset  . Mental illness Sister   . Hyperlipidemia Maternal Grandmother   . Colon cancer Neg Hx   . Esophageal cancer Neg Hx   . Stomach cancer Neg Hx   . Rectal cancer Neg Hx     Social History   Tobacco Use  . Smoking status: Never Smoker  . Smokeless tobacco: Never Used  Substance Use Topics  . Alcohol use: Yes    Alcohol/week: 2.0 standard drinks    Types: 1 Glasses of wine, 1 Shots of liquor per week    Comment: not even 1 a week  . Drug use: No    Home Medications Prior to Admission medications   Medication Sig Start Date End Date Taking? Authorizing  Provider  albuterol (PROVENTIL HFA;VENTOLIN HFA) 108 (90 Base) MCG/ACT inhaler Inhale 2 puffs into the lungs every 4 (four) hours as needed for wheezing or shortness of breath. Patient not taking: Reported on 06/16/2019 07/02/18   Isabelle Course, MD  ALPRAZolam Duanne Moron) 0.5 MG tablet Take 1 tablet by mouth as needed.    [provider]  calcium carbonate (TUMS EX) 750 MG chewable tablet Chew by mouth as directed.    [provider]  famotidine (PEPCID) 20 MG tablet Take 1 tablet (20 mg total) by mouth 2 (two) times daily for 14 days. Patient not taking: Reported on 06/16/2019 06/10/19 06/24/19  Couture, Cortni S, PA-C  loratadine-pseudoephedrine (CLARITIN-D 24-HOUR) 10-240 MG 24 hr tablet Take 1 tablet by mouth daily as needed (seasonal allergies (springtime)).     [provider]  omeprazole (PRILOSEC) 40 MG capsule Take 1 capsule (40 mg total) by mouth 2 (two) times daily. 06/11/19   Mauri Pole, MD  ondansetron (ZOFRAN ODT) 4 MG disintegrating tablet Take 1 tablet (4 mg total) by mouth every 6 (six) hours as needed for nausea or vomiting. 06/22/19   Lorin Glass, PA-C  sucralfate (CARAFATE) 1 GM/10ML suspension Take 10 mLs (1 g total) by mouth 4 (four) times daily -  with meals and at bedtime. Patient not taking: Reported on 06/16/2019 06/10/19   Couture, Cortni S, PA-C    Allergies    Ativan [lorazepam] and Paxil [paroxetine hcl]  Review of Systems   Review of Systems  Constitutional: Negative for chills and fever.  Respiratory: Positive for cough and shortness of breath (Mostly when sensation of food bolus worsens.).   Cardiovascular: Negative for palpitations and leg swelling.  Gastrointestinal: Positive for abdominal pain (diffuse, general, consistent with baseline), diarrhea, nausea and vomiting. Negative for abdominal distention and blood in stool.  Musculoskeletal: Negative for neck pain and neck stiffness.  Skin: Negative for color change, rash and  wound.  Neurological: Positive for light-headedness.  All other systems reviewed and are negative.   Physical Exam Updated Vital Signs BP 120/79   Pulse 77   Temp 98.5 F (36.9 C) (Oral)   Resp 15   Ht 5\' 9"  (1.753 m)   Wt 83.9 kg   SpO2 99%   BMI 27.32 kg/m   Physical Exam Vitals and nursing note reviewed.  Constitutional:      General: He is not in acute distress.    Appearance: He is well-developed. He is not diaphoretic.  HENT:     Head: Normocephalic and atraumatic.  Eyes:     General: No scleral icterus.       Right eye: No discharge.        Left eye: No discharge.     Conjunctiva/sclera: Conjunctivae normal.  Cardiovascular:     Rate and Rhythm: Normal rate and regular rhythm.     Pulses: Normal pulses.     Heart sounds: Normal heart sounds.  Pulmonary:     Effort: Pulmonary effort is normal. No respiratory distress.     Breath sounds: No stridor.  Abdominal:     General: Bowel sounds are normal. There is no distension.     Tenderness: There is abdominal tenderness (diffuse). There is no guarding or rebound.  Musculoskeletal:        General: No deformity.     Cervical back: Normal range of motion and neck supple. No rigidity.  Skin:    General: Skin is warm and dry.  Neurological:     General: No focal deficit present.     Mental Status: He is alert and oriented to person, place, and time.     Motor: No abnormal muscle tone.  Psychiatric:        Mood and Affect: Mood normal.        Behavior: Behavior normal.     ED Results / Procedures / Treatments   Labs (all labs ordered are listed, but only abnormal results are displayed) Labs Reviewed  BASIC METABOLIC PANEL - Abnormal; Notable for the following components:      Result Value   BUN 5 (*)    All other components within normal limits  CBC - Abnormal; Notable for the following components:   WBC 2.9 (*)    RDW 11.1 (*)    All other components within normal limits  HEPATIC FUNCTION PANEL -  Abnormal; Notable for the following components:   Alkaline Phosphatase 25 (*)    All other components within normal limits  LIPASE, BLOOD    EKG EKG Interpretation  Date/Time:  Sunday June 22 2019 18:33:30 EST Ventricular Rate:  81 PR Interval:    QRS Duration: 78 QT Interval:  384 QTC Calculation: 446 R Axis:   75 Text Interpretation: Sinus rhythm No significant change since  last tracing Confirmed by Melene Plan [phone]) on 06/22/2019 9:29:06 PM   Radiology DG Chest Port 1 View  Result Date: 06/22/2019 CLINICAL DATA:  COVID pneumonia. EXAM: PORTABLE CHEST 1 VIEW COMPARISON:  06/10/2019 FINDINGS: The heart size and mediastinal contours are within normal limits. Both lungs are clear. The visualized skeletal structures are unremarkable. IMPRESSION: No active disease. Electronically Signed   By: Katherine Mantle M.D.   On: 06/22/2019 19:13    Procedures Procedures (including critical care time)  Medications Ordered in ED Medications  sodium chloride 0.9 % bolus 1,000 mL (0 mLs Intravenous Stopped 06/22/19 1947)  sodium chloride 0.9 % bolus 1,000 mL (0 mLs Intravenous Stopped 06/22/19 2231)  ondansetron (ZOFRAN) injection 4 mg (4 mg Intravenous Given 06/22/19 1949)  pantoprazole (PROTONIX) injection 40 mg (40 mg Intravenous Given 06/22/19 1951)    ED Course  I have reviewed the triage vital signs and the nursing notes.  Pertinent labs & imaging results that were available during my care of the patient were reviewed by me and considered in my medical decision making (see chart for details).  Clinical Course as of Jun 21 2336  Wynelle Link Jun 22, 2019  2100 Spoke with patient.  He reports his nausea is improved.  He is attempting p.o. challenge.  He states he does feel the need to urinate.   [EH]    Clinical Course User Index [EH] Norman Clay   MDM Rules/Calculators/A&P                      Patient presents today for evaluation of worsening nausea vomiting and  diarrhea.  He at baseline has difficulties with heartburn and nausea however this is significantly worsened since he developed coronavirus and tested positive on the 28th.  He reports feeling lightheaded.  On exam he is generally well-appearing.  He does have diffuse abdominal pain which he attributes to vomiting and says it is not significantly changed from his baseline.  Labs were obtained and reviewed without any significant hematologic or electrolyte derangements other than mild leukopenia as anticipated with Covid.  Chest x-ray without consolidation pneumothorax or other abnormalities.  EKG without evidence of acute ischemia.  Patient was rehydrated with 2 L IV saline after which he reported feeling better.  He was also treated with IV Protonix, and IV Zofran after which he was able to pass p.o. challenge and reported his nausea was significantly improved. He is given a prescription for Zofran at home and registered for the MyChart COVID-19 home monitoring program.  He did not have evidence of hypoxia, even with ambulation.   Daray Polgar was evaluated in Emergency Department on 06/22/2019 for the symptoms described in the history of present illness. He was evaluated in the context of the global COVID-19 pandemic, which necessitated consideration that the patient might be at risk for infection with the SARS-CoV-2 virus that causes COVID-19. Institutional protocols and algorithms that pertain to the evaluation of patients at risk for COVID-19 are in a state of rapid change based on information released by regulatory bodies including the CDC and federal and state organizations. These policies and algorithms were followed during the patient's care in the ED.  Return precautions were discussed with patient who states their understanding.  At the time of discharge patient denied any unaddressed complaints or concerns.  Patient is agreeable for discharge home.  Note: Portions of this report may have  been transcribed using voice recognition software. Every effort was made  to ensure accuracy; however, inadvertent computerized transcription errors may be present  Final Clinical Impression(s) / ED Diagnoses Final diagnoses:  COVID-19  Dehydration    Rx / DC Orders ED Discharge Orders         Ordered    MyChart COVID-19 home monitoring program     06/22/19 2157    ondansetron (ZOFRAN ODT) 4 MG disintegrating tablet  Every 6 hours PRN     06/22/19 2158           Norman Clay 06/22/19 2342    Bethann Berkshire, MD 06/23/19 [phone]

## 2019-06-23 ENCOUNTER — Telehealth: Payer: Self-pay | Admitting: Gastroenterology

## 2019-06-23 NOTE — Telephone Encounter (Signed)
Thanks Milton Ferguson FYI, if we are tracking cases with positive Covid test Thanks VN

## 2019-06-23 NOTE — Telephone Encounter (Signed)
Pt states that he has been taking prevacid but it is not helping. He wants to know if he can take anything else.

## 2019-06-23 NOTE — Telephone Encounter (Signed)
Called patient. No answer. Left message that we are calling to help him schedule a follow up appointment.

## 2019-06-23 NOTE — Telephone Encounter (Signed)
Pt returned your call.  

## 2019-06-23 NOTE — Telephone Encounter (Signed)
Left a message to call back. He needs a follow up appointment.

## 2019-06-24 NOTE — Telephone Encounter (Signed)
Patient aware of the appointment.

## 2019-06-24 NOTE — Telephone Encounter (Signed)
Called patient. Voicemail again. Please tell the patient he is scheduled for 06/30/19 to come back in for an office visit with Dr Lavon Paganini.

## 2019-06-25 ENCOUNTER — Encounter (HOSPITAL_COMMUNITY): Payer: Self-pay | Admitting: Emergency Medicine

## 2019-06-25 ENCOUNTER — Emergency Department (HOSPITAL_COMMUNITY): Payer: Self-pay

## 2019-06-25 ENCOUNTER — Other Ambulatory Visit: Payer: Self-pay

## 2019-06-25 ENCOUNTER — Encounter: Payer: Self-pay | Admitting: Gastroenterology

## 2019-06-25 ENCOUNTER — Emergency Department (HOSPITAL_COMMUNITY)
Admission: EM | Admit: 2019-06-25 | Discharge: 2019-06-26 | Disposition: A | Discharge status: Home / Self Care | Payer: Self-pay | Attending: Emergency Medicine | Admitting: Emergency Medicine

## 2019-06-25 ENCOUNTER — Emergency Department (HOSPITAL_COMMUNITY)
Admission: EM | Admit: 2019-06-25 | Discharge: 2019-06-25 | Disposition: A | Discharge status: Home / Self Care | Payer: Medicaid Other | Attending: Emergency Medicine | Admitting: Emergency Medicine

## 2019-06-25 ENCOUNTER — Encounter (HOSPITAL_COMMUNITY): Payer: Self-pay | Admitting: Family Medicine

## 2019-06-25 DIAGNOSIS — R110 Nausea: Secondary | ICD-10-CM

## 2019-06-25 DIAGNOSIS — R42 Dizziness and giddiness: Secondary | ICD-10-CM | POA: Insufficient documentation

## 2019-06-25 DIAGNOSIS — Z79899 Other long term (current) drug therapy: Secondary | ICD-10-CM | POA: Insufficient documentation

## 2019-06-25 DIAGNOSIS — U071 COVID-19: Secondary | ICD-10-CM | POA: Insufficient documentation

## 2019-06-25 DIAGNOSIS — R112 Nausea with vomiting, unspecified: Secondary | ICD-10-CM

## 2019-06-25 DIAGNOSIS — K219 Gastro-esophageal reflux disease without esophagitis: Secondary | ICD-10-CM | POA: Insufficient documentation

## 2019-06-25 DIAGNOSIS — R0789 Other chest pain: Secondary | ICD-10-CM

## 2019-06-25 DIAGNOSIS — J45909 Unspecified asthma, uncomplicated: Secondary | ICD-10-CM | POA: Insufficient documentation

## 2019-06-25 DIAGNOSIS — F419 Anxiety disorder, unspecified: Secondary | ICD-10-CM | POA: Insufficient documentation

## 2019-06-25 DIAGNOSIS — R12 Heartburn: Secondary | ICD-10-CM

## 2019-06-25 HISTORY — DX: COVID-19: U07.1

## 2019-06-25 LAB — COMPREHENSIVE METABOLIC PANEL
ALT: 19 U/L (ref 0–44)
AST: 22 U/L (ref 15–41)
Albumin: 4.9 g/dL (ref 3.5–5.0)
Alkaline Phosphatase: 28 U/L — ABNORMAL LOW (ref 38–126)
Anion gap: 14 (ref 5–15)
BUN: <5 mg/dL — ABNORMAL LOW (ref 6–20)
CO2: 24 mmol/L (ref 22–32)
Calcium: 9.7 mg/dL (ref 8.9–10.3)
Chloride: 101 mmol/L (ref 98–111)
Creatinine, Ser: 1.05 mg/dL (ref 0.61–1.24)
GFR calc Af Amer: >60 mL/min (ref >60)
GFR calc non Af Amer: >60 mL/min (ref >60)
Glucose, Bld: 104 mg/dL — ABNORMAL HIGH (ref 70–99)
Potassium: 3.8 mmol/L (ref 3.5–5.1)
Sodium: 139 mmol/L (ref 135–145)
Total Bilirubin: 1.2 mg/dL (ref 0.3–1.2)
Total Protein: 8.3 g/dL — ABNORMAL HIGH (ref 6.5–8.1)

## 2019-06-25 LAB — CBC
HCT: 45.3 % (ref 39.0–52.0)
Hemoglobin: 15.4 g/dL (ref 13.0–17.0)
MCH: 29.9 pg (ref 26.0–34.0)
MCHC: 34 g/dL (ref 30.0–36.0)
MCV: 88 fL (ref 80.0–100.0)
Platelets: 191 10*3/uL (ref 150–400)
RBC: 5.15 MIL/uL (ref 4.22–5.81)
RDW: 11.5 % (ref 11.5–15.5)
WBC: 6.8 10*3/uL (ref 4.0–10.5)
nRBC: 0 % (ref 0.0–0.2)

## 2019-06-25 LAB — LIPASE, BLOOD: Lipase: 31 U/L (ref 11–51)

## 2019-06-25 MED ORDER — DIPHENHYDRAMINE HCL 50 MG/ML IJ SOLN
25.0000 mg | Freq: Once | INTRAMUSCULAR | Status: AC
  Administered 2019-06-25: 09:00:00 25 mg via INTRAVENOUS
  Filled 2019-06-25: qty 1

## 2019-06-25 MED ORDER — ALUM & MAG HYDROXIDE-SIMETH 200-200-20 MG/5ML PO SUSP
30.0000 mL | Freq: Once | ORAL | Status: AC
  Administered 2019-06-25: 10:00:00 30 mL via ORAL
  Filled 2019-06-25: qty 30

## 2019-06-25 MED ORDER — PANTOPRAZOLE SODIUM 40 MG IV SOLR
40.0000 mg | Freq: Once | INTRAVENOUS | Status: AC
  Administered 2019-06-25: 08:00:00 40 mg via INTRAVENOUS
  Filled 2019-06-25: qty 40

## 2019-06-25 MED ORDER — SODIUM CHLORIDE 0.9% FLUSH
3.0000 mL | Freq: Once | INTRAVENOUS | Status: AC
  Administered 2019-06-25: 3 mL via INTRAVENOUS

## 2019-06-25 MED ORDER — LIDOCAINE VISCOUS HCL 2 % MT SOLN
15.0000 mL | Freq: Once | OROMUCOSAL | Status: AC
  Administered 2019-06-25: 10:00:00 15 mL via ORAL
  Filled 2019-06-25: qty 15

## 2019-06-25 MED ORDER — HYDROXYZINE HCL 25 MG PO TABS: 25.0000 mg | ORAL_TABLET | Freq: Four times a day (QID) | ORAL | 0 refills | Status: DC | PRN

## 2019-06-25 MED ORDER — SODIUM CHLORIDE 0.9 % IV BOLUS
1000.0000 mL | Freq: Once | INTRAVENOUS | Status: AC
  Administered 2019-06-25: 1000 mL via INTRAVENOUS

## 2019-06-25 MED ORDER — SODIUM CHLORIDE 0.9% FLUSH: 3.0000 mL | Freq: Once | INTRAVENOUS | Status: DC

## 2019-06-25 MED ORDER — PROMETHAZINE HCL 25 MG/ML IJ SOLN
12.5000 mg | Freq: Once | INTRAMUSCULAR | Status: AC
  Administered 2019-06-25: 12.5 mg via INTRAVENOUS
  Filled 2019-06-25: qty 1

## 2019-06-25 MED ORDER — PROMETHAZINE HCL 25 MG RE SUPP: 25.0000 mg | Freq: Four times a day (QID) | RECTAL | 0 refills | Status: DC | PRN

## 2019-06-25 NOTE — ED Triage Notes (Signed)
Patient tested positive for COVID19 06/18/18 presents with multiple complaints : persistent emesis with gastric reflux , fatigue , insomnia , anxiety dizziness and lightheaded .

## 2019-06-25 NOTE — ED Provider Notes (Signed)
[facility] EMERGENCY DEPARTMENT Provider Note   CSN: [id number] Arrival date & time: 06/25/19  0102     History Chief Complaint  Patient presents with  . COVID+ / emesis/fatigue    Timothy Horn is a 34 y.o. male male with a pmh of chronic GERD, followed at [facility] by Dr. Silverio Decamp. The patient was dx with COVID on 06/19/2019 and began having sxs of cough, fever, chills, body aches on the 27th.  Patient states he has had severe reflux, nausea and vomiting for the past few days.  He has been unable to sleep.  He is try to use the Zofran he was prescribed but has vomited it up.  He also has been given Pepcid and Prevacid without relief of his reflux symptoms.  Patient states that he is feeling dehydrated, lightheaded and unable to get any relief of his reflux symptoms.  He denies any shortness of breath.  He states that the majority of his coronavirus related symptoms have resolved.  He is not running fevers anymore.  He does have poor appetite.  Patient states that he try to get in touch with Dr.Nandigam and has a follow-up appointment on 06/30/2019.  HPI     Past Medical History:  Diagnosis Date  . Allergy    seasonal allergies per pt.  . Anxiety   . Asthma   . COVID-19   . GERD (gastroesophageal reflux disease)     Patient Active Problem List   Diagnosis Date Noted  . Atelectasis   . Spontenous pneumothorax of right lung 06/16/2018    History reviewed. No pertinent surgical history.     Family History  Problem Relation Age of Onset  . Mental illness Sister   . Hyperlipidemia Maternal Grandmother   . Colon cancer Neg Hx   . Esophageal cancer Neg Hx   . Stomach cancer Neg Hx   . Rectal cancer Neg Hx     Social History   Tobacco Use  . Smoking status: Never Smoker  . Smokeless tobacco: Never Used  Substance Use Topics  . Alcohol use: Yes    Alcohol/week: 2.0 standard drinks    Types: 1 Glasses of wine, 1 Shots of liquor per week   Comment: not even 1 a week  . Drug use: No    Home Medications Prior to Admission medications   Medication Sig Start Date End Date Taking? Authorizing Provider  albuterol (PROVENTIL HFA;VENTOLIN HFA) 108 (90 Base) MCG/ACT inhaler Inhale 2 puffs into the lungs every 4 (four) hours as needed for wheezing or shortness of breath. Patient not taking: Reported on 06/16/2019 07/02/18   Isabelle Course, MD  calcium carbonate (TUMS EX) 750 MG chewable tablet Chew by mouth as directed.    [provider]  famotidine (PEPCID) 20 MG tablet Take 1 tablet (20 mg total) by mouth 2 (two) times daily for 14 days. Patient not taking: Reported on 06/16/2019 06/10/19 06/24/19  Couture, Cortni S, PA-C  hydrOXYzine (ATARAX/VISTARIL) 25 MG tablet Take 1 tablet (25 mg total) by mouth every 6 (six) hours as needed for anxiety. 06/25/19   Tekia Waterbury, PA-C  loratadine-pseudoephedrine (CLARITIN-D 24-HOUR) 10-240 MG 24 hr tablet Take 1 tablet by mouth daily as needed (seasonal allergies (springtime)).     [provider]  omeprazole (PRILOSEC) 40 MG capsule Take 1 capsule (40 mg total) by mouth 2 (two) times daily. 06/11/19   Mauri Pole, MD  ondansetron (ZOFRAN ODT) 4 MG disintegrating tablet Take 1  tablet (4 mg total) by mouth every 6 (six) hours as needed for nausea or vomiting. 06/22/19   Cristina Gong, PA-C  promethazine (PHENERGAN) 25 MG suppository Place 1 suppository (25 mg total) rectally every 6 (six) hours as needed for nausea or vomiting. 06/25/19   An Schnabel, Cammy Copa, PA-C  sucralfate (CARAFATE) 1 GM/10ML suspension Take 10 mLs (1 g total) by mouth 4 (four) times daily -  with meals and at bedtime. Patient not taking: Reported on 06/16/2019 06/10/19   Couture, Cortni S, PA-C    Allergies    Ativan [lorazepam] and Paxil [paroxetine hcl]  Review of Systems   Review of Systems Ten systems reviewed and are negative for acute change, except as noted in the HPI.   Physical Exam Updated  Vital Signs BP 135/88 (BP Location: Left Arm)   Pulse 98   Temp 98.3 F (36.8 C) (Oral)   Resp 16   SpO2 100%   Physical Exam Vitals and nursing note reviewed.  Constitutional:      General: He is not in acute distress.    Appearance: He is well-developed. He is not diaphoretic.  HENT:     Head: Normocephalic and atraumatic.     Mouth/Throat:     Mouth: Mucous membranes are dry.  Eyes:     General: No scleral icterus.    Extraocular Movements: Extraocular movements intact.     Conjunctiva/sclera: Conjunctivae normal.     Pupils: Pupils are equal, round, and reactive to light.  Cardiovascular:     Rate and Rhythm: Normal rate and regular rhythm.     Heart sounds: Normal heart sounds.  Pulmonary:     Effort: Pulmonary effort is normal. No respiratory distress.     Breath sounds: Normal breath sounds.  Abdominal:     Palpations: Abdomen is soft.     Tenderness: There is no abdominal tenderness.  Musculoskeletal:     Cervical back: Normal range of motion and neck supple.  Skin:    General: Skin is warm and dry.  Neurological:     Mental Status: He is alert.  Psychiatric:        Behavior: Behavior normal.     ED Results / Procedures / Treatments   Labs (all labs ordered are listed, but only abnormal results are displayed) Labs Reviewed  COMPREHENSIVE METABOLIC PANEL - Abnormal; Notable for the following components:      Result Value   Glucose, Bld 104 (*)    BUN <5 (*)    Total Protein 8.3 (*)    Alkaline Phosphatase 28 (*)    All other components within normal limits  LIPASE, BLOOD  CBC  URINALYSIS, ROUTINE W REFLEX MICROSCOPIC    EKG None  Radiology No results found.  Procedures Procedures (including critical care time)  Medications Ordered in ED Medications  sodium chloride flush (NS) 0.9 % injection 3 mL (3 mLs Intravenous Given 06/25/19 0755)  sodium chloride 0.9 % bolus 1,000 mL (0 mLs Intravenous Stopped 06/25/19 0924)  pantoprazole (PROTONIX)  injection 40 mg (40 mg Intravenous Given 06/25/19 0755)  promethazine (PHENERGAN) injection 12.5 mg (12.5 mg Intravenous Given 06/25/19 0757)  diphenhydrAMINE (BENADRYL) injection 25 mg (25 mg Intravenous Given 06/25/19 0923)  alum & mag hydroxide-simeth (MAALOX/MYLANTA) 200-200-20 MG/5ML suspension 30 mL (30 mLs Oral Given 06/25/19 0941)    And  lidocaine (XYLOCAINE) 2 % viscous mouth solution 15 mL (15 mLs Oral Given 06/25/19 0941)    ED Course  I have reviewed the triage  vital signs and the nursing notes.  Pertinent labs & imaging results that were available during my care of the patient were reviewed by me and considered in my medical decision making (see chart for details).    MDM Rules/Calculators/A&P                      34 year old male here with nausea, vomiting, recent Covid virus infection, reflux.  I reviewed the patient's labs which show normal lipase.  CMP without significant abnormality.  CBC shows no elevated white blood cell count or other abnormality.  Patient was given IV Protonix, Phenergan, Benadryl, and GI cocktail.  His reflux symptoms have vastly improved although he still has some mild to moderate nausea he is tolerating p.o. fluids without any episodes of vomiting here.  The patient also is complaining of feeling extremely anxious.  Discussed outpatient resources with the patient.  He is not feeling suicidal or homicidal.  He will be discharged with some Vistaril.  Patient otherwise appears appropriate for discharge at this time.  Patient has family-planning Medicaid and I have advised him to follow-up with his caseworker and/or use the 800-number at the back of the discharge paperwork to establish primary care physician outpatient treatment.  Discussed return precaution.  Appears appropriate for discharge Final Clinical Impression(s) / ED Diagnoses Final diagnoses:  Nausea  Anxiety  Heartburn    Rx / DC Orders ED Discharge Orders         Ordered    hydrOXYzine  (ATARAX/VISTARIL) 25 MG tablet  Every 6 hours PRN     06/25/19 1037    promethazine (PHENERGAN) 25 MG suppository  Every 6 hours PRN     06/25/19 1037           Arthor Captain, PA-C 06/25/19 1039    Gerhard Munch, MD 06/25/19 1627

## 2019-06-25 NOTE — ED Triage Notes (Signed)
Patient was diagnosed with COVID on 06/19/2019. He is complaining of mid-sternal chest pain and shortness of breath. He has already been seen at [facility] today. Dx with Nausea, anxiety, heartburn. Prescribed Hydroxyzine and Promethazine but has not took medications.

## 2019-06-25 NOTE — Discharge Instructions (Signed)
Get help right away if: You have pain in your chest, neck, arm, or jaw. You feel extremely weak or you faint. You have vomit that is bright red or looks like coffee grounds. You have bloody or black stools or stools that look like tar. You have a severe headache, a stiff neck, or both. You have severe pain, cramping, or bloating in your abdomen. You have difficulty breathing or are breathing very quickly. Your heart is beating very quickly. Your skin feels cold and clammy. You feel confused. You have signs of dehydration, such as: Dark urine, very little urine, or no urine. Cracked lips. Dry mouth. Sunken eyes. Sleepiness. Weakness. 

## 2019-06-26 MED ORDER — LIDOCAINE VISCOUS HCL 2 % MT SOLN: 15.0000 mL | Freq: Four times a day (QID) | OROMUCOSAL | 0 refills | Status: DC | PRN

## 2019-06-26 MED ORDER — LIDOCAINE VISCOUS HCL 2 % MT SOLN
15.0000 mL | Freq: Once | OROMUCOSAL | Status: AC
  Administered 2019-06-26: 15 mL via ORAL
  Filled 2019-06-26: qty 15

## 2019-06-26 MED ORDER — ALUM & MAG HYDROXIDE-SIMETH 200-200-20 MG/5ML PO SUSP
30.0000 mL | Freq: Once | ORAL | Status: AC
  Administered 2019-06-26: 01:00:00 30 mL via ORAL
  Filled 2019-06-26: qty 30

## 2019-06-26 NOTE — ED Provider Notes (Signed)
[facility] Note  CSN: [id number] Arrival date & time: 06/25/19 2006  Chief Complaint(s) Chest Pain, Shortness of Breath, and COVID Positive  HPI Timothy Horn is a 34 y.o. male    Chest Pain Pain location:  Epigastric and L lateral chest Pain quality: burning and stabbing   Pain radiates to:  Does not radiate Pain severity:  Moderate Onset quality:  Gradual Timing:  Intermittent Chronicity:  Recurrent Relieved by:  Nothing Exacerbated by: lying down. Associated symptoms: anxiety, cough, nausea, shortness of breath and vomiting   Risk factors comment:  COVID+ Shortness of Breath Associated symptoms: chest pain, cough and vomiting     Past Medical History Past Medical History:  Diagnosis Date  . Allergy    seasonal allergies per pt.  . Anxiety   . Asthma   . COVID-19   . GERD (gastroesophageal reflux disease)    Patient Active Problem List   Diagnosis Date Noted  . Atelectasis   . Spontenous pneumothorax of right lung 06/16/2018   Home Medication(s) Prior to Admission medications   Medication Sig Start Date End Date Taking? Authorizing Provider  albuterol (PROVENTIL HFA;VENTOLIN HFA) 108 (90 Base) MCG/ACT inhaler Inhale 2 puffs into the lungs every 4 (four) hours as needed for wheezing or shortness of breath. Patient not taking: Reported on 06/16/2019 07/02/18   Ali Lowe, MD  calcium carbonate (TUMS EX) 750 MG chewable tablet Chew by mouth as directed.    [provider]  famotidine (PEPCID) 20 MG tablet Take 1 tablet (20 mg total) by mouth 2 (two) times daily for 14 days. Patient not taking: Reported on 06/16/2019 06/10/19 06/24/19  Couture, Cortni S, PA-C  hydrOXYzine (ATARAX/VISTARIL) 25 MG tablet Take 1 tablet (25 mg total) by mouth every 6 (six) hours as needed for anxiety. 06/25/19   Harris, Cammy Copa, PA-C  lidocaine (XYLOCAINE) 2 % solution Use as directed 15 mLs in the mouth or throat every 6 (six) hours  as needed (GERD). 06/26/19   Villa Burgin, Amadeo Garnet, MD  loratadine-pseudoephedrine (CLARITIN-D 24-HOUR) 10-240 MG 24 hr tablet Take 1 tablet by mouth daily as needed (seasonal allergies (springtime)).     [provider]  omeprazole (PRILOSEC) 40 MG capsule Take 1 capsule (40 mg total) by mouth 2 (two) times daily. 06/11/19   Napoleon Form, MD  ondansetron (ZOFRAN ODT) 4 MG disintegrating tablet Take 1 tablet (4 mg total) by mouth every 6 (six) hours as needed for nausea or vomiting. 06/22/19   Cristina Gong, PA-C  promethazine (PHENERGAN) 25 MG suppository Place 1 suppository (25 mg total) rectally every 6 (six) hours as needed for nausea or vomiting. 06/25/19   Harris, Cammy Copa, PA-C  sucralfate (CARAFATE) 1 GM/10ML suspension Take 10 mLs (1 g total) by mouth 4 (four) times daily -  with meals and at bedtime. Patient not taking: Reported on 06/16/2019 06/10/19   Couture, [employer], PA-C  Past Surgical History History reviewed. No pertinent surgical history. Family History Family History  Problem Relation Age of Onset  . Mental illness Sister   . Hyperlipidemia Maternal Grandmother   . Colon cancer Neg Hx   . Esophageal cancer Neg Hx   . Stomach cancer Neg Hx   . Rectal cancer Neg Hx     Social History Social History   Tobacco Use  . Smoking status: Never Smoker  . Smokeless tobacco: Never Used  Substance Use Topics  . Alcohol use: Yes    Alcohol/week: 2.0 standard drinks    Types: 1 Glasses of wine, 1 Shots of liquor per week    Comment: not even 1 a week  . Drug use: No   Allergies Ativan [lorazepam] and Paxil [paroxetine hcl]  Review of Systems Review of Systems  Respiratory: Positive for cough and shortness of breath.   Cardiovascular: Positive for chest pain.  Gastrointestinal: Positive for nausea and vomiting.   All other  systems are reviewed and are negative for acute change except as noted in the HPI  Physical Exam Vital Signs  I have reviewed the triage vital signs BP (!) 143/85   Pulse 80   Temp 99.1 F (37.3 C) (Oral)   Resp (!) 21   Ht 5\' 9"  (1.753 m)   Wt 77.1 kg   SpO2 99%   BMI 25.10 kg/m   Physical Exam Vitals reviewed.  Constitutional:      General: He is not in acute distress.    Appearance: He is well-developed. He is not diaphoretic.  HENT:     Head: Normocephalic and atraumatic.     Nose: Nose normal.  Eyes:     General: No scleral icterus.       Right eye: No discharge.        Left eye: No discharge.     Conjunctiva/sclera: Conjunctivae normal.     Pupils: Pupils are equal, round, and reactive to light.  Cardiovascular:     Rate and Rhythm: Normal rate and regular rhythm.     Heart sounds: No murmur. No friction rub. No gallop.   Pulmonary:     Effort: Pulmonary effort is normal. No respiratory distress.     Breath sounds: Normal breath sounds. No stridor. No rales.  Chest:     Chest wall: Tenderness present.    Abdominal:     General: There is no distension.     Palpations: Abdomen is soft.     Tenderness: There is no abdominal tenderness.  Musculoskeletal:        General: No tenderness.     Cervical back: Normal range of motion and neck supple.  Skin:    General: Skin is warm and dry.     Findings: No erythema or rash.  Neurological:     Mental Status: He is alert and oriented to person, place, and time.     ED Results and Treatments Labs (all labs ordered are listed, but only abnormal results are displayed) Labs Reviewed  BASIC METABOLIC PANEL  CBC  TROPONIN I (HIGH SENSITIVITY)  TROPONIN I (HIGH SENSITIVITY)  EKG  EKG Interpretation  Date/Time:  Wednesday June 25 2019 20:59:30 EST Ventricular Rate:  84 PR Interval:    QRS  Duration: 76 QT Interval:  354 QTC Calculation: 419 R Axis:   71 Text Interpretation: Sinus rhythm No significant change since last tracing Reconfirmed by Addison Lank [phone]) on 06/25/2019 11:51:09 PM      Radiology DG Chest 2 View  Result Date: 06/25/2019 CLINICAL DATA:  Chest pain EXAM: CHEST - 2 VIEW COMPARISON:  June 22, 2019 FINDINGS: The heart size and mediastinal contours are within normal limits. Both lungs are clear. The visualized skeletal structures are unremarkable. IMPRESSION: No active cardiopulmonary disease. Electronically Signed   By: Prudencio Pair M.D.   On: 06/25/2019 21:19    Pertinent labs & imaging results that were available during my care of the patient were reviewed by me and considered in my medical decision making (see chart for details).  Medications Ordered in ED Medications  sodium chloride flush (NS) 0.9 % injection 3 mL (3 mLs Intravenous Not Given 06/26/19 0038)  alum & mag hydroxide-simeth (MAALOX/MYLANTA) 200-200-20 MG/5ML suspension 30 mL (30 mLs Oral Given 06/26/19 0037)    And  lidocaine (XYLOCAINE) 2 % viscous mouth solution 15 mL (15 mLs Oral Given 06/26/19 0037)                                                                                                                                    Procedures Procedures  (including critical care time)  Medical Decision Making / ED Course I have reviewed the nursing notes for this encounter and the patient's prior records (if available in EHR or on provided paperwork).   Colleen Donahoe was evaluated in Emergency Department on 06/26/2019 for the symptoms described in the history of present illness. He was evaluated in the context of the global COVID-19 pandemic, which necessitated consideration that the patient might be at risk for infection with the SARS-CoV-2 virus that causes COVID-19. Institutional protocols and algorithms that pertain to the evaluation of patients at risk for COVID-19 are in a state of  rapid change based on information released by regulatory bodies including the CDC and federal and state organizations. These policies and algorithms were followed during the patient's care in the ED.  Highly atypical chest pain. EKG without acute ischemic changes.  Highly inconsistent with ACS and I have low suspicion for this. Low suspicion for pulmonary embolism. Presentation not classic for aortic dissection or esophageal perforation. Chest x-ray without evidence suggestive of pneumonia, pneumothorax, pneumomediastinum.  No abnormal contour of the mediastinum to suggest dissection. No evidence of acute injuries.  Appears to be GI related.  The patient appears reasonably screened and/or stabilized for discharge and I doubt any other medical condition or other [facility] requiring further screening, evaluation, or treatment in the ED at this time prior to discharge.  The patient is safe for discharge with strict return precautions.  Final Clinical Impression(s) / ED Diagnoses Final diagnoses:  COVID-19 virus infection  Nausea and vomiting in adult  Left-sided chest wall pain     The patient appears reasonably screened and/or stabilized for discharge and I doubt any other medical condition or other [facility] requiring further screening, evaluation, or treatment in the ED at this time prior to discharge.  Disposition: Discharge  Condition: Good  I have discussed the results, Dx and Tx plan with the patient who expressed understanding and agree(s) with the plan. Discharge instructions discussed at great length. The patient was given strict return precautions who verbalized understanding of the instructions. No further questions at time of discharge.    ED Discharge Orders         Ordered    lidocaine (XYLOCAINE) 2 % solution  Every 6 hours PRN     06/26/19 0040           Follow Up: Primary care provider  Schedule an appointment as soon as possible for a visit  If you do not  have a primary care physician, contact [facility] at [phone] for referral     This chart was dictated using voice recognition software.  Despite best efforts to proofread,  errors can occur which can change the documentation meaning.   Nira Conn, MD 06/26/19 [phone]

## 2019-06-27 ENCOUNTER — Telehealth: Payer: Self-pay | Admitting: Gastroenterology

## 2019-06-27 NOTE — Telephone Encounter (Signed)
Spoke with the patient. He reports vomiting his food and feeling weak. Weight loss. Dehydration. Panic attacks because of his symptoms. 3 ED visits since seen 06/16/19. He tells me he stopped the Prilosec because he felt it was causing him to feel "bad" and "light headed." He has replaced it with Pepcid AC and Mylanta. He takes Zofran PRN if he vomits. He has been fever free for 5 days (had COVID19). He is feeling anxious because of his symptoms and his weight loss. He is awakened having a panic attacks. Reviewed diet, instructing him to avoid fatty food, dairy, acidic foods and caffeine. Encourage Pedialyte, simple starches like potatoes, rice, white bread. Do not lie down for at least 2 hours after eating. Eat in small amounts.  He has an appointment here 06/30/19 with you. Please advise. Thanks

## 2019-06-27 NOTE — Telephone Encounter (Signed)
His symptoms are exacerbated due to Covid-19 infection. Please switch appt to virtual visit given he is still symptomatic and is about  only10 days from positive test on 2/8. Thanks

## 2019-06-27 NOTE — Telephone Encounter (Signed)
Spoke with the patient. Advised of the change to a virtual appointment. He will take the Zofran on a scheduled basis instead of waiting until he vomits.

## 2019-06-28 ENCOUNTER — Other Ambulatory Visit: Payer: Self-pay

## 2019-06-28 ENCOUNTER — Emergency Department (HOSPITAL_COMMUNITY)
Admission: EM | Admit: 2019-06-28 | Discharge: 2019-06-28 | Disposition: A | Discharge status: Home / Self Care | Payer: Medicaid Other | Attending: Emergency Medicine | Admitting: Emergency Medicine

## 2019-06-28 ENCOUNTER — Encounter (HOSPITAL_COMMUNITY): Payer: Self-pay | Admitting: *Deleted

## 2019-06-28 DIAGNOSIS — R112 Nausea with vomiting, unspecified: Secondary | ICD-10-CM | POA: Insufficient documentation

## 2019-06-28 DIAGNOSIS — Z8616 Personal history of COVID-19: Secondary | ICD-10-CM | POA: Insufficient documentation

## 2019-06-28 LAB — COMPREHENSIVE METABOLIC PANEL
ALT: 17 U/L (ref 0–44)
AST: 26 U/L (ref 15–41)
Albumin: 4.7 g/dL (ref 3.5–5.0)
Alkaline Phosphatase: 25 U/L — ABNORMAL LOW (ref 38–126)
Anion gap: 10 (ref 5–15)
BUN: 5 mg/dL — ABNORMAL LOW (ref 6–20)
CO2: 24 mmol/L (ref 22–32)
Calcium: 9.5 mg/dL (ref 8.9–10.3)
Chloride: 104 mmol/L (ref 98–111)
Creatinine, Ser: 0.94 mg/dL (ref 0.61–1.24)
GFR calc Af Amer: >60 mL/min (ref >60)
GFR calc non Af Amer: >60 mL/min (ref >60)
Glucose, Bld: 102 mg/dL — ABNORMAL HIGH (ref 70–99)
Potassium: 4.4 mmol/L (ref 3.5–5.1)
Sodium: 138 mmol/L (ref 135–145)
Total Bilirubin: 1.7 mg/dL — ABNORMAL HIGH (ref 0.3–1.2)
Total Protein: 7.6 g/dL (ref 6.5–8.1)

## 2019-06-28 LAB — CBC
HCT: 45.5 % (ref 39.0–52.0)
Hemoglobin: 15.7 g/dL (ref 13.0–17.0)
MCH: 29.8 pg (ref 26.0–34.0)
MCHC: 34.5 g/dL (ref 30.0–36.0)
MCV: 86.3 fL (ref 80.0–100.0)
Platelets: 319 10*3/uL (ref 150–400)
RBC: 5.27 MIL/uL (ref 4.22–5.81)
RDW: 11.4 % — ABNORMAL LOW (ref 11.5–15.5)
WBC: 6.2 10*3/uL (ref 4.0–10.5)
nRBC: 0 % (ref 0.0–0.2)

## 2019-06-28 LAB — LIPASE, BLOOD: Lipase: 32 U/L (ref 11–51)

## 2019-06-28 MED ORDER — PROMETHAZINE HCL 25 MG/ML IJ SOLN
25.0000 mg | Freq: Once | INTRAMUSCULAR | Status: AC
  Administered 2019-06-28: 25 mg via INTRAMUSCULAR
  Filled 2019-06-28: qty 1

## 2019-06-28 MED ORDER — SODIUM CHLORIDE 0.9% FLUSH: 3.0000 mL | Freq: Once | INTRAVENOUS | Status: DC

## 2019-06-28 MED ORDER — SODIUM CHLORIDE 0.9 % IV BOLUS
1000.0000 mL | Freq: Once | INTRAVENOUS | Status: AC
  Administered 2019-06-28: 1000 mL via INTRAVENOUS

## 2019-06-28 NOTE — ED Notes (Signed)
Pt given crackers and water for PO challenge. Pt ate several crackers then stopped stated he felt increased nausea and felt like he may throw up.

## 2019-06-28 NOTE — ED Notes (Signed)
Pt verbalized understanding of d/c instructions, s/s requiring return to ed and follow up care. When asked pt had no questions at this time.

## 2019-06-28 NOTE — ED Provider Notes (Signed)
[facility]-EMERGENCY DEPT [facility] Emergency Department Provider Note MRN:  [id number]  Arrival date & time: 06/28/19     Chief Complaint   Nausea History of Present Illness   Timothy Horn is a 34 y.o. year-old male with a history of GERD presenting to the ED with chief complaint of nausea.  Patient explains that he has chronic GI problems, and they seem to have been exacerbated by recent COVID-19.  This is now day 10 of COVID-19 symptoms.  He endorses continued nausea, 3 episodes of nonbloody nonbilious emesis today.  No diarrhea, diffuse abdominal cramping, mild.  No chest pain or shortness of breath.  Trouble eating and drinking.  Review of Systems  A complete 10 system review of systems was obtained and all systems are negative except as noted in the HPI and PMH.   Patient's Health History    Past Medical History:  Diagnosis Date  . Allergy    seasonal allergies per pt.  . Anxiety   . Asthma   . COVID-19   . GERD (gastroesophageal reflux disease)     History reviewed. No pertinent surgical history.  Family History  Problem Relation Age of Onset  . Mental illness Sister   . Hyperlipidemia Maternal Grandmother   . Colon cancer Neg Hx   . Esophageal cancer Neg Hx   . Stomach cancer Neg Hx   . Rectal cancer Neg Hx     Social History   Socioeconomic History  . Marital status: Married    Spouse name: Programmer, applications  . Number of children: 1  . Years of education: Not on file  . Highest education level: Not on file  Occupational History  . Not on file  Tobacco Use  . Smoking status: Never Smoker  . Smokeless tobacco: Never Used  Substance and Sexual Activity  . Alcohol use: Yes    Alcohol/week: 2.0 standard drinks    Types: 1 Glasses of wine, 1 Shots of liquor per week    Comment: not even 1 a week  . Drug use: No  . Sexual activity: Yes    Partners: Female  Other Topics Concern  . Not on file  Social History Narrative  . Not on file   Social  Determinants of Health   Financial Resource Strain:   . Difficulty of Paying Living Expenses: Not on file  Food Insecurity:   . Worried About Programme researcher, broadcasting/film/video in the Last Year: Not on file  . Ran Out of Food in the Last Year: Not on file  Transportation Needs:   . Lack of Transportation (Medical): Not on file  . Lack of Transportation (Non-Medical): Not on file  Physical Activity:   . Days of Exercise per Week: Not on file  . Minutes of Exercise per Session: Not on file  Stress:   . Feeling of Stress : Not on file  Social Connections:   . Frequency of Communication with Friends and Family: Not on file  . Frequency of Social Gatherings with Friends and Family: Not on file  . Attends Religious Services: Not on file  . Active Member of Clubs or Organizations: Not on file  . Attends Banker Meetings: Not on file  . Marital Status: Not on file  Intimate Partner Violence:   . Fear of Current or Ex-Partner: Not on file  . Emotionally Abused: Not on file  . Physically Abused: Not on file  . Sexually Abused: Not on file     Physical  Exam   Vitals:   06/28/19 0415 06/28/19 0430  BP: 106/77 116/81  Pulse: 67 79  Resp: 16 (!) 23  Temp:    SpO2: 97% 100%    CONSTITUTIONAL: Well-appearing, NAD NEURO:  Alert and oriented x 3, no focal deficits EYES:  eyes equal and reactive ENT/NECK:  no LAD, no JVD CARDIO: Regular rate, well-perfused, normal S1 and S2 PULM:  CTAB no wheezing or rhonchi GI/GU:  normal bowel sounds, non-distended, non-tender MSK/SPINE:  No gross deformities, no edema SKIN:  no rash, atraumatic PSYCH:  Appropriate speech and behavior  *Additional and/or pertinent findings included in MDM below  Diagnostic and Interventional Summary    EKG Interpretation  Date/Time:    Ventricular Rate:    PR Interval:    QRS Duration:   QT Interval:    QTC Calculation:   R Axis:     Text Interpretation:        Cardiac Monitoring Interpretation:   Labs Reviewed  COMPREHENSIVE METABOLIC PANEL - Abnormal; Notable for the following components:      Result Value   Glucose, Bld 102 (*)    BUN 5 (*)    Alkaline Phosphatase 25 (*)    Total Bilirubin 1.7 (*)    All other components within normal limits  CBC - Abnormal; Notable for the following components:   RDW 11.4 (*)    All other components within normal limits  LIPASE, BLOOD  URINALYSIS, ROUTINE W REFLEX MICROSCOPIC    No orders to display    Medications  sodium chloride flush (NS) 0.9 % injection 3 mL (3 mLs Intravenous Not Given 06/28/19 0309)  promethazine (PHENERGAN) injection 25 mg (25 mg Intramuscular Given 06/28/19 0341)  sodium chloride 0.9 % bolus 1,000 mL (0 mLs Intravenous Stopped 06/28/19 0617)     Procedures  /  Critical Care Procedures  ED Course and Medical Decision Making  I have reviewed the triage vital signs, the nursing notes, and pertinent available records from the EMR.  Pertinent labs & imaging results that were available during my care of the patient were reviewed by me and considered in my medical decision making (see below for details).     Reassuring vital signs and abdominal exam, soft, nontender.  Doubt acute or emergent or surgical abdominal process.  Favoring acute exacerbation of chronic GI problems.  Will provide antinausea medication, screening labs to rule out significant dehydration, reassess.  Labs reassuring, patient given a liter of fluid, symptomatic management, tolerating p.o., appropriate for discharge.  Elmer Sow. Pilar Plate, MD [facility] Emergency Medicine [facility] [email or web address]  Final Clinical Impressions(s) / ED Diagnoses     ICD-10-CM   1. Nausea and vomiting, intractability of vomiting not specified, unspecified vomiting type  R11.2     ED Discharge Orders    None       Discharge Instructions Discussed with and Provided to Patient:     Discharge Instructions     You were evaluated in the  Emergency Department and after careful evaluation, we did not find any emergent condition requiring admission or further testing in the hospital.  Your exam/testing today is overall reassuring.  We recommend continuing to take your home medications, including Pepcid, Carafate, with Zofran and Phenergan as needed.  Please return to the Emergency Department if you experience any worsening of your condition.  We encourage you to follow up with a primary care provider.  Thank you for allowing [place] to be a part of your care.  Maudie Flakes, MD 06/28/19 [phone]

## 2019-06-28 NOTE — Discharge Instructions (Addendum)
You were evaluated in the Emergency Department and after careful evaluation, we did not find any emergent condition requiring admission or further testing in the hospital.  Your exam/testing today is overall reassuring.  We recommend continuing to take your home medications, including Pepcid, Carafate, with Zofran and Phenergan as needed.  Please return to the Emergency Department if you experience any worsening of your condition.  We encourage you to follow up with a primary care provider.  Thank you for allowing [place] to be a part of your care.

## 2019-06-28 NOTE — ED Triage Notes (Signed)
Pt arrives via [facility] with c/o GI symptoms.Pt was COVID + on the 28, has had issues with GERD, nausea and vomiting. Pt has seen a GI specialist previously for the same. Taking Zofran without relief. En route vitas 130/94, 88, 97% RA, afebrile

## 2019-06-28 NOTE — ED Triage Notes (Signed)
Pt reporting a burning pain, abdominal discomfort for about 3 weeks. Onset of vomiting this morning. Denies fevers. Feels SOB when the acid reflux worsens. Pt has been taking zofran, it is not helping. Seeing GI specialist, is taking pepcid as prescribed.

## 2019-06-30 ENCOUNTER — Telehealth: Payer: Self-pay | Admitting: Gastroenterology

## 2019-06-30 ENCOUNTER — Ambulatory Visit (INDEPENDENT_AMBULATORY_CARE_PROVIDER_SITE_OTHER): Payer: Self-pay | Admitting: Gastroenterology

## 2019-06-30 DIAGNOSIS — R112 Nausea with vomiting, unspecified: Secondary | ICD-10-CM

## 2019-06-30 DIAGNOSIS — K2100 Gastro-esophageal reflux disease with esophagitis, without bleeding: Secondary | ICD-10-CM

## 2019-06-30 MED ORDER — SUCRALFATE 1 G PO TABS: 1.0000 g | ORAL_TABLET | Freq: Three times a day (TID) | ORAL | 0 refills | Status: DC

## 2019-06-30 MED ORDER — PROMETHAZINE HCL 12.5 MG PO TABS: 12.5000 mg | ORAL_TABLET | Freq: Two times a day (BID) | ORAL | 0 refills | Status: DC | PRN

## 2019-06-30 NOTE — Patient Instructions (Addendum)
Discontinue Zofran  Please send prescription for Phenergan 12.5 mg twice daily as needed X 30 tablets with no refills  Discontinue Carafate suspension, is not covered by his insurance.  Please send Rx for Carafate 1 g tablets before meals and at bedtime as needed X1 20 tablets with no refills.  Follow-up office visit in 4 to 6 weeks, next available appointment.    I appreciate the  opportunity to care for you  Thank You   Marsa Aris , MD

## 2019-06-30 NOTE — Telephone Encounter (Signed)
Left this information on his voicemail. 

## 2019-06-30 NOTE — Telephone Encounter (Signed)
Patient did not tell you that he is not taking Prilosec at all. Felt it made his nausea worse. He takes Pepcid most days. When his nausea is bad he does not take it. He also is on hydroxyzine for his anxiety. This is new for him. Any concerns with any of this? Thanks

## 2019-06-30 NOTE — Telephone Encounter (Signed)
Pt had a virtual visit and forgot to report that he takes Pepcid daily but Prilosec makes him nauseous.  Please call to discuss.

## 2019-06-30 NOTE — Telephone Encounter (Signed)
I will remove Prilosec from his medication list.  Ok to continue Pepcid as needed.  Ok to continue hydroxyzine as needed.  Thank you

## 2019-06-30 NOTE — Progress Notes (Signed)
Timothy Horn    [id number]    05/06/1986  Primary Care Physician:Patient, No Pcp Per  Referring Physician: No referring provider defined for this encounter.  This service was provided via  telemedicine due to COVID 19 pandemic.  I connected with@ on 06/30/19 at  1:10 PM EST by a video enabled telemedicine application and verified that I am speaking with the correct person using two identifiers.  Patient location: Home Provider location: Office   I discussed the limitations, risks, security and privacy concerns of performing an evaluation and management service by video enabled telemedicine application and the availability of in person appointments. I also discussed with the patient that there may be a patient responsible charge related to this service. The patient expressed understanding and agreed to proceed.   The persons participating in this telemedicine service were myself and the patient  Interactive audio and video telecommunications were attempted between this provider and patient, however failed, due to patient having technical difficulties OR patient did not have access to video capability. We continued and completed visit with audio only.    Chief complaint: Nausea and vomiting, abdominal cramping  HPI:  He complains of decreased appetite, persistent nausea and vomiting.  Somewhat better since he got Phenergan suppositories from recent ER visit he has not had any episode of vomiting since yesterday.  He is going to try to eat something.  Denies any hematemesis, melena or blood per rectum.  Multiple ER visits , of note he has been in the ER 6 times since May 23, 2019.  He was seen in ER on January 6, January 19, January 31, twice on February 3 and most recent on February 6, he was discharged home during all of his recent ER visits  Patient tested positive for COVID-19 on January 28  EGD June 16, 2019: Mild esophagitis and duodenitis.  Esophageal  biopsies negative for eosinophilic esophagitis and duodenal biopsies showed mild peptic duodenitis  Abdominal ultrasound right upper quadrant June 10, 2019: Normal  Multiple ER visits , of note he has been in the ER 6 times since May 23, 2019.  He was seen in ER on January 6, January 19, January 31, twice on February 3 and most recent on February 6, he was discharged home during all of his recent ER visits   Outpatient Encounter Medications as of 06/30/2019  Medication Sig  . albuterol (PROVENTIL HFA;VENTOLIN HFA) 108 (90 Base) MCG/ACT inhaler Inhale 2 puffs into the lungs every 4 (four) hours as needed for wheezing or shortness of breath.  . calcium carbonate (TUMS EX) 750 MG chewable tablet Chew 1 tablet by mouth as needed for heartburn.   . famotidine (PEPCID) 20 MG tablet Take 1 tablet (20 mg total) by mouth 2 (two) times daily for 14 days. (Patient not taking: Reported on 06/28/2019)  . hydrOXYzine (ATARAX/VISTARIL) 25 MG tablet Take 1 tablet (25 mg total) by mouth every 6 (six) hours as needed for anxiety.  . lidocaine (XYLOCAINE) 2 % solution Use as directed 15 mLs in the mouth or throat every 6 (six) hours as needed (GERD).  [name] loratadine-pseudoephedrine (CLARITIN-D 24-HOUR) 10-240 MG 24 hr tablet Take 1 tablet by mouth daily as needed (seasonal allergies (springtime)).   [name] omeprazole (PRILOSEC) 40 MG capsule Take 1 capsule (40 mg total) by mouth 2 (two) times daily.  . ondansetron (ZOFRAN ODT) 4 MG disintegrating tablet Take 1 tablet (4 mg total) by mouth every 6 (  six) hours as needed for nausea or vomiting.  . promethazine (PHENERGAN) 25 MG suppository Place 1 suppository (25 mg total) rectally every 6 (six) hours as needed for nausea or vomiting.  . sucralfate (CARAFATE) 1 GM/10ML suspension Take 10 mLs (1 g total) by mouth 4 (four) times daily -  with meals and at bedtime. (Patient not taking: Reported on 06/16/2019)   No facility-administered encounter medications on file as of  06/30/2019.    Allergies as of 06/30/2019 - Review Complete 06/28/2019  Allergen Reaction Noted  . Ativan [lorazepam] Anxiety and Other (See Comments) 11/30/2014  . Paxil [paroxetine hcl] Anxiety and Other (See Comments) 11/30/2014    Past Medical History:  Diagnosis Date  . Allergy    seasonal allergies per pt.  . Anxiety   . Asthma   . COVID-19   . GERD (gastroesophageal reflux disease)     No past surgical history on file.  Family History  Problem Relation Age of Onset  . Mental illness Sister   . Hyperlipidemia Maternal Grandmother   . Colon cancer Neg Hx   . Esophageal cancer Neg Hx   . Stomach cancer Neg Hx   . Rectal cancer Neg Hx     Social History   Socioeconomic History  . Marital status: Married    Spouse name: Clinical cytogeneticist  . Number of children: 1  . Years of education: Not on file  . Highest education level: Not on file  Occupational History  . Not on file  Tobacco Use  . Smoking status: Never Smoker  . Smokeless tobacco: Never Used  Substance and Sexual Activity  . Alcohol use: Yes    Alcohol/week: 2.0 standard drinks    Types: 1 Glasses of wine, 1 Shots of liquor per week    Comment: not even 1 a week  . Drug use: No  . Sexual activity: Yes    Partners: Female  Other Topics Concern  . Not on file  Social History Narrative  . Not on file   Social Determinants of Health   Financial Resource Strain:   . Difficulty of Paying Living Expenses: Not on file  Food Insecurity:   . Worried About Charity fundraiser in the Last Year: Not on file  . Ran Out of Food in the Last Year: Not on file  Transportation Needs:   . Lack of Transportation (Medical): Not on file  . Lack of Transportation (Non-Medical): Not on file  Physical Activity:   . Days of Exercise per Week: Not on file  . Minutes of Exercise per Session: Not on file  Stress:   . Feeling of Stress : Not on file  Social Connections:   . Frequency of Communication with Friends and  Family: Not on file  . Frequency of Social Gatherings with Friends and Family: Not on file  . Attends Religious Services: Not on file  . Active Member of Clubs or Organizations: Not on file  . Attends Archivist Meetings: Not on file  . Marital Status: Not on file  Intimate Partner Violence:   . Fear of Current or Ex-Partner: Not on file  . Emotionally Abused: Not on file  . Physically Abused: Not on file  . Sexually Abused: Not on file      Review of systems: Review of Systems as per HPI All other systems reviewed and are negative.   Observations/Objective:   Data Reviewed:  Reviewed labs, radiology imaging, old records and pertinent past GI  work up   Assessment and Plan/Recommendations:  34 year old recent Covid positive with complaints of decreased appetite, nausea and vomiting Dyspepsia and reflux symptoms exacerbated by active Covid infection Continue small frequent meals Discussed antireflux measures and lifestyle modifications Use Phenergan 12.5 mg twice daily as needed for severe nausea and vomiting Pepcid twice daily as needed Return in 4 to 6 weeks or sooner   I discussed the assessment and treatment plan with the patient. The patient was provided an opportunity to ask questions and all were answered. The patient agreed with the plan and demonstrated an understanding of the instructions.   The patient was advised to call back or seek an in-person evaluation if the symptoms worsen or if the condition fails to improve as anticipated.  I provided 22 minutes of non-face-to-face time during this encounter.   Marsa Aris, MD   CC: No ref. provider found

## 2019-07-03 ENCOUNTER — Encounter: Payer: Self-pay | Admitting: Gastroenterology

## 2019-07-09 ENCOUNTER — Encounter (HOSPITAL_COMMUNITY): Payer: Self-pay | Admitting: *Deleted

## 2019-07-09 ENCOUNTER — Other Ambulatory Visit: Payer: Self-pay

## 2019-07-09 ENCOUNTER — Emergency Department (HOSPITAL_COMMUNITY)
Admission: EM | Admit: 2019-07-09 | Discharge: 2019-07-10 | Disposition: A | Discharge status: Home / Self Care | Payer: Medicaid Other | Attending: Emergency Medicine | Admitting: Emergency Medicine

## 2019-07-09 DIAGNOSIS — J45909 Unspecified asthma, uncomplicated: Secondary | ICD-10-CM | POA: Insufficient documentation

## 2019-07-09 DIAGNOSIS — R0789 Other chest pain: Secondary | ICD-10-CM | POA: Insufficient documentation

## 2019-07-09 DIAGNOSIS — Z8616 Personal history of COVID-19: Secondary | ICD-10-CM | POA: Insufficient documentation

## 2019-07-09 DIAGNOSIS — M79604 Pain in right leg: Secondary | ICD-10-CM | POA: Insufficient documentation

## 2019-07-09 DIAGNOSIS — Z79899 Other long term (current) drug therapy: Secondary | ICD-10-CM | POA: Insufficient documentation

## 2019-07-09 DIAGNOSIS — M79605 Pain in left leg: Secondary | ICD-10-CM | POA: Insufficient documentation

## 2019-07-09 NOTE — ED Triage Notes (Signed)
The pt is c.o bi-lateral calf pain and rt lower rib pain for approx one week  He thinks it may have been caused by covid  He tested positive jan 28th

## 2019-07-10 ENCOUNTER — Emergency Department (HOSPITAL_COMMUNITY): Payer: Medicaid Other

## 2019-07-10 ENCOUNTER — Other Ambulatory Visit: Payer: Self-pay

## 2019-07-10 ENCOUNTER — Ambulatory Visit (HOSPITAL_BASED_OUTPATIENT_CLINIC_OR_DEPARTMENT_OTHER)
Admission: RE | Admit: 2019-07-10 | Discharge: 2019-07-10 | Disposition: A | Discharge status: Home / Self Care | Payer: Self-pay | Source: Ambulatory Visit | Attending: Emergency Medicine | Admitting: Emergency Medicine

## 2019-07-10 DIAGNOSIS — M79609 Pain in unspecified limb: Secondary | ICD-10-CM

## 2019-07-10 NOTE — ED Notes (Signed)
Pt taken to and from xray in NAD 

## 2019-07-10 NOTE — ED Provider Notes (Signed)
[facility] EMERGENCY DEPARTMENT Provider Note   CSN: [id number] Arrival date & time: 07/09/19  2143     History Chief Complaint  Patient presents with  . Leg Pain    Timothy Horn is a 34 y.o. male who presents emergency department chief complaint of bilateral leg pain.  Patient is concerned he may have blood clots because he had coronavirus.  Patient states that it began behind his right knee and now is also behind his left knee.  He has noted no swelling in the lower extremities.  He is having pain at the chest tube site on the right side of his chest wall.  He had a spontaneous pneumothorax several years ago.  He states that when he got coronavirus that the area began aching.  Denies any pleuritic chest pain, shortness of breath, hemoptysis, syncope, or other abnormal symptoms.  HPI     Past Medical History:  Diagnosis Date  . Allergy    seasonal allergies per pt.  . Anxiety   . Asthma   . COVID-19   . GERD (gastroesophageal reflux disease)     Patient Active Problem List   Diagnosis Date Noted  . Atelectasis   . Spontenous pneumothorax of right lung 06/16/2018    History reviewed. No pertinent surgical history.     Family History  Problem Relation Age of Onset  . Mental illness Sister   . Hyperlipidemia Maternal Grandmother   . Colon cancer Neg Hx   . Esophageal cancer Neg Hx   . Stomach cancer Neg Hx   . Rectal cancer Neg Hx     Social History   Tobacco Use  . Smoking status: Never Smoker  . Smokeless tobacco: Never Used  Substance Use Topics  . Alcohol use: Yes    Alcohol/week: 2.0 standard drinks    Types: 1 Glasses of wine, 1 Shots of liquor per week    Comment: not even 1 a week  . Drug use: No    Home Medications Prior to Admission medications   Medication Sig Start Date End Date Taking? Authorizing Provider  albuterol (PROVENTIL HFA;VENTOLIN HFA) 108 (90 Base) MCG/ACT inhaler Inhale 2 puffs into the lungs every 4  (four) hours as needed for wheezing or shortness of breath. 07/02/18   Ali Lowe, MD  calcium carbonate (TUMS EX) 750 MG chewable tablet Chew 1 tablet by mouth as needed for heartburn.     [provider]  famotidine (PEPCID) 20 MG tablet Take 1 tablet (20 mg total) by mouth 2 (two) times daily for 14 days. Patient not taking: Reported on 06/28/2019 06/10/19 06/27/28  Couture, Cortni S, PA-C  hydrOXYzine (ATARAX/VISTARIL) 25 MG tablet Take 1 tablet (25 mg total) by mouth every 6 (six) hours as needed for anxiety. 06/25/19   Daaiel Starlin, Cammy Copa, PA-C  lidocaine (XYLOCAINE) 2 % solution Use as directed 15 mLs in the mouth or throat every 6 (six) hours as needed (GERD). 06/26/19   Cardama, Amadeo Garnet, MD  loratadine-pseudoephedrine (CLARITIN-D 24-HOUR) 10-240 MG 24 hr tablet Take 1 tablet by mouth daily as needed (seasonal allergies (springtime)).     [provider]  omeprazole (PRILOSEC) 40 MG capsule Take 1 capsule (40 mg total) by mouth 2 (two) times daily. 06/11/19   Napoleon Form, MD  promethazine (PHENERGAN) 12.5 MG tablet Take 1 tablet (12.5 mg total) by mouth 2 (two) times daily as needed for nausea or vomiting. 06/30/19   Napoleon Form, MD  promethazine (PHENERGAN)  25 MG suppository Place 1 suppository (25 mg total) rectally every 6 (six) hours as needed for nausea or vomiting. 06/25/19   Arthor Captain, PA-C  sucralfate (CARAFATE) 1 g tablet Take 1 tablet (1 g total) by mouth 4 (four) times daily -  with meals and at bedtime. 06/30/19   Napoleon Form, MD    Allergies    Ativan [lorazepam] and Paxil [paroxetine hcl]  Review of Systems   Review of Systems Ten systems reviewed and are negative for acute change, except as noted in the HPI.   Physical Exam Updated Vital Signs BP 108/74 (BP Location: Left Arm)   Pulse 73   Temp 97.9 F (36.6 C) (Oral)   Resp 18   Ht 5\' 9"  (1.753 m)   Wt 77.1 kg   SpO2 99%   BMI 25.10 kg/m   Physical Exam Vitals and nursing  note reviewed.  Constitutional:      General: He is not in acute distress.    Appearance: He is well-developed. He is not diaphoretic.  HENT:     Head: Normocephalic and atraumatic.  Eyes:     General: No scleral icterus.    Conjunctiva/sclera: Conjunctivae normal.  Cardiovascular:     Rate and Rhythm: Normal rate and regular rhythm.     Heart sounds: Normal heart sounds.  Pulmonary:     Effort: Pulmonary effort is normal. No respiratory distress.     Breath sounds: Normal breath sounds. No wheezing or rhonchi.  Chest:     Chest wall: No tenderness.  Abdominal:     Palpations: Abdomen is soft.     Tenderness: There is no abdominal tenderness.  Musculoskeletal:     Cervical back: Normal range of motion and neck supple.     Right lower leg: No edema.     Left lower leg: No edema.     Comments: No appreciable edema in the lower extremities, tenderness behind the bilateral knees.  Skin:    General: Skin is warm and dry.  Neurological:     Mental Status: He is alert.  Psychiatric:        Behavior: Behavior normal.     ED Results / Procedures / Treatments   Labs (all labs ordered are listed, but only abnormal results are displayed) Labs Reviewed - No data to display  EKG None  Radiology No results found.  Procedures Procedures (including critical care time)  Medications Ordered in ED Medications - No data to display  ED Course  I have reviewed the triage vital signs and the nursing notes.  Pertinent labs & imaging results that were available during my care of the patient were reviewed by me and considered in my medical decision making (see chart for details).    MDM Rules/Calculators/A&P                     Patient here with leg pain, no appreciable leg swelling. I personally reviewed the patient's 2 view chest x-ray which shows no acute abnormalities.  Patient would like a DVT rule out.  He has declined anticoagulation therapy.  I discussed risks and benefits.   He is competent to make this decision.  Patient is appropriate for discharge at this time.  He may return for vascular ultrasound the bilateral lower extremities at a later time. Final Clinical Impression(s) / ED Diagnoses Final diagnoses:  Bilateral leg pain    Rx / DC Orders ED Discharge Orders    None  Margarita Mail, PA-C 07/10/19 0618    Fatima Blank, MD 07/10/19 [phone]

## 2019-07-10 NOTE — ED Notes (Signed)
Assumed care of pt. Pt alert, resting on cart in NAD. VSS on 3L [place]. Bed locked, in lowest position, siderails upx2. Hourly rounds completed. Will continue to monitor

## 2019-07-10 NOTE — Discharge Instructions (Addendum)
Return immediately for loss of consciousness, sudden onset severe chest pain and shortness of breath, pain with deep inhalation.

## 2019-07-10 NOTE — ED Notes (Signed)
Patient verbalizes understanding of discharge instructions. Opportunity for questioning and answers were provided.All questions answered completely.  Armband removed by staff, pt discharged from ED. Ambulatory with strong, steady gait 

## 2019-07-18 ENCOUNTER — Other Ambulatory Visit: Payer: Self-pay

## 2019-07-18 ENCOUNTER — Ambulatory Visit (INDEPENDENT_AMBULATORY_CARE_PROVIDER_SITE_OTHER): Payer: Self-pay | Admitting: Primary Care

## 2019-07-18 ENCOUNTER — Encounter (INDEPENDENT_AMBULATORY_CARE_PROVIDER_SITE_OTHER): Payer: Self-pay | Admitting: Primary Care

## 2019-07-18 DIAGNOSIS — R209 Unspecified disturbances of skin sensation: Secondary | ICD-10-CM

## 2019-07-18 DIAGNOSIS — R12 Heartburn: Secondary | ICD-10-CM

## 2019-07-18 DIAGNOSIS — Z7689 Persons encountering health services in other specified circumstances: Secondary | ICD-10-CM

## 2019-07-18 NOTE — Progress Notes (Signed)
Pt has concerns with breathing since having a chest tube  Pt complains of acid reflux and indigestion Pt complains of having cold hands and feet

## 2019-07-18 NOTE — Progress Notes (Signed)
Virtual Visit via Telephone Note  I connected with Timothy Horn on 07/18/19 at 10:30 AM EST by telephone and verified that I am speaking with the correct person using two identifiers.   I discussed the limitations, risks, security and privacy concerns of performing an evaluation and management service by telephone and the availability of in person appointments. I also discussed with the patient that there may be a patient responsible charge related to this service. The patient expressed understanding and agreed to proceed.   History of Present Illness: Timothy Horn is having a tele visit to establish care. Followed by Gastrology  Dr. Silverio Decamp he mention some gastric reflux were food felt like it was stuck. He has notice improvement with diet modification. States he takes Tums and pepcid manages.   Past Medical History:  Diagnosis Date  . Allergy    seasonal allergies per pt.  . Anxiety   . Asthma   . COVID-19   . GERD (gastroesophageal reflux disease)    Current Outpatient Medications on File Prior to Visit  Medication Sig Dispense Refill  . famotidine (PEPCID) 20 MG tablet Take 1 tablet (20 mg total) by mouth 2 (two) times daily for 14 days. 28 tablet 0  . albuterol (PROVENTIL HFA;VENTOLIN HFA) 108 (90 Base) MCG/ACT inhaler Inhale 2 puffs into the lungs every 4 (four) hours as needed for wheezing or shortness of breath. (Patient not taking: Reported on 07/18/2019) 1 Inhaler 0  . calcium carbonate (TUMS EX) 750 MG chewable tablet Chew 1 tablet by mouth as needed for heartburn.     . hydrOXYzine (ATARAX/VISTARIL) 25 MG tablet Take 1 tablet (25 mg total) by mouth every 6 (six) hours as needed for anxiety. (Patient not taking: Reported on 07/18/2019) 20 tablet 0  . loratadine-pseudoephedrine (CLARITIN-D 24-HOUR) 10-240 MG 24 hr tablet Take 1 tablet by mouth daily as needed (seasonal allergies (springtime)).     [name] omeprazole (PRILOSEC) 40 MG capsule Take 1 capsule (40 mg total) by  mouth 2 (two) times daily. (Patient not taking: Reported on 07/18/2019) 90 capsule 3  . promethazine (PHENERGAN) 12.5 MG tablet Take 1 tablet (12.5 mg total) by mouth 2 (two) times daily as needed for nausea or vomiting. (Patient not taking: Reported on 07/18/2019) 30 tablet 0  . sucralfate (CARAFATE) 1 g tablet Take 1 tablet (1 g total) by mouth 4 (four) times daily -  with meals and at bedtime. (Patient not taking: Reported on 07/18/2019) 120 tablet 0   No current facility-administered medications on file prior to visit.   Observations/Objective: Review of Systems  Gastrointestinal: Positive for abdominal pain, heartburn, nausea and vomiting.       Followed by gastrology   All other systems reviewed and are negative.  Assessment and Plan: Zohan was seen today for new patient (initial visit).  Diagnoses and all orders for this visit:  Encounter to establish care Juluis Mire, NP-C will be your  (PCP) she is mastered prepared . She is skilled to diagnosed and treat illness. Also able to answer health concern as well as continuing care of varied medical conditions, not limited by cause, organ system, or diagnosis.     Follow Up Instructions:    I discussed the assessment and treatment plan with the patient. The patient was provided an opportunity to ask questions and all were answered. The patient agreed with the plan and demonstrated an understanding of the instructions.   The patient was advised to call back or seek an in-person evaluation if  the symptoms worsen or if the condition fails to improve as anticipated.  I provided 10 minutes of non-face-to-face time during this encounter. Includes reviewing encounters, labs and imaging    Grayce Sessions, NP

## 2019-07-24 ENCOUNTER — Encounter (INDEPENDENT_AMBULATORY_CARE_PROVIDER_SITE_OTHER): Payer: Self-pay | Admitting: Primary Care

## 2019-07-24 ENCOUNTER — Other Ambulatory Visit: Payer: Self-pay

## 2019-07-24 ENCOUNTER — Ambulatory Visit (INDEPENDENT_AMBULATORY_CARE_PROVIDER_SITE_OTHER): Payer: Self-pay | Admitting: Primary Care

## 2019-07-24 VITALS — BP 120/76 | HR 100 | Temp 97.2°F | Ht 69.0 in | Wt 166.6 lb

## 2019-07-24 DIAGNOSIS — J9801 Acute bronchospasm: Secondary | ICD-10-CM

## 2019-07-24 DIAGNOSIS — Z0000 Encounter for general adult medical examination without abnormal findings: Secondary | ICD-10-CM

## 2019-07-24 DIAGNOSIS — R209 Unspecified disturbances of skin sensation: Secondary | ICD-10-CM

## 2019-07-24 DIAGNOSIS — F418 Other specified anxiety disorders: Secondary | ICD-10-CM

## 2019-07-24 DIAGNOSIS — Z8616 Personal history of COVID-19: Secondary | ICD-10-CM

## 2019-07-24 MED ORDER — ALBUTEROL SULFATE HFA 108 (90 BASE) MCG/ACT IN AERS: 2.0000 | INHALATION_SPRAY | Freq: Four times a day (QID) | RESPIRATORY_TRACT | 1 refills | Status: DC | PRN

## 2019-07-24 NOTE — Progress Notes (Signed)
Pt complains of chronic right side pain  Pt complains of weakness in both legs since having covid Pt states he has had weight loss due to covid and acid reflux

## 2019-07-24 NOTE — Patient Instructions (Signed)

## 2019-07-24 NOTE — Progress Notes (Signed)
Established Patient Office Visit  Subjective:  Patient ID: Timothy Horn, male    DOB: Sep 18, 1985  Age: 34 y.o. MRN: [id number]  CC:  Chief Complaint  Patient presents with  . Annual Exam    HPI Timothy Horn presents for annual physical. Complains of pleuritic pain on the left side only with deep breath he has not progress much since COVID he has increase anxiety and depression . He and his son had COVID and his wife did not. No rhyme or reason. Son was sick 1 day.   Past Medical History:  Diagnosis Date  . Allergy    seasonal allergies per pt.  . Anxiety   . Asthma   . COVID-19   . GERD (gastroesophageal reflux disease)     History reviewed. No pertinent surgical history.  Family History  Problem Relation Age of Onset  . Mental illness Sister   . Hyperlipidemia Maternal Grandmother   . Colon cancer Neg Hx   . Esophageal cancer Neg Hx   . Stomach cancer Neg Hx   . Rectal cancer Neg Hx     Social History   Socioeconomic History  . Marital status: Married    Spouse name: Clinical cytogeneticist  . Number of children: 1  . Years of education: Not on file  . Highest education level: Not on file  Occupational History  . Not on file  Tobacco Use  . Smoking status: Never Smoker  . Smokeless tobacco: Never Used  Substance and Sexual Activity  . Alcohol use: Yes    Alcohol/week: 2.0 standard drinks    Types: 1 Glasses of wine, 1 Shots of liquor per week    Comment: not even 1 a week  . Drug use: No  . Sexual activity: Yes    Partners: Female  Other Topics Concern  . Not on file  Social History Narrative  . Not on file   Social Determinants of Health   Financial Resource Strain:   . Difficulty of Paying Living Expenses: Not on file  Food Insecurity:   . Worried About Charity fundraiser in the Last Year: Not on file  . Ran Out of Food in the Last Year: Not on file  Transportation Needs:   . Lack of Transportation (Medical): Not on file  . Lack of  Transportation (Non-Medical): Not on file  Physical Activity:   . Days of Exercise per Week: Not on file  . Minutes of Exercise per Session: Not on file  Stress:   . Feeling of Stress : Not on file  Social Connections:   . Frequency of Communication with Friends and Family: Not on file  . Frequency of Social Gatherings with Friends and Family: Not on file  . Attends Religious Services: Not on file  . Active Member of Clubs or Organizations: Not on file  . Attends Archivist Meetings: Not on file  . Marital Status: Not on file  Intimate Partner Violence:   . Fear of Current or Ex-Partner: Not on file  . Emotionally Abused: Not on file  . Physically Abused: Not on file  . Sexually Abused: Not on file    Outpatient Medications Prior to Visit  Medication Sig Dispense Refill  . calcium carbonate (TUMS EX) 750 MG chewable tablet Chew 1 tablet by mouth as needed for heartburn.     . famotidine (PEPCID) 20 MG tablet Take 1 tablet (20 mg total) by mouth 2 (two) times daily for 14 days. 28 tablet  0  . loratadine-pseudoephedrine (CLARITIN-D 24-HOUR) 10-240 MG 24 hr tablet Take 1 tablet by mouth daily as needed (seasonal allergies (springtime)).     [name] albuterol (PROVENTIL HFA;VENTOLIN HFA) 108 (90 Base) MCG/ACT inhaler Inhale 2 puffs into the lungs every 4 (four) hours as needed for wheezing or shortness of breath. 1 Inhaler 0  . hydrOXYzine (ATARAX/VISTARIL) 25 MG tablet Take 1 tablet (25 mg total) by mouth every 6 (six) hours as needed for anxiety. (Patient not taking: Reported on 07/18/2019) 20 tablet 0  . omeprazole (PRILOSEC) 40 MG capsule Take 1 capsule (40 mg total) by mouth 2 (two) times daily. (Patient not taking: Reported on 07/18/2019) 90 capsule 3  . promethazine (PHENERGAN) 12.5 MG tablet Take 1 tablet (12.5 mg total) by mouth 2 (two) times daily as needed for nausea or vomiting. (Patient not taking: Reported on 07/18/2019) 30 tablet 0  . sucralfate (CARAFATE) 1 g tablet Take 1  tablet (1 g total) by mouth 4 (four) times daily -  with meals and at bedtime. (Patient not taking: Reported on 07/18/2019) 120 tablet 0   No facility-administered medications prior to visit.    Allergies  Allergen Reactions  . Ativan [Lorazepam] Anxiety and Other (See Comments)    Loss of appetite, shaky   . Paxil [Paroxetine Hcl] Anxiety and Other (See Comments)    Dizziness, insomnia    ROS Review of Systems  Respiratory: Positive for shortness of breath.   Gastrointestinal: Positive for abdominal pain.       Following up with GI appointment schedule this month  Neurological: Positive for weakness.  All other systems reviewed and are negative.     Objective:    Physical Exam  Constitutional: He is oriented to person, place, and time. He appears well-developed and well-nourished.  HENT:  Head: Normocephalic and atraumatic.  Eyes: Pupils are equal, round, and reactive to light. EOM are normal.  Cardiovascular: Normal rate and regular rhythm.  Abdominal: Soft. Bowel sounds are normal.  Musculoskeletal:        General: Normal range of motion.     Cervical back: Normal range of motion and neck supple.  Neurological: He is oriented to person, place, and time.  Skin: Skin is warm and dry.  Psychiatric: He has a normal mood and affect. His behavior is normal. Judgment and thought content normal.    BP 120/76 (BP Location: Right Arm, Patient Position: Sitting, Cuff Size: Normal)   Pulse 100   Temp (!) 97.2 F (36.2 C) (Temporal)   Ht 5' 9"  (1.753 m)   Wt 166 lb 9.6 oz (75.6 kg)   SpO2 99%   BMI 24.60 kg/m  Wt Readings from Last 3 Encounters:  07/24/19 166 lb 9.6 oz (75.6 kg)  07/09/19 169 lb 15.6 oz (77.1 kg)  06/25/19 170 lb (77.1 kg)     There are no preventive care reminders to display for this patient.  There are no preventive care reminders to display for this patient.  No results found for: TSH Lab Results  Component Value Date   WBC 6.2 06/28/2019   HGB  15.7 06/28/2019   HCT 45.5 06/28/2019   MCV 86.3 06/28/2019   PLT 319 06/28/2019   Lab Results  Component Value Date   NA 138 06/28/2019   K 4.4 06/28/2019   CO2 24 06/28/2019   GLUCOSE 102 (H) 06/28/2019   BUN 5 (L) 06/28/2019   CREATININE 0.94 06/28/2019   BILITOT 1.7 (H) 06/28/2019   ALKPHOS 25 (  L) 06/28/2019   AST 26 06/28/2019   ALT 17 06/28/2019   PROT 7.6 06/28/2019   ALBUMIN 4.7 06/28/2019   CALCIUM 9.5 06/28/2019   ANIONGAP 10 06/28/2019   No results found for: CHOL No results found for: HDL No results found for: LDLCALC No results found for: TRIG No results found for: CHOLHDL No results found for: HGBA1C    Assessment & Plan:  Timothy Horn was seen today for annual exam.  Diagnoses and all orders for this visit:  Bronchospasm  pleuritic pain  with deep breaths prescribed SABA and advised to use incentive spirometer every 1-2 hours   Depression with anxiety No medication at time prescribe stated only medication that has actually helped was Xanax and will prescribe that medication. Discussed taking time for self, imagery and exercise also available is CSW  Annual physical exam All health maintaince  and quality metric met .  Cold hands and feet Reviewed labs no anemia present other etiology inflammatory process taking place  -     TSH + free T4  Other orders -     albuterol (VENTOLIN HFA) 108 (90 Base) MCG/ACT inhaler; Inhale 2 puffs into the lungs every 6 (six) hours as needed for wheezing or shortness of breath.    Follow-up: Return if symptoms worsen or fail to improve, for CSW next available .    Kerin Perna, NP

## 2019-07-25 LAB — TSH+FREE T4
Free T4: 1.47 ng/dL (ref 0.82–1.77)
TSH: 1.53 u[IU]/mL (ref 0.450–4.500)

## 2019-08-01 ENCOUNTER — Emergency Department (HOSPITAL_COMMUNITY)
Admission: EM | Admit: 2019-08-01 | Discharge: 2019-08-01 | Disposition: A | Discharge status: Home / Self Care | Payer: Medicaid Other | Attending: Emergency Medicine | Admitting: Emergency Medicine

## 2019-08-01 ENCOUNTER — Other Ambulatory Visit: Payer: Self-pay

## 2019-08-01 ENCOUNTER — Encounter (HOSPITAL_COMMUNITY): Payer: Self-pay | Admitting: Pediatrics

## 2019-08-01 ENCOUNTER — Emergency Department (HOSPITAL_COMMUNITY): Payer: Medicaid Other

## 2019-08-01 DIAGNOSIS — Z8616 Personal history of COVID-19: Secondary | ICD-10-CM | POA: Insufficient documentation

## 2019-08-01 DIAGNOSIS — K219 Gastro-esophageal reflux disease without esophagitis: Secondary | ICD-10-CM | POA: Insufficient documentation

## 2019-08-01 DIAGNOSIS — R1013 Epigastric pain: Secondary | ICD-10-CM

## 2019-08-01 DIAGNOSIS — R0789 Other chest pain: Secondary | ICD-10-CM | POA: Insufficient documentation

## 2019-08-01 DIAGNOSIS — J45909 Unspecified asthma, uncomplicated: Secondary | ICD-10-CM | POA: Insufficient documentation

## 2019-08-01 DIAGNOSIS — R072 Precordial pain: Secondary | ICD-10-CM

## 2019-08-01 DIAGNOSIS — R112 Nausea with vomiting, unspecified: Secondary | ICD-10-CM

## 2019-08-01 LAB — COMPREHENSIVE METABOLIC PANEL
ALT: 16 U/L (ref 0–44)
AST: 19 U/L (ref 15–41)
Albumin: 4.6 g/dL (ref 3.5–5.0)
Alkaline Phosphatase: 29 U/L — ABNORMAL LOW (ref 38–126)
Anion gap: 10 (ref 5–15)
BUN: 7 mg/dL (ref 6–20)
CO2: 27 mmol/L (ref 22–32)
Calcium: 9.5 mg/dL (ref 8.9–10.3)
Chloride: 102 mmol/L (ref 98–111)
Creatinine, Ser: 1.03 mg/dL (ref 0.61–1.24)
GFR calc Af Amer: >60 mL/min (ref >60)
GFR calc non Af Amer: >60 mL/min (ref >60)
Glucose, Bld: 104 mg/dL — ABNORMAL HIGH (ref 70–99)
Potassium: 3.7 mmol/L (ref 3.5–5.1)
Sodium: 139 mmol/L (ref 135–145)
Total Bilirubin: 0.7 mg/dL (ref 0.3–1.2)
Total Protein: 7.6 g/dL (ref 6.5–8.1)

## 2019-08-01 LAB — URINALYSIS, ROUTINE W REFLEX MICROSCOPIC
Bilirubin Urine: NEGATIVE
Glucose, UA: NEGATIVE mg/dL
Hgb urine dipstick: NEGATIVE
Ketones, ur: NEGATIVE mg/dL
Leukocytes,Ua: NEGATIVE
Nitrite: NEGATIVE
Protein, ur: NEGATIVE mg/dL
Specific Gravity, Urine: 1.006 (ref 1.005–1.030)
pH: 6 (ref 5.0–8.0)

## 2019-08-01 LAB — CBC
HCT: 45.3 % (ref 39.0–52.0)
Hemoglobin: 15.3 g/dL (ref 13.0–17.0)
MCH: 29.8 pg (ref 26.0–34.0)
MCHC: 33.8 g/dL (ref 30.0–36.0)
MCV: 88.3 fL (ref 80.0–100.0)
Platelets: 312 10*3/uL (ref 150–400)
RBC: 5.13 MIL/uL (ref 4.22–5.81)
RDW: 11.9 % (ref 11.5–15.5)
WBC: 6.7 10*3/uL (ref 4.0–10.5)
nRBC: 0 % (ref 0.0–0.2)

## 2019-08-01 LAB — TROPONIN I (HIGH SENSITIVITY)
Troponin I (High Sensitivity): 4 ng/L (ref <18)
Troponin I (High Sensitivity): 3 ng/L (ref <18)

## 2019-08-01 LAB — D-DIMER, QUANTITATIVE: D-Dimer, Quant: <0.27 ug/mL-FEU (ref 0.00–0.50)

## 2019-08-01 LAB — LIPASE, BLOOD: Lipase: 27 U/L (ref 11–51)

## 2019-08-01 MED ORDER — ONDANSETRON 4 MG PO TBDP: 4.0000 mg | ORAL_TABLET | Freq: Three times a day (TID) | ORAL | 0 refills | Status: DC | PRN

## 2019-08-01 MED ORDER — ALUM & MAG HYDROXIDE-SIMETH 200-200-20 MG/5ML PO SUSP
15.0000 mL | Freq: Once | ORAL | Status: AC
  Administered 2019-08-01: 15 mL via ORAL
  Filled 2019-08-01: qty 30

## 2019-08-01 MED ORDER — SODIUM CHLORIDE 0.9% FLUSH: 3.0000 mL | Freq: Once | INTRAVENOUS | Status: DC

## 2019-08-01 MED ORDER — DICYCLOMINE HCL 20 MG PO TABS: 20.0000 mg | ORAL_TABLET | Freq: Three times a day (TID) | ORAL | 0 refills | Status: DC | PRN

## 2019-08-01 NOTE — Discharge Instructions (Signed)
You were seen in the emergency department today with nausea along with chest and abdominal pain.  Your work-up here in the emergency department is reassuring.  Please continue your home medications and follow closely with your primary care doctor and gastroenterologist.  Please call to schedule the next available appointments with each.  I have called in medication for abdominal cramping type pain along with nausea.  Please take as directed.  Return to the emergency department any new or suddenly worsening symptoms.

## 2019-08-01 NOTE — ED Provider Notes (Signed)
Emergency Department Provider Note   I have reviewed the triage vital signs and the nursing notes.   HISTORY  Chief Complaint Abdominal Pain   HPI Timothy Horn is a 34 y.o. male with PMH of GERD, asthma, and prior COVID 19 infection presents the emergency department for evaluation of burning epigastric pain which worsened significantly overnight with associated chest discomfort.  Patient has had these symptoms ongoing for "a while" but states that they significantly worsen last night.  He saw a gastroenterologist in January and had upper endoscopy.  He has been prescribed Prilosec and Pepcid which he has been taking.  She denies fevers or chills.  He states that he had belching this morning which then led to vomiting without nausea.  There was a small amount of BRB in his vomit without worsening pain or shortness of breath.  He does have some associated sharp, shooting chest pains which hurt more on the left side.  No other modifying factors for the pain. No hemoptysis.   Past Medical History:  Diagnosis Date  . Allergy    seasonal allergies per pt.  . Anxiety   . Asthma   . COVID-19   . GERD (gastroesophageal reflux disease)     Patient Active Problem List   Diagnosis Date Noted  . Atelectasis   . Spontenous pneumothorax of right lung 06/16/2018    History reviewed. No pertinent surgical history.  Allergies Ativan [lorazepam] and Paxil [paroxetine hcl]  Family History  Problem Relation Age of Onset  . Mental illness Sister   . Hyperlipidemia Maternal Grandmother   . Colon cancer Neg Hx   . Esophageal cancer Neg Hx   . Stomach cancer Neg Hx   . Rectal cancer Neg Hx     Social History Social History   Tobacco Use  . Smoking status: Never Smoker  . Smokeless tobacco: Never Used  Substance Use Topics  . Alcohol use: Yes    Alcohol/week: 2.0 standard drinks    Types: 1 Glasses of wine, 1 Shots of liquor per week    Comment: not even 1 a week  . Drug use:  No    Review of Systems  Constitutional: No fever/chills Eyes: No visual changes. ENT: No sore throat. Cardiovascular: Positive chest pain. Respiratory: Denies shortness of breath. Gastrointestinal: Positive abdominal pain. Positive nausea and vomiting.  No diarrhea.  No constipation. Genitourinary: Negative for dysuria. Musculoskeletal: Negative for back pain. Skin: Negative for rash. Neurological: Negative for headaches, focal weakness or numbness.  10-point ROS otherwise negative.  ____________________________________________   PHYSICAL EXAM:  VITAL SIGNS: ED Triage Vitals  Enc Vitals Group     BP 08/01/19 1321 125/79     Pulse Rate 08/01/19 1321 89     Resp 08/01/19 1321 16     Temp 08/01/19 1321 98.9 F (37.2 C)     Temp Source 08/01/19 1321 Oral     SpO2 08/01/19 1321 99 %     Weight 08/01/19 1321 160 lb (72.6 kg)     Height 08/01/19 1321 5\' 9"  (1.753 m)   Constitutional: Alert and oriented. Well appearing and in no acute distress. Eyes: Conjunctivae are normal.  Head: Atraumatic. Nose: No congestion/rhinnorhea. Mouth/Throat: Mucous membranes are moist.  Neck: No stridor.   Cardiovascular: Normal rate, regular rhythm. Good peripheral circulation. Grossly normal heart sounds.   Respiratory: Normal respiratory effort.  No retractions. Lungs CTAB. Gastrointestinal: Soft with mild epigastric tenderness. No rebound or guarding. No RUQ tenderness. Negative Murphy's sign.  No distention.  Musculoskeletal: No gross deformities of extremities. Neurologic:  Normal speech and language.  Skin:  Skin is warm, dry and intact. No rash noted.  ____________________________________________   LABS (all labs ordered are listed, but only abnormal results are displayed)  Labs Reviewed  COMPREHENSIVE METABOLIC PANEL - Abnormal; Notable for the following components:      Result Value   Glucose, Bld 104 (*)    Alkaline Phosphatase 29 (*)    All other components within normal  limits  LIPASE, BLOOD  CBC  URINALYSIS, ROUTINE W REFLEX MICROSCOPIC  D-DIMER, QUANTITATIVE (NOT AT [facility])  TROPONIN I (HIGH SENSITIVITY)  TROPONIN I (HIGH SENSITIVITY)   ____________________________________________  EKG   EKG Interpretation  Date/Time:  Friday August 01 2019 13:27:13 EST Ventricular Rate:  90 PR Interval:  144 QRS Duration: 78 QT Interval:  364 QTC Calculation: 445 R Axis:   77 Text Interpretation: Normal sinus rhythm Nonspecific ST abnormality Abnormal ECG No STEMI Confirmed by Nanda Quinton [id number]) on 08/01/2019 5:00:23 PM       ____________________________________________  RADIOLOGY  DG Chest Portable 1 View  Result Date: 08/01/2019 CLINICAL DATA:  Chest pain EXAM: PORTABLE CHEST 1 VIEW COMPARISON:  07/10/2019 FINDINGS: The heart size and mediastinal contours are within normal limits. Both lungs are clear. The visualized skeletal structures are unremarkable. IMPRESSION: No active disease. Electronically Signed   By: Donavan Foil M.D.   On: 08/01/2019 17:41    ____________________________________________   PROCEDURES  Procedure(s) performed:   Procedures   ____________________________________________   INITIAL IMPRESSION / ASSESSMENT AND PLAN / ED COURSE  Pertinent labs & imaging results that were available during my care of the patient were reviewed by me and considered in my medical decision making (see chart for details).   Patient presents to the emergency department for evaluation of acute on chronic type symptoms.  He is experiencing both abdomen and chest discomfort.  There is a burning quality to his central chest discomfort but he is also describing sharp type pains through his chest.  That will signs are reassuring.  Initial work-up from triage including troponins are normal.  I have added chest x-ray and will send repeat troponin along with D-dimer.  Will give Maalox here.  Patient has been following with gastroenterology which seems  appropriate to continue as I have strong suspicion that the symptoms are GI related. Will need to further evaluate for PE but patient is overall lower risk.   06:15 PM  Patient reassessed.  Additional blood work including D-dimer and repeat troponin are reassuring.  UA negative.  Chest x-ray without acute finding after review.  Plan for close PCP and GI follow-up.  Will prescribe Bentyl and Zofran as needed for symptoms.  Discussed ED return precautions in detail.  Patient is comfortable with the plan at discharge.  ____________________________________________  FINAL CLINICAL IMPRESSION(S) / ED DIAGNOSES  Final diagnoses:  Precordial chest pain  Epigastric pain  Non-intractable vomiting with nausea, unspecified vomiting type     MEDICATIONS GIVEN DURING THIS VISIT:  Medications  sodium chloride flush (NS) 0.9 % injection 3 mL (0 mLs Intravenous Hold 08/01/19 1655)  alum & mag hydroxide-simeth (MAALOX/MYLANTA) 200-200-20 MG/5ML suspension 15 mL (15 mLs Oral Given 08/01/19 1729)     NEW OUTPATIENT MEDICATIONS STARTED DURING THIS VISIT:  New Prescriptions   DICYCLOMINE (BENTYL) 20 MG TABLET    Take 1 tablet (20 mg total) by mouth 3 (three) times daily as needed for spasms (abdominal craming).  ONDANSETRON (ZOFRAN ODT) 4 MG DISINTEGRATING TABLET    Take 1 tablet (4 mg total) by mouth every 8 (eight) hours as needed for nausea or vomiting.    Note:  This document was prepared using Dragon voice recognition software and may include unintentional dictation errors.  Alona Bene, MD, [facility]    Shatisha Falter, Arlyss Repress, MD 08/01/19 1816

## 2019-08-01 NOTE — ED Triage Notes (Signed)
C/O abdominal pain since last night and vomited some blood. Pt stated he now feels dizzy along w/ chest pain and tightness. Patient endorsed hx of acid reflux issue.

## 2019-08-14 ENCOUNTER — Ambulatory Visit: Payer: Medicaid Other | Admitting: Gastroenterology

## 2019-09-22 ENCOUNTER — Encounter: Payer: Self-pay | Admitting: Allergy and Immunology

## 2019-09-22 ENCOUNTER — Ambulatory Visit (INDEPENDENT_AMBULATORY_CARE_PROVIDER_SITE_OTHER): Payer: Self-pay | Admitting: Allergy and Immunology

## 2019-09-22 ENCOUNTER — Other Ambulatory Visit: Payer: Self-pay

## 2019-09-22 VITALS — BP 118/80 | HR 99 | Temp 97.8°F | Resp 16 | Ht 69.0 in | Wt 160.2 lb

## 2019-09-22 DIAGNOSIS — K219 Gastro-esophageal reflux disease without esophagitis: Secondary | ICD-10-CM

## 2019-09-22 DIAGNOSIS — H1010 Acute atopic conjunctivitis, unspecified eye: Secondary | ICD-10-CM | POA: Insufficient documentation

## 2019-09-22 DIAGNOSIS — J3089 Other allergic rhinitis: Secondary | ICD-10-CM

## 2019-09-22 DIAGNOSIS — J4520 Mild intermittent asthma, uncomplicated: Secondary | ICD-10-CM | POA: Insufficient documentation

## 2019-09-22 DIAGNOSIS — J45990 Exercise induced bronchospasm: Secondary | ICD-10-CM

## 2019-09-22 DIAGNOSIS — H1013 Acute atopic conjunctivitis, bilateral: Secondary | ICD-10-CM

## 2019-09-22 MED ORDER — AZELASTINE HCL 0.1 % NA SOLN: 1.0000 | Freq: Two times a day (BID) | NASAL | 5 refills | Status: AC | PRN

## 2019-09-22 MED ORDER — ALBUTEROL SULFATE HFA 108 (90 BASE) MCG/ACT IN AERS: 2.0000 | INHALATION_SPRAY | Freq: Four times a day (QID) | RESPIRATORY_TRACT | 1 refills | Status: DC | PRN

## 2019-09-22 NOTE — Progress Notes (Signed)
New Patient Note  RE: Timothy Horn MRN: [id number] DOB: [birth year] Date of Office Visit: 09/22/2019  Referring provider: No ref. provider found Primary care provider: Grayce Sessions, NP  Chief Complaint: Allergic Rhinitis    History of present illness: Timothy Horn is a 34 y.o. male presenting today for evaluation of rhinoconjunctivitis.[name] He complains of nasal congestion, rhinorrhea, sneezing, postnasal drainage, throat clearing, and throat irritation.  These symptoms occur year-round but are most frequent and severe during the springtime and in the fall.  He has been attempting to control the symptoms with cetirizine and fluticasone nasal spray, however he is still forced to spend a great deal of his time indoors to avoid pollen exposure. He has had exercise-induced bronchospasm since childhood.  He reports that he experiences chest tightness with exercise and also when his acid reflux is uncontrolled.  Assessment and plan: Seasonal and perennial allergic rhinitis  Aeroallergen avoidance measures have been discussed and provided in written form.  Fexofenadine (Allegra) 180 mg daily as needed.  To avoid diminishing benefit with daily use (tachyphylaxis) of second generation antihistamine, consider alternating every few months between fexofenadine (Allegra) and levocetirizine (Xyzal).  A prescription has been provided for azelastine nasal spray, 1-2 sprays per nostril 2 times daily as needed. Proper nasal spray technique has been discussed and demonstrated.   If needed, may use fluticasone nasal spray (Flonase) in conjunction with the azelastine nasal spray.  Nasal saline spray (i.e., Simply Saline) or nasal saline lavage (i.e., NeilMed) is recommended as needed and prior to medicated nasal sprays.  If allergen avoidance measures and medications fail to adequately relieve symptoms, aeroallergen immunotherapy will be considered.  Allergic conjunctivitis  Treatment  plan as outlined above for allergic rhinitis.  I have recommended Pataday extra strength, one drop per eye daily as needed.  I have also recommended eye lubricant drops (i.e., Natural Tears) as needed.  Exercise-induced bronchospasm  A refill prescription has been provided for albuterol HFA, 1 to 2 inhalations every 4-6 hours if needed and 15 minutes prior to exercise.  Subjective and objective measures of pulmonary function will be followed and the treatment plan will be adjusted accordingly.  GERD (gastroesophageal reflux disease)  Continue appropriate reflux lifestyle modifications.  Continue with recommendations as per gastroenterology, and follow-up with your gastroenterologist as recommended.   Meds ordered this encounter  Medications  . albuterol (VENTOLIN HFA) 108 (90 Base) MCG/ACT inhaler    Sig: Inhale 2 puffs into the lungs every 6 (six) hours as needed for wheezing or shortness of breath.    Dispense:  8 g    Refill:  1  . azelastine (ASTELIN) 0.1 % nasal spray    Sig: Place 1-2 sprays into both nostrils 2 (two) times daily as needed for rhinitis.    Dispense:  30 mL    Refill:  5    Diagnostics: Spirometry: Spirometry reveals an FVC of 4.13 L and an FEV1 of 3.08 L (86% predicted) with significant postbronchodilator improvement.  This study was performed while the patient was asymptomatic.  Please see scanned spirometry results for details. Epicutaneous testing: Robust reactivity to grass pollen and tree pollen.  Positive to weed pollen. Intradermal testing: Positive to cat hair and dust mite antigen.  Physical examination: Blood pressure 118/80, pulse 99, temperature 97.8 F (36.6 C), temperature source Temporal, resp. rate 16, height 5\' 9"  (1.753 m), weight 160 lb 3.2 oz (72.7 kg), SpO2 99 %.  General: Alert, interactive, in no acute distress. HEENT: TMs  pearly gray, turbinates moderately edematous without discharge, post-pharynx moderately erythematous. Neck:  Supple without lymphadenopathy. Lungs: Clear to auscultation without wheezing, rhonchi or rales. CV: Normal S1, S2 without murmurs. Abdomen: Nondistended, nontender. Skin: Warm and dry, without lesions or rashes. Extremities:  No clubbing, cyanosis or edema. Neuro:   Grossly intact.  Review of systems:  Review of systems negative except as noted in HPI / PMHx or noted below: Review of Systems  Constitutional: Negative.   HENT: Negative.   Eyes: Negative.   Respiratory: Negative.   Cardiovascular: Negative.   Gastrointestinal: Negative.   Genitourinary: Negative.   Musculoskeletal: Negative.   Skin: Negative.   Neurological: Negative.   Endo/Heme/Allergies: Negative.   Psychiatric/Behavioral: Negative.     Past medical history:  Past Medical History:  Diagnosis Date  . Allergy    seasonal allergies per pt.  . Anxiety   . Asthma   . COVID-19   . GERD (gastroesophageal reflux disease)     Past surgical history:  History reviewed. No pertinent surgical history.  Family history: Family History  Problem Relation Age of Onset  . Mental illness Sister   . Hyperlipidemia Maternal Grandmother   . Colon cancer Neg Hx   . Esophageal cancer Neg Hx   . Stomach cancer Neg Hx   . Rectal cancer Neg Hx     Social history: Social History   Socioeconomic History  . Marital status: Married    Spouse name: Clinical cytogeneticist  . Number of children: 1  . Years of education: Not on file  . Highest education level: Not on file  Occupational History  . Not on file  Tobacco Use  . Smoking status: Never Smoker  . Smokeless tobacco: Never Used  Substance and Sexual Activity  . Alcohol use: Not Currently  . Drug use: No  . Sexual activity: Yes    Partners: Female  Other Topics Concern  . Not on file  Social History Narrative  . Not on file   Social Determinants of Health   Financial Resource Strain:   . Difficulty of Paying Living Expenses:   Food Insecurity:   . Worried  About Charity fundraiser in the Last Year:   . Arboriculturist in the Last Year:   Transportation Needs:   . Film/video editor (Medical):   [name] Lack of Transportation (Non-Medical):   Physical Activity:   . Days of Exercise per Week:   . Minutes of Exercise per Session:   Stress:   . Feeling of Stress :   Social Connections:   . Frequency of Communication with Friends and Family:   . Frequency of Social Gatherings with Friends and Family:   . Attends Religious Services:   . Active Member of Clubs or Organizations:   . Attends Archivist Meetings:   [name] Marital Status:   Intimate Partner Violence:   . Fear of Current or Ex-Partner:   . Emotionally Abused:   [name] Physically Abused:   . Sexually Abused:     Environmental History: The patient lives in a 35 year old house with carpeting in the bedroom, gassy, and central air.  There is a dog and cat in the home which do not have access to his bedroom.  There is mold/water damage in the home.  He is a non-smoker.  Current Outpatient Medications  Medication Sig Dispense Refill  . albuterol (VENTOLIN HFA) 108 (90 Base) MCG/ACT inhaler Inhale 2 puffs into the lungs every 6 (six) hours  as needed for wheezing or shortness of breath. 8 g 1  . cetirizine (ZYRTEC) 10 MG tablet Take 10 mg by mouth daily.    [name] acetaminophen (TYLENOL) 500 MG tablet Take 500 mg by mouth every 6 (six) hours as needed for headache (pain).    [name] azelastine (ASTELIN) 0.1 % nasal spray Place 1-2 sprays into both nostrils 2 (two) times daily as needed for rhinitis. 30 mL 5  . calcium carbonate (TUMS EX) 750 MG chewable tablet Chew 1-2 tablets by mouth 2 (two) times daily as needed for heartburn.     . dicyclomine (BENTYL) 20 MG tablet Take 1 tablet (20 mg total) by mouth 3 (three) times daily as needed for spasms (abdominal craming). 20 tablet 0  . famotidine (PEPCID) 20 MG tablet Take 1 tablet (20 mg total) by mouth 2 (two) times daily for 14 days. (Patient taking  differently: Take 20 mg by mouth daily. ) 28 tablet 0  . hydrOXYzine (ATARAX/VISTARIL) 25 MG tablet Take 1 tablet (25 mg total) by mouth every 6 (six) hours as needed for anxiety. (Patient not taking: Reported on 07/18/2019) 20 tablet 0  . ondansetron (ZOFRAN ODT) 4 MG disintegrating tablet Take 1 tablet (4 mg total) by mouth every 8 (eight) hours as needed for nausea or vomiting. 20 tablet 0   No current facility-administered medications for this visit.    Known medication allergies: Allergies  Allergen Reactions  . Ativan [Lorazepam] Anxiety and Other (See Comments)    Loss of appetite, shaky   . Paxil [Paroxetine Hcl] Anxiety and Other (See Comments)    Dizziness, insomnia    I appreciate the opportunity to take part in Nathaniel's care. Please do not hesitate to contact me with questions.  Sincerely,   R. Jorene Guest, MD

## 2019-09-22 NOTE — Patient Instructions (Addendum)
Seasonal and perennial allergic rhinitis  Aeroallergen avoidance measures have been discussed and provided in written form.  Fexofenadine (Allegra) 180 mg daily as needed.  To avoid diminishing benefit with daily use (tachyphylaxis) of second generation antihistamine, consider alternating every few months between fexofenadine (Allegra) and levocetirizine (Xyzal).  A prescription has been provided for azelastine nasal spray, 1-2 sprays per nostril 2 times daily as needed. Proper nasal spray technique has been discussed and demonstrated.   If needed, may use fluticasone nasal spray (Flonase) in conjunction with the azelastine nasal spray.  Nasal saline spray (i.e., Simply Saline) or nasal saline lavage (i.e., NeilMed) is recommended as needed and prior to medicated nasal sprays.  If allergen avoidance measures and medications fail to adequately relieve symptoms, aeroallergen immunotherapy will be considered.  Allergic conjunctivitis  Treatment plan as outlined above for allergic rhinitis.  I have recommended Pataday extra strength, one drop per eye daily as needed.  I have also recommended eye lubricant drops (i.e., Natural Tears) as needed.  Exercise-induced bronchospasm  A refill prescription has been provided for albuterol HFA, 1 to 2 inhalations every 4-6 hours if needed and 15 minutes prior to exercise.  Subjective and objective measures of pulmonary function will be followed and the treatment plan will be adjusted accordingly.  GERD (gastroesophageal reflux disease)  Continue appropriate reflux lifestyle modifications.  Continue with recommendations as per gastroenterology, and follow-up with your gastroenterologist as recommended.   Return in about 4 months (around 01/23/2020), or if symptoms worsen or fail to improve.  Control of Dust Mite Allergen  House dust mites play a major role in allergic asthma and rhinitis.  They occur in environments with high humidity wherever  human skin, the food for dust mites is found. High levels have been detected in dust obtained from mattresses, pillows, carpets, upholstered furniture, bed covers, clothes and soft toys.  The principal allergen of the house dust mite is found in its feces.  A gram of dust may contain 1,000 mites and 250,000 fecal particles.  Mite antigen is easily measured in the air during house cleaning activities.    1. Encase mattresses, including the box spring, and pillow, in an air tight cover.  Seal the zipper end of the encased mattresses with wide adhesive tape. 2. Wash the bedding in water of 130 degrees Farenheit weekly.  Avoid cotton comforters/quilts and flannel bedding: the most ideal bed covering is the dacron comforter. 3. Remove all upholstered furniture from the bedroom. 4. Remove carpets, carpet padding, rugs, and non-washable window drapes from the bedroom.  Wash drapes weekly or use plastic window coverings. 5. Remove all non-washable stuffed toys from the bedroom.  Wash stuffed toys weekly. 6. Have the room cleaned frequently with a vacuum cleaner and a damp dust-mop.  The patient should not be in a room which is being cleaned and should wait 1 hour after cleaning before going into the room. 7. Close and seal all heating outlets in the bedroom.  Otherwise, the room will become filled with dust-laden air.  An electric heater can be used to heat the room. Reduce indoor humidity to less than 50%.  Do not use a humidifier.   Reducing Pollen Exposure  The American Academy of Allergy, Asthma and Immunology suggests the following steps to reduce your exposure to pollen during allergy seasons.    1. Do not hang sheets or clothing out to dry; pollen may collect on these items. 2. Do not mow lawns or spend time around freshly  cut grass; mowing stirs up pollen. 3. Keep windows closed at night.  Keep car windows closed while driving. 4. Minimize morning activities outdoors, a time when pollen counts  are usually at their highest. 5. Stay indoors as much as possible when pollen counts or humidity is high and on windy days when pollen tends to remain in the air longer. 6. Use air conditioning when possible.  Many air conditioners have filters that trap the pollen spores. 7. Use a HEPA room air filter to remove pollen form the indoor air you breathe.   Control of Dog or Cat Allergen  Avoidance is the best way to manage a dog or cat allergy. If you have a dog or cat and are allergic to dog or cats, consider removing the dog or cat from the home. If you have a dog or cat but don't want to find it a new home, or if your family wants a pet even though someone in the household is allergic, here are some strategies that may help keep symptoms at bay:  1. Keep the pet out of your bedroom and restrict it to only a few rooms. Be advised that keeping the dog or cat in only one room will not limit the allergens to that room. 2. Don't pet, hug or kiss the dog or cat; if you do, wash your hands with soap and water. 3. High-efficiency particulate air (HEPA) cleaners run continuously in a bedroom or living room can reduce allergen levels over time. 4. Place electrostatic material sheet in the air inlet vent in the bedroom. 5. Regular use of a high-efficiency vacuum cleaner or a central vacuum can reduce allergen levels. 6. Giving your dog or cat a bath at least once a week can reduce airborne allergen.   Control of Mold Allergen  Mold and fungi can grow on a variety of surfaces provided certain temperature and moisture conditions exist.  Outdoor molds grow on plants, decaying vegetation and soil.  The major outdoor mold, Alternaria and Cladosporium, are found in very high numbers during hot and dry conditions.  Generally, a late Summer - Fall peak is seen for common outdoor fungal spores.  Rain will temporarily lower outdoor mold spore count, but counts rise rapidly when the rainy period ends.  The most  important indoor molds are Aspergillus and Penicillium.  Dark, humid and poorly ventilated basements are ideal sites for mold growth.  The next most common sites of mold growth are the bathroom and the kitchen.  Outdoor [employer] 1. Use air conditioning and keep windows closed 2. Avoid exposure to decaying vegetation. 3. Avoid leaf raking. 4. Avoid grain handling. 5. Consider wearing a face mask if working in moldy areas.  Indoor Mold Control 1. Maintain humidity below 50%. 2. Clean washable surfaces with 5% bleach solution. 3. Remove sources e.g. Contaminated carpets.

## 2019-09-22 NOTE — Assessment & Plan Note (Signed)
   A refill prescription has been provided for albuterol HFA, 1 to 2 inhalations every 4-6 hours if needed and 15 minutes prior to exercise.  Subjective and objective measures of pulmonary function will be followed and the treatment plan will be adjusted accordingly. 

## 2019-09-22 NOTE — Assessment & Plan Note (Signed)
   Aeroallergen avoidance measures have been discussed and provided in written form.  Fexofenadine (Allegra) 180 mg daily as needed.  To avoid diminishing benefit with daily use (tachyphylaxis) of second generation antihistamine, consider alternating every few months between fexofenadine (Allegra) and levocetirizine (Xyzal).  A prescription has been provided for azelastine nasal spray, 1-2 sprays per nostril 2 times daily as needed. Proper nasal spray technique has been discussed and demonstrated.   If needed, may use fluticasone nasal spray (Flonase) in conjunction with the azelastine nasal spray.  Nasal saline spray (i.e., Simply Saline) or nasal saline lavage (i.e., NeilMed) is recommended as needed and prior to medicated nasal sprays.  If allergen avoidance measures and medications fail to adequately relieve symptoms, aeroallergen immunotherapy will be considered.

## 2019-09-22 NOTE — Assessment & Plan Note (Signed)
   Continue appropriate reflux lifestyle modifications.  Continue with recommendations as per gastroenterology, and follow-up with your gastroenterologist as recommended.

## 2019-09-22 NOTE — Assessment & Plan Note (Signed)
   Treatment plan as outlined above for allergic rhinitis.  I have recommended Pataday extra strength, one drop per eye daily as needed.  I have also recommended eye lubricant drops (i.e., Natural Tears) as needed.

## 2019-09-23 ENCOUNTER — Telehealth: Payer: Self-pay | Admitting: Allergy & Immunology

## 2019-09-23 MED ORDER — TRIAMCINOLONE ACETONIDE 0.1 % EX OINT: 1.0000 "application " | TOPICAL_OINTMENT | Freq: Two times a day (BID) | CUTANEOUS | 0 refills | Status: AC

## 2019-09-23 NOTE — Telephone Encounter (Signed)
Patient called reporting increased itching and swelling his skin testing pricks and intradermals from his testing yesterday. He left a voicemail and I called back without an answer. Mailbox was full, therefore I texted him to let him know that I sent in triamcinolone ointment to use TID for one week to the affected areas. Ne reported systemic symptoms including SOB, throat swelling, or other concerns.   Malachi Bonds, MD [facility]

## 2019-09-24 NOTE — Telephone Encounter (Signed)
Called and spoke with the patient. He stated that he was still having some swelling and itching at the testing site. Advised that you sent in Triamcinolone for him and advised for how to use the ointment. Patient verbalized understanding.

## 2019-09-25 ENCOUNTER — Emergency Department (HOSPITAL_COMMUNITY): Payer: Medicaid Other

## 2019-09-25 ENCOUNTER — Encounter (HOSPITAL_COMMUNITY): Payer: Self-pay | Admitting: Emergency Medicine

## 2019-09-25 ENCOUNTER — Emergency Department (HOSPITAL_COMMUNITY)
Admission: EM | Admit: 2019-09-25 | Discharge: 2019-09-25 | Disposition: A | Discharge status: Home / Self Care | Payer: Medicaid Other | Attending: Emergency Medicine | Admitting: Emergency Medicine

## 2019-09-25 DIAGNOSIS — Z79899 Other long term (current) drug therapy: Secondary | ICD-10-CM | POA: Insufficient documentation

## 2019-09-25 DIAGNOSIS — Z8616 Personal history of COVID-19: Secondary | ICD-10-CM | POA: Insufficient documentation

## 2019-09-25 DIAGNOSIS — R0789 Other chest pain: Secondary | ICD-10-CM | POA: Insufficient documentation

## 2019-09-25 DIAGNOSIS — R079 Chest pain, unspecified: Secondary | ICD-10-CM

## 2019-09-25 LAB — BASIC METABOLIC PANEL
Anion gap: 10 (ref 5–15)
BUN: 11 mg/dL (ref 6–20)
CO2: 28 mmol/L (ref 22–32)
Calcium: 9.5 mg/dL (ref 8.9–10.3)
Chloride: 102 mmol/L (ref 98–111)
Creatinine, Ser: 1 mg/dL (ref 0.61–1.24)
GFR calc Af Amer: >60 mL/min (ref >60)
GFR calc non Af Amer: >60 mL/min (ref >60)
Glucose, Bld: 84 mg/dL (ref 70–99)
Potassium: 3.9 mmol/L (ref 3.5–5.1)
Sodium: 140 mmol/L (ref 135–145)

## 2019-09-25 LAB — CBC
HCT: 43.6 % (ref 39.0–52.0)
Hemoglobin: 15 g/dL (ref 13.0–17.0)
MCH: 30 pg (ref 26.0–34.0)
MCHC: 34.4 g/dL (ref 30.0–36.0)
MCV: 87.2 fL (ref 80.0–100.0)
Platelets: 252 10*3/uL (ref 150–400)
RBC: 5 MIL/uL (ref 4.22–5.81)
RDW: 11.6 % (ref 11.5–15.5)
WBC: 5 10*3/uL (ref 4.0–10.5)
nRBC: 0 % (ref 0.0–0.2)

## 2019-09-25 LAB — TROPONIN I (HIGH SENSITIVITY)
Troponin I (High Sensitivity): 3 ng/L (ref <18)
Troponin I (High Sensitivity): <2 ng/L (ref <18)

## 2019-09-25 LAB — D-DIMER, QUANTITATIVE: D-Dimer, Quant: <0.27 ug/mL-FEU (ref 0.00–0.50)

## 2019-09-25 MED ORDER — SODIUM CHLORIDE 0.9% FLUSH: 3.0000 mL | Freq: Once | INTRAVENOUS | Status: DC

## 2019-09-25 NOTE — ED Provider Notes (Signed)
[facility] EMERGENCY DEPARTMENT Provider Note   CSN: [id number] Arrival date & time: 09/25/19  1237     History Chief Complaint  Patient presents with  . Chest Pain     Timothy Horn is a 34 y.o. male.  The history is provided by the patient.  Chest Pain Pain location:  L chest Pain quality: aching   Pain radiates to:  Does not radiate Pain severity:  Mild Onset quality:  Gradual Timing:  Constant Progression:  Unchanged Chronicity:  New Context: at rest   Relieved by:  Nothing Worsened by:  Nothing Associated symptoms: no abdominal pain, no AICD problem, no anorexia, no anxiety, no back pain, no claudication, no cough, no fever, no headache, no nausea, no palpitations, no shortness of breath and no vomiting   Risk factors: no coronary artery disease, no high cholesterol and no hypertension   Risk factors comment:  COVID in january      Past Medical History:  Diagnosis Date  . Allergy    seasonal allergies per pt.  . Anxiety   . Asthma   . COVID-19   . GERD (gastroesophageal reflux disease)     Patient Active Problem List   Diagnosis Date Noted  . Seasonal and perennial allergic rhinitis 09/22/2019  . Allergic conjunctivitis 09/22/2019  . Exercise-induced bronchospasm 09/22/2019  . GERD (gastroesophageal reflux disease) 09/22/2019  . Atelectasis   . Spontenous pneumothorax of right lung 06/16/2018    History reviewed. No pertinent surgical history.     Family History  Problem Relation Age of Onset  . Mental illness Sister   . Hyperlipidemia Maternal Grandmother   . Colon cancer Neg Hx   . Esophageal cancer Neg Hx   . Stomach cancer Neg Hx   . Rectal cancer Neg Hx     Social History   Tobacco Use  . Smoking status: Never Smoker  . Smokeless tobacco: Never Used  Substance Use Topics  . Alcohol use: Not Currently  . Drug use: No    Home Medications Prior to Admission medications   Medication Sig Start Date End Date  Taking? Authorizing Provider  acetaminophen (TYLENOL) 500 MG tablet Take 500 mg by mouth every 6 (six) hours as needed for headache (pain).    [provider]  albuterol (VENTOLIN HFA) 108 (90 Base) MCG/ACT inhaler Inhale 2 puffs into the lungs every 6 (six) hours as needed for wheezing or shortness of breath. 09/22/19   Bobbitt, Heywood Iles, MD  azelastine (ASTELIN) 0.1 % nasal spray Place 1-2 sprays into both nostrils 2 (two) times daily as needed for rhinitis. 09/22/19   Bobbitt, Heywood Iles, MD  calcium carbonate (TUMS EX) 750 MG chewable tablet Chew 1-2 tablets by mouth 2 (two) times daily as needed for heartburn.     [provider]  cetirizine (ZYRTEC) 10 MG tablet Take 10 mg by mouth daily.    [provider]  dicyclomine (BENTYL) 20 MG tablet Take 1 tablet (20 mg total) by mouth 3 (three) times daily as needed for spasms (abdominal craming). 08/01/19   Long, Arlyss Repress, MD  famotidine (PEPCID) 20 MG tablet Take 1 tablet (20 mg total) by mouth 2 (two) times daily for 14 days. Patient taking differently: Take 20 mg by mouth daily.  06/10/19 06/27/28  Couture, Cortni S, PA-C  hydrOXYzine (ATARAX/VISTARIL) 25 MG tablet Take 1 tablet (25 mg total) by mouth every 6 (six) hours as needed for anxiety. Patient not taking: Reported on 07/18/2019 06/25/19  Harris, Abigail, PA-C  ondansetron (ZOFRAN ODT) 4 MG disintegrating tablet Take 1 tablet (4 mg total) by mouth every 8 (eight) hours as needed for nausea or vomiting. 08/01/19   Long, Arlyss Repress, MD  triamcinolone ointment (KENALOG) 0.1 % Apply 1 application topically 2 (two) times daily. 09/23/19   Alfonse Spruce, MD    Allergies    Ativan [lorazepam] and Paxil [paroxetine hcl]  Review of Systems   Review of Systems  Constitutional: Negative for chills and fever.  HENT: Negative for ear pain and sore throat.   Eyes: Negative for pain and visual disturbance.  Respiratory: Negative for cough and shortness of breath.     Cardiovascular: Positive for chest pain. Negative for palpitations and claudication.  Gastrointestinal: Negative for abdominal pain, anorexia, nausea and vomiting.  Genitourinary: Negative for dysuria and hematuria.  Musculoskeletal: Negative for arthralgias and back pain.  Skin: Negative for color change and rash.  Neurological: Negative for seizures, syncope and headaches.  All other systems reviewed and are negative.   Physical Exam Updated Vital Signs  ED Triage Vitals [09/25/19 1240]  Enc Vitals Group     BP 127/86     Pulse Rate 91     Resp 17     Temp 97.9 F (36.6 C)     Temp Source Oral     SpO2 100 %     Weight      Height      Head Circumference      Peak Flow      Pain Score      Pain Loc      Pain Edu?      Excl. in GC?     Physical Exam Vitals and nursing note reviewed.  Constitutional:      General: He is not in acute distress.    Appearance: He is well-developed. He is not ill-appearing.  HENT:     Head: Normocephalic and atraumatic.  Eyes:     Conjunctiva/sclera: Conjunctivae normal.     Pupils: Pupils are equal, round, and reactive to light.  Cardiovascular:     Rate and Rhythm: Normal rate and regular rhythm.     Pulses:          Radial pulses are 2+ on the right side and 2+ on the left side.       Dorsalis pedis pulses are 2+ on the right side and 2+ on the left side.     Heart sounds: Normal heart sounds. No murmur.  Pulmonary:     Effort: Pulmonary effort is normal. No respiratory distress.     Breath sounds: Normal breath sounds. No decreased breath sounds, wheezing or rhonchi.  Abdominal:     Palpations: Abdomen is soft.     Tenderness: There is no abdominal tenderness.  Musculoskeletal:        General: Normal range of motion.     Cervical back: Normal range of motion and neck supple.     Right lower leg: No edema.     Left lower leg: No edema.  Skin:    General: Skin is warm and dry.     Capillary Refill: Capillary refill takes  less than 2 seconds.  Neurological:     General: No focal deficit present.     Mental Status: He is alert.     ED Results / Procedures / Treatments   Labs (all labs ordered are listed, but only abnormal results are displayed) Labs Reviewed  BASIC METABOLIC  PANEL  CBC  D-DIMER, QUANTITATIVE (NOT AT [facility])  TROPONIN I (HIGH SENSITIVITY)  TROPONIN I (HIGH SENSITIVITY)    EKG EKG Interpretation  Date/Time:  Thursday Sep 25 2019 12:40:07 EDT Ventricular Rate:  95 PR Interval:  150 QRS Duration: 78 QT Interval:  340 QTC Calculation: 427 R Axis:   73 Text Interpretation: Normal sinus rhythm Normal ECG Confirmed by Lennice Sites [phone]) on 09/25/2019 5:08:14 PM   Radiology DG Chest 2 View  Result Date: 09/25/2019 CLINICAL DATA:  Left-sided chest pain EXAM: CHEST - 2 VIEW COMPARISON:  08/01/2019 FINDINGS: The heart size and mediastinal contours are within normal limits. Both lungs are clear. The visualized skeletal structures are unremarkable. IMPRESSION: No acute abnormality of the lungs. Electronically Signed   By: Eddie Candle M.D.   On: 09/25/2019 13:07    Procedures Procedures (including critical care time)  Medications Ordered in ED Medications  sodium chloride flush (NS) 0.9 % injection 3 mL (has no administration in time range)    ED Course  I have reviewed the triage vital signs and the nursing notes.  Pertinent labs & imaging results that were available during my care of the patient were reviewed by me and considered in my medical decision making (see chart for details).    MDM Rules/Calculators/A&P                      Timothy Horn is a 34 year old male with history of asthma, anxiety, recent COVID-19 infection who presents the ED with chest pain.  Patient with normal vitals.  No fever.  Pleuritic type pain.  No obvious wheezing or respiratory distress on exam.  EKG shows sinus rhythm.  No ischemic changes.  No cardiac risk factors.  Troponin negative x2.   Doubt ACS.  Will get D-dimer to further evaluate for PE especially given pleuritic nature of pain and history of Covid.  Chest x-ray thus far showed no signs of infection, no pneumothorax, no pleural effusion.  D-dimer negative.  Doubt PE.  Likely GI or muscular in nature.  Recommend follow-up with primary care doctor.  Discharged in good condition.  This chart was dictated using voice recognition software.  Despite best efforts to proofread,  errors can occur which can change the documentation meaning.    Final Clinical Impression(s) / ED Diagnoses Final diagnoses:  Nonspecific chest pain    Rx / DC Orders ED Discharge Orders    None       Lennice Sites, DO 09/25/19 1936

## 2019-09-25 NOTE — ED Triage Notes (Addendum)
Pt reports L sided chest pain x3 days. Also endorses feeling lightheaded and dizzy today. Endorses vomiting this morning. Denies cough, sob or fever. A/ox4, speech clear, no neuro defs.

## 2019-12-07 IMAGING — DX DG CHEST 1V PORT
1 series · 1 of 1 positions shown · non-contrast
Comparison: 06/28/2018

CLINICAL DATA: Chest tube rt side

EXAM:
PORTABLE CHEST 1 VIEW

[chest ap]
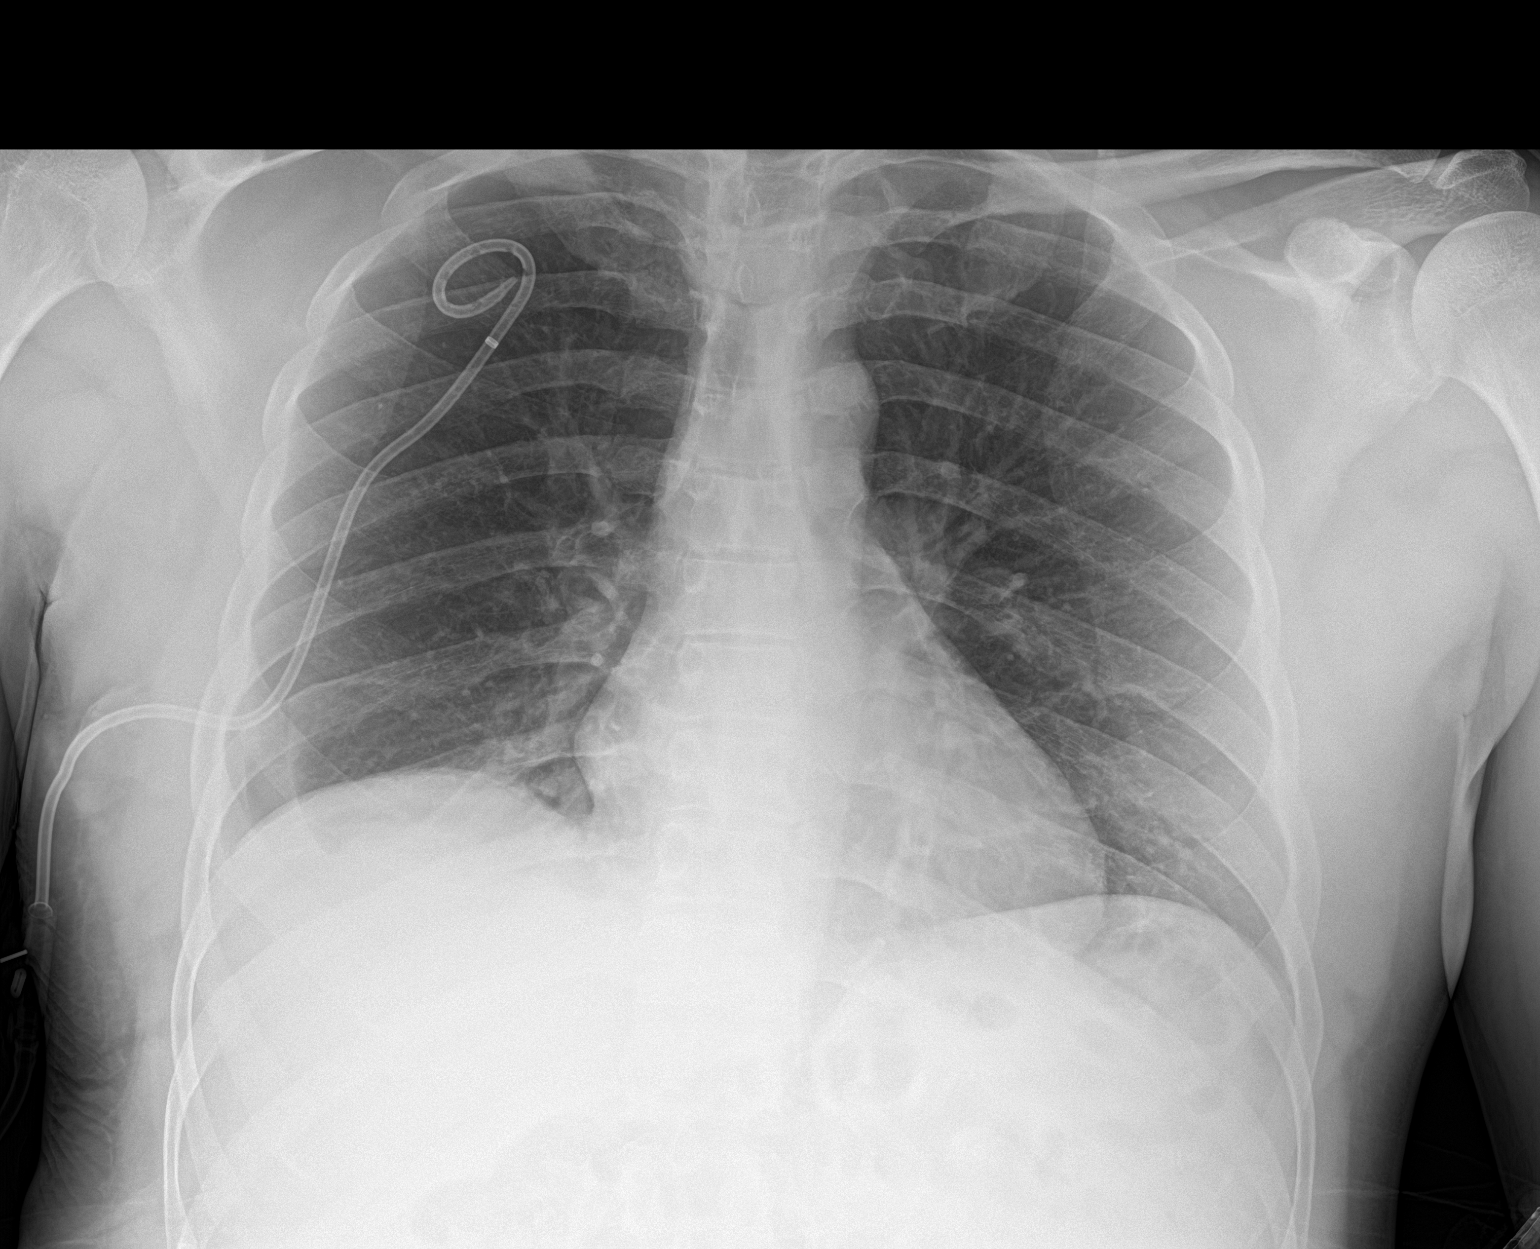

[1 of 1 positions shown; findings below may reference images not displayed]

FINDINGS: A RIGHT-sided pleural catheter is unchanged in position. Small RIGHT
basilar pneumothorax persists and appears slightly smaller. There is
less pleural effusion on the RIGHT compared to prior studies. The
RIGHT lung is otherwise clear. LEFT lung is clear with only minimal
atelectasis in the MEDIAL lung base. Heart size is normal.
IMPRESSION: Smaller RIGHT pneumothorax.

## 2020-01-27 ENCOUNTER — Ambulatory Visit: Payer: Self-pay | Admitting: Allergy and Immunology

## 2020-04-22 ENCOUNTER — Other Ambulatory Visit: Payer: Self-pay

## 2020-04-22 ENCOUNTER — Emergency Department (HOSPITAL_COMMUNITY)
Admission: EM | Admit: 2020-04-22 | Discharge: 2020-04-23 | Disposition: A | Discharge status: Home / Self Care | Payer: Medicaid Other | Attending: Emergency Medicine | Admitting: Emergency Medicine

## 2020-04-22 ENCOUNTER — Encounter (HOSPITAL_COMMUNITY): Payer: Self-pay | Admitting: Emergency Medicine

## 2020-04-22 ENCOUNTER — Emergency Department (HOSPITAL_COMMUNITY): Payer: Medicaid Other

## 2020-04-22 DIAGNOSIS — R079 Chest pain, unspecified: Secondary | ICD-10-CM | POA: Insufficient documentation

## 2020-04-22 DIAGNOSIS — J45909 Unspecified asthma, uncomplicated: Secondary | ICD-10-CM | POA: Insufficient documentation

## 2020-04-22 DIAGNOSIS — R0789 Other chest pain: Secondary | ICD-10-CM

## 2020-04-22 DIAGNOSIS — Z8616 Personal history of COVID-19: Secondary | ICD-10-CM | POA: Insufficient documentation

## 2020-04-22 LAB — BASIC METABOLIC PANEL
Anion gap: 12 (ref 5–15)
BUN: 10 mg/dL (ref 6–20)
CO2: 26 mmol/L (ref 22–32)
Calcium: 9.5 mg/dL (ref 8.9–10.3)
Chloride: 101 mmol/L (ref 98–111)
Creatinine, Ser: 1.02 mg/dL (ref 0.61–1.24)
GFR, Estimated: >60 mL/min (ref >60)
Glucose, Bld: 88 mg/dL (ref 70–99)
Potassium: 3.7 mmol/L (ref 3.5–5.1)
Sodium: 139 mmol/L (ref 135–145)

## 2020-04-22 LAB — CBC
HCT: 46.2 % (ref 39.0–52.0)
Hemoglobin: 16.4 g/dL (ref 13.0–17.0)
MCH: 30.5 pg (ref 26.0–34.0)
MCHC: 35.5 g/dL (ref 30.0–36.0)
MCV: 86 fL (ref 80.0–100.0)
Platelets: 264 10*3/uL (ref 150–400)
RBC: 5.37 MIL/uL (ref 4.22–5.81)
RDW: 11.5 % (ref 11.5–15.5)
WBC: 4.6 10*3/uL (ref 4.0–10.5)
nRBC: 0 % (ref 0.0–0.2)

## 2020-04-22 LAB — TROPONIN I (HIGH SENSITIVITY)
Troponin I (High Sensitivity): <2 ng/L (ref <18)
Troponin I (High Sensitivity): <2 ng/L (ref <18)

## 2020-04-22 NOTE — ED Provider Notes (Signed)
[facility] EMERGENCY DEPARTMENT Provider Note   CSN: [id number] Arrival date & time: 04/22/20  1345     History Chief Complaint  Patient presents with  . Chest Pain    Timothy Horn is a 34 y.o. male with PMHx anxiety, asthma, COVID in Jan of this year, GERD who presents to the ED today with complaint of gradual onset, intermittent, sharp, left sided chest pain x 1 week. Pt reports that the pain was worse today prompting him to come to the ED. He states that when he first had the pain he did have an episode of emesis however has not had any since. He has a history of GERD however states this feels different. He denies any chest trauma. No recent heavy lifting. Pt does mention he recently travelled to [place] last month via car; no history of DVT/PE. No exogenous hormone use. No active malignancy. No hemoptysis. He also had COVID in January of this year. Denies fevers, chills, cough, coughing up blood, shortness of breath, diaphoresis, leg swelling, or any other associated symptoms.   Per chart review: Pt has been seen twice this year for chest pain with negative cardiac work up.   The history is provided by the patient and medical records.       Past Medical History:  Diagnosis Date  . Allergy    seasonal allergies per pt.  . Anxiety   . Asthma   . COVID-19   . GERD (gastroesophageal reflux disease)     Patient Active Problem List   Diagnosis Date Noted  . Seasonal and perennial allergic rhinitis 09/22/2019  . Allergic conjunctivitis 09/22/2019  . Exercise-induced bronchospasm 09/22/2019  . GERD (gastroesophageal reflux disease) 09/22/2019  . Atelectasis   . Spontenous pneumothorax of right lung 06/16/2018    History reviewed. No pertinent surgical history.     Family History  Problem Relation Age of Onset  . Mental illness Sister   . Hyperlipidemia Maternal Grandmother   . Colon cancer Neg Hx   . Esophageal cancer Neg Hx   . Stomach cancer  Neg Hx   . Rectal cancer Neg Hx     Social History   Tobacco Use  . Smoking status: Never Smoker  . Smokeless tobacco: Never Used  Vaping Use  . Vaping Use: Never used  Substance Use Topics  . Alcohol use: Not Currently  . Drug use: No    Home Medications Prior to Admission medications   Medication Sig Start Date End Date Taking? Authorizing Provider  acetaminophen (TYLENOL) 500 MG tablet Take 500 mg by mouth every 6 (six) hours as needed for headache (pain).    [provider]  albuterol (VENTOLIN HFA) 108 (90 Base) MCG/ACT inhaler Inhale 2 puffs into the lungs every 6 (six) hours as needed for wheezing or shortness of breath. 09/22/19   Bobbitt, Heywood Iles, MD  azelastine (ASTELIN) 0.1 % nasal spray Place 1-2 sprays into both nostrils 2 (two) times daily as needed for rhinitis. 09/22/19   Bobbitt, Heywood Iles, MD  calcium carbonate (TUMS EX) 750 MG chewable tablet Chew 1-2 tablets by mouth 2 (two) times daily as needed for heartburn.     [provider]  cetirizine (ZYRTEC) 10 MG tablet Take 10 mg by mouth daily.    [provider]  dicyclomine (BENTYL) 20 MG tablet Take 1 tablet (20 mg total) by mouth 3 (three) times daily as needed for spasms (abdominal craming). 08/01/19   Long, Arlyss Repress, MD  famotidine (PEPCID) 20 MG tablet Take 1 tablet (20 mg total) by mouth 2 (two) times daily for 14 days. Patient taking differently: Take 20 mg by mouth daily.  06/10/19 06/27/28  Couture, Cortni S, PA-C  hydrOXYzine (ATARAX/VISTARIL) 25 MG tablet Take 1 tablet (25 mg total) by mouth every 6 (six) hours as needed for anxiety. Patient not taking: Reported on 07/18/2019 06/25/19   Arthor Captain, PA-C  ondansetron (ZOFRAN ODT) 4 MG disintegrating tablet Take 1 tablet (4 mg total) by mouth every 8 (eight) hours as needed for nausea or vomiting. 08/01/19   Long, Arlyss Repress, MD  triamcinolone ointment (KENALOG) 0.1 % Apply 1 application topically 2 (two) times daily. 09/23/19    Alfonse Spruce, MD    Allergies    Ativan [lorazepam] and Paxil [paroxetine hcl]  Review of Systems   Review of Systems  Constitutional: Negative for chills and fever.  Respiratory: Negative for cough and shortness of breath.   Cardiovascular: Positive for chest pain. Negative for palpitations and leg swelling.  Gastrointestinal: Negative for abdominal pain and nausea.  All other systems reviewed and are negative.   Physical Exam Updated Vital Signs BP 116/80   Pulse 72   Temp 98.1 F (36.7 C)   Resp 16   Ht 5\' 9"  (1.753 m)   Wt 74.8 kg   SpO2 97%   BMI 24.37 kg/m   Physical Exam Vitals and nursing note reviewed.  Constitutional:      Appearance: He is not ill-appearing or diaphoretic.  HENT:     Head: Normocephalic and atraumatic.  Eyes:     Conjunctiva/sclera: Conjunctivae normal.  Cardiovascular:     Rate and Rhythm: Normal rate and regular rhythm.     Pulses:          Radial pulses are 2+ on the right side and 2+ on the left side.       Dorsalis pedis pulses are 2+ on the right side and 2+ on the left side.     Heart sounds: Normal heart sounds.  Pulmonary:     Effort: Pulmonary effort is normal.     Breath sounds: Normal breath sounds. No decreased breath sounds, wheezing, rhonchi or rales.  Chest:     Chest wall: Tenderness present.  Abdominal:     Palpations: Abdomen is soft.     Tenderness: There is no abdominal tenderness. There is no guarding or rebound.  Musculoskeletal:     Cervical back: Neck supple.     Right lower leg: No edema.     Left lower leg: No edema.  Skin:    General: Skin is warm and dry.  Neurological:     Mental Status: He is alert.     ED Results / Procedures / Treatments   Labs (all labs ordered are listed, but only abnormal results are displayed) Labs Reviewed  BASIC METABOLIC PANEL  CBC  D-DIMER, QUANTITATIVE (NOT AT [facility])  TROPONIN I (HIGH SENSITIVITY)  TROPONIN I (HIGH SENSITIVITY)     EKG None  Radiology DG Chest 2 View  Result Date: 04/22/2020 CLINICAL DATA:  Left-sided chest pain. EXAM: CHEST - 2 VIEW COMPARISON:  09/25/2019 FINDINGS: The cardiac silhouette, mediastinal and hilar contours are normal. The lungs are clear. No pleural effusions or pulmonary lesions. No pneumothorax. The bony thorax is intact. IMPRESSION: No acute cardiopulmonary findings. Electronically Signed   By: 11/25/2019 M.D.   On: 04/22/2020 14:29    Procedures Procedures (including critical care time)  Medications Ordered in ED Medications - No data to display  ED Course  I have reviewed the triage vital signs and the nursing notes.  Pertinent labs & imaging results that were available during my care of the patient were reviewed by me and considered in my medical decision making (see chart for details).    MDM Rules/Calculators/A&P                          34 year old male presents to the ED today complaining of intermittent left-sided chest pain for the past week with one episode of emesis.  Has been taking ibuprofen with mild relief.  On arrival to the ED vitals are stable, patient is afebrile, nontachycardic, nontachypneic, no acute distress.  EKG without acute ischemic changes.  Chest x-ray clear without any signs of infection, spontaneous pneumo.  BC without leukocytosis, hemoglobin stable at 16.4.  BMP without electrolyte abnormalities.  Initial troponin less than 2 and repeat less than 2.  On exam patient has obvious chest wall tenderness palpation, he denies any heavy lifting or recent trauma.  I do suspect musculoskeletal pain in nature however patient does report he recently traveled to [place] about 1 month ago.  In this setting will plan for D-dimer, if negative we will plan to discharge home.  Patient advised to follow-up with his PCP for same.  He is in agreement with plan.   At shift change case signed out to oncoming ED PA Cortni Couture who will dispo patient after d  dimer if negative.   This note was prepared using Dragon voice recognition software and may include unintentional dictation errors due to the inherent limitations of voice recognition software.  Final Clinical Impression(s) / ED Diagnoses Final diagnoses:  None    Rx / DC Orders ED Discharge Orders    None       Tanda Rockers, PA-C 04/22/20 2357    Benjiman Core, MD 04/23/20 [phone]

## 2020-04-22 NOTE — ED Triage Notes (Signed)
Pt reports sharp, mid to L chest pain that radiates down L arm, hx of spontaneous pneumo in 2020, denies sob. resp e/u, nad. Denies cough or recent sick contacts.

## 2020-04-22 NOTE — ED Provider Notes (Signed)
At shift change, care assumed from [place], [place]. See her note for full H&P.   Per her note, "Timothy Horn is a 34 y.o. male with PMHx anxiety, asthma, COVID in Jan of this year, GERD who presents to the ED today with complaint of gradual onset, intermittent, sharp, left sided chest pain x 1 week. Pt reports that the pain was worse today prompting him to come to the ED. He states that when he first had the pain he did have an episode of emesis however has not had any since. He has a history of GERD however states this feels different. He denies any chest trauma. No recent heavy lifting. Pt does mention he recently travelled to [place] last month via car; no history of DVT/PE. No exogenous hormone use. No active malignancy. No hemoptysis. He also had COVID in January of this year. Denies fevers, chills, cough, coughing up blood, shortness of breath, diaphoresis, leg swelling, or any other associated symptoms.   Per chart review: Pt has been seen twice this year for chest pain with negative cardiac work up.   The history is provided by the patient and medical records. "   Physical Exam  BP 116/80    Pulse 72    Temp 98.1 F (36.7 C)    Resp 16    Ht 5\' 9"  (1.753 m)    Wt 74.8 kg    SpO2 97%    BMI 24.37 kg/m   Physical Exam Constitutional:      General: He is not in acute distress.    Appearance: He is well-developed.  Eyes:     Conjunctiva/sclera: Conjunctivae normal.  Cardiovascular:     Rate and Rhythm: Normal rate and regular rhythm.  Pulmonary:     Effort: Pulmonary effort is normal.     Breath sounds: Normal breath sounds.  Skin:    General: Skin is warm and dry.  Neurological:     Mental Status: He is alert and oriented to person, place, and time.     ED Course/Procedures     Procedures  Results for orders placed or performed during the hospital encounter of 04/22/20  Basic metabolic panel  Result Value Ref Range   Sodium 139 135 - 145 mmol/L   Potassium 3.7 3.5 -  5.1 mmol/L   Chloride 101 98 - 111 mmol/L   CO2 26 22 - 32 mmol/L   Glucose, Bld 88 70 - 99 mg/dL   BUN 10 6 - 20 mg/dL   Creatinine, Ser 14/02/21 0.61 - 1.24 mg/dL   Calcium 9.5 8.9 - 3.24 mg/dL   GFR, Estimated 40.1 >02 mL/min   Anion gap 12 5 - 15  CBC  Result Value Ref Range   WBC 4.6 4.0 - 10.5 K/uL   RBC 5.37 4.22 - 5.81 MIL/uL   Hemoglobin 16.4 13.0 - 17.0 g/dL   HCT >72 39 - 52 %   MCV 86.0 80.0 - 100.0 fL   MCH 30.5 26.0 - 34.0 pg   MCHC 35.5 30.0 - 36.0 g/dL   RDW 53.6 64.4 - 03.4 %   Platelets 264 150 - 400 K/uL   nRBC 0.0 0.0 - 0.2 %  D-dimer, quantitative  Result Value Ref Range   D-Dimer, Quant <0.27 0.00 - 0.50 ug/mL-FEU  Troponin I (High Sensitivity)  Result Value Ref Range   Troponin I (High Sensitivity) <2 <18 ng/L  Troponin I (High Sensitivity)  Result Value Ref Range   Troponin I (High Sensitivity) <2 <  18 ng/L   DG Chest 2 View  Result Date: 04/22/2020 CLINICAL DATA:  Left-sided chest pain. EXAM: CHEST - 2 VIEW COMPARISON:  09/25/2019 FINDINGS: The cardiac silhouette, mediastinal and hilar contours are normal. The lungs are clear. No pleural effusions or pulmonary lesions. No pneumothorax. The bony thorax is intact. IMPRESSION: No acute cardiopulmonary findings. Electronically Signed   By: Rudie Meyer M.D.   On: 04/22/2020 14:29     MDM   Briefly, pt presenting with chest pain.   Labs reassuring. Neg trops x2. ekg unremakable. cxr neg.   At shift change pending D-dimer.  D-dimer negative.  Low suspicion for PE.  Patient work-up reassuring and feel he is appropriate for discharge home with follow-up with his PCP and strict return precautions.  He voiced understanding of plan reasons to return.  Questions answered.  Patient stable for discharge.       Rayne Du 04/23/20 0102    Benjiman Core, MD 04/23/20 [phone]

## 2020-04-23 LAB — D-DIMER, QUANTITATIVE: D-Dimer, Quant: <0.27 ug/mL-FEU (ref 0.00–0.50)

## 2020-04-23 MED ORDER — NAPROXEN 500 MG PO TABS: 500.0000 mg | ORAL_TABLET | Freq: Two times a day (BID) | ORAL | 0 refills | Status: AC

## 2020-04-23 NOTE — ED Notes (Signed)
Pt fed per order. 

## 2020-04-23 NOTE — Discharge Instructions (Signed)

## 2020-09-28 ENCOUNTER — Emergency Department (HOSPITAL_COMMUNITY)
Admission: EM | Admit: 2020-09-28 | Discharge: 2020-09-28 | Disposition: A | Discharge status: Home / Self Care | Payer: Medicaid Other | Attending: Emergency Medicine | Admitting: Emergency Medicine

## 2020-09-28 ENCOUNTER — Encounter (HOSPITAL_COMMUNITY): Payer: Self-pay

## 2020-09-28 ENCOUNTER — Emergency Department (HOSPITAL_COMMUNITY): Payer: Medicaid Other

## 2020-09-28 DIAGNOSIS — M542 Cervicalgia: Secondary | ICD-10-CM | POA: Insufficient documentation

## 2020-09-28 DIAGNOSIS — S9031XA Contusion of right foot, initial encounter: Secondary | ICD-10-CM

## 2020-09-28 DIAGNOSIS — S20211A Contusion of right front wall of thorax, initial encounter: Secondary | ICD-10-CM

## 2020-09-28 DIAGNOSIS — Z8616 Personal history of COVID-19: Secondary | ICD-10-CM | POA: Insufficient documentation

## 2020-09-28 DIAGNOSIS — J45909 Unspecified asthma, uncomplicated: Secondary | ICD-10-CM | POA: Insufficient documentation

## 2020-09-28 DIAGNOSIS — W109XXA Fall (on) (from) unspecified stairs and steps, initial encounter: Secondary | ICD-10-CM | POA: Insufficient documentation

## 2020-09-28 DIAGNOSIS — W19XXXA Unspecified fall, initial encounter: Secondary | ICD-10-CM

## 2020-09-28 DIAGNOSIS — S40011A Contusion of right shoulder, initial encounter: Secondary | ICD-10-CM

## 2020-09-28 MED ORDER — METHOCARBAMOL 500 MG PO TABS: 500.0000 mg | ORAL_TABLET | Freq: Three times a day (TID) | ORAL | 0 refills | Status: DC

## 2020-09-28 MED ORDER — IBUPROFEN 800 MG PO TABS
800.0000 mg | ORAL_TABLET | Freq: Three times a day (TID) | ORAL | 0 refills | Status: DC | PRN
Start: 2020-09-28 — End: 2021-03-20

## 2020-09-28 NOTE — ED Provider Notes (Signed)
[facility] EMERGENCY DEPARTMENT Provider Note   CSN: [id number] Arrival date & time: 09/28/20  1007     History Chief Complaint  Patient presents with  . Fall    Rondal Vandevelde is a 35 y.o. male.  The history is provided by the patient. No language interpreter was used.  Fall This is a new problem. The current episode started yesterday. The problem occurs constantly. The problem has not changed since onset.Associated symptoms include chest pain. Pertinent negatives include no abdominal pain. Nothing aggravates the symptoms. Nothing relieves the symptoms. He has tried nothing for the symptoms. The treatment provided no relief.   Pt complains of pain in his right ribs, neck, right shoulder and right foot.      Past Medical History:  Diagnosis Date  . Allergy    seasonal allergies per pt.  . Anxiety   . Asthma   . COVID-19   . GERD (gastroesophageal reflux disease)     Patient Active Problem List   Diagnosis Date Noted  . Seasonal and perennial allergic rhinitis 09/22/2019  . Allergic conjunctivitis 09/22/2019  . Exercise-induced bronchospasm 09/22/2019  . GERD (gastroesophageal reflux disease) 09/22/2019  . Atelectasis   . Spontenous pneumothorax of right lung 06/16/2018    History reviewed. No pertinent surgical history.     Family History  Problem Relation Age of Onset  . Mental illness Sister   . Hyperlipidemia Maternal Grandmother   . Colon cancer Neg Hx   . Esophageal cancer Neg Hx   . Stomach cancer Neg Hx   . Rectal cancer Neg Hx     Social History   Tobacco Use  . Smoking status: Never Smoker  . Smokeless tobacco: Never Used  Vaping Use  . Vaping Use: Never used  Substance Use Topics  . Alcohol use: Not Currently  . Drug use: No    Home Medications Prior to Admission medications   Medication Sig Start Date End Date Taking? Authorizing Provider  acetaminophen (TYLENOL) 500 MG tablet Take 500 mg by mouth every 6 (six)  hours as needed for headache (pain).    [provider]  albuterol (VENTOLIN HFA) 108 (90 Base) MCG/ACT inhaler Inhale 2 puffs into the lungs every 6 (six) hours as needed for wheezing or shortness of breath. 09/22/19   Bobbitt, Heywood Iles, MD  azelastine (ASTELIN) 0.1 % nasal spray Place 1-2 sprays into both nostrils 2 (two) times daily as needed for rhinitis. 09/22/19   Bobbitt, Heywood Iles, MD  calcium carbonate (TUMS EX) 750 MG chewable tablet Chew 1-2 tablets by mouth 2 (two) times daily as needed for heartburn.     [provider]  cetirizine (ZYRTEC) 10 MG tablet Take 10 mg by mouth daily.    [provider]  dicyclomine (BENTYL) 20 MG tablet Take 1 tablet (20 mg total) by mouth 3 (three) times daily as needed for spasms (abdominal craming). 08/01/19   Long, Arlyss Repress, MD  famotidine (PEPCID) 20 MG tablet Take 1 tablet (20 mg total) by mouth 2 (two) times daily for 14 days. Patient taking differently: Take 20 mg by mouth daily.  06/10/19 06/27/28  Couture, Cortni S, PA-C  hydrOXYzine (ATARAX/VISTARIL) 25 MG tablet Take 1 tablet (25 mg total) by mouth every 6 (six) hours as needed for anxiety. Patient not taking: Reported on 07/18/2019 06/25/19   Arthor Captain, PA-C  ondansetron (ZOFRAN ODT) 4 MG disintegrating tablet Take 1 tablet (4 mg total) by mouth every 8 (eight) hours as  needed for nausea or vomiting. 08/01/19   Long, Arlyss Repress, MD  triamcinolone ointment (KENALOG) 0.1 % Apply 1 application topically 2 (two) times daily. 09/23/19   Alfonse Spruce, MD    Allergies    Ativan [lorazepam] and Paxil [paroxetine hcl]  Review of Systems   Review of Systems  Cardiovascular: Positive for chest pain.  Gastrointestinal: Negative for abdominal pain.  All other systems reviewed and are negative.   Physical Exam Updated Vital Signs BP 122/70 (BP Location: Right Arm)   Pulse 77   Temp 97.6 F (36.4 C) (Oral)   Resp 18   Ht 5\' 9"  (1.753 m)   Wt 77.1 kg   SpO2 97%    BMI 25.10 kg/m   Physical Exam Vitals and nursing note reviewed.  Constitutional:      Appearance: He is well-developed.  HENT:     Head: Normocephalic and atraumatic.  Eyes:     Conjunctiva/sclera: Conjunctivae normal.  Cardiovascular:     Rate and Rhythm: Normal rate and regular rhythm.     Heart sounds: No murmur heard.   Pulmonary:     Effort: Pulmonary effort is normal. No respiratory distress.     Breath sounds: Normal breath sounds.  Abdominal:     Palpations: Abdomen is soft.     Tenderness: There is no abdominal tenderness.  Musculoskeletal:        General: Tenderness present.     Cervical back: Neck supple.     Comments: Bruised right 2nd toe, tender right ribs, pain with movement of c spine  Skin:    General: Skin is warm and dry.  Neurological:     General: No focal deficit present.     Mental Status: He is alert.  Psychiatric:        Mood and Affect: Mood normal.     ED Results / Procedures / Treatments   Labs (all labs ordered are listed, but only abnormal results are displayed) Labs Reviewed - No data to display  EKG None  Radiology DG Ribs Unilateral W/Chest Right  Result Date: 09/28/2020 CLINICAL DATA:  11/28/2020.  Right rib pain. EXAM: RIGHT RIBS AND CHEST - 3+ VIEW COMPARISON:  Chest x-ray 04/22/2020 FINDINGS: The cardiac silhouette, mediastinal and hilar contours are normal. The lungs are clear. No pleural effusion or pneumothorax. Dedicated views of the right ribs do not demonstrate any definite acute right-sided rib fractures. No pleural thickening, pleural effusion or pneumothorax. IMPRESSION: No acute cardiopulmonary findings and no definite acute right-sided rib fractures. Electronically Signed   By: 14/06/2019 M.D.   On: 09/28/2020 12:02   DG Cervical Spine Complete  Result Date: 09/28/2020 CLINICAL DATA:  11/28/2020 down 10 steps this morning. EXAM: CERVICAL SPINE - COMPLETE 4+ VIEW COMPARISON:  04/01/2007 FINDINGS: There is no evidence of  cervical spine fracture or prevertebral soft tissue swelling. Alignment is normal. No other significant bone abnormalities are identified. IMPRESSION: Negative cervical spine radiographs. Electronically Signed   By: 13/02/2007 M.D.   On: 09/28/2020 11:54   DG Foot Complete Right  Result Date: 09/28/2020 CLINICAL DATA:  Tripped and fell this morning.  Injured right foot. EXAM: RIGHT FOOT COMPLETE - 3+ VIEW COMPARISON:  None. FINDINGS: The joint spaces are maintained. No acute fracture is identified. There is a small bony density noted along the lateral aspect of the second PIP joint. This could be dystrophic calcification related to prior trauma or possibly an old avulsion fracture. Incidental os trigonum. IMPRESSION: No  acute bony findings. Electronically Signed   By: Rudie Meyer M.D.   On: 09/28/2020 11:57    Procedures Procedures   Medications Ordered in ED Medications - No data to display  ED Course  I have reviewed the triage vital signs and the nursing notes.  Pertinent labs & imaging results that were available during my care of the patient were reviewed by me and considered in my medical decision making (see chart for details).    MDM Rules/Calculators/A&P                         MDM; xray ribs. Foot right, and c spine no fracture,  Shoulder from  I doubt fracture.  Pt advised to take ibuprofen for pain  Final Clinical Impression(s) / ED Diagnoses Final diagnoses:  Fall, initial encounter  Contusion of right chest wall, initial encounter  Contusion of multiple sites of right shoulder, initial encounter  Contusion of right foot, initial encounter    Rx / DC Orders ED Discharge Orders         Ordered    ibuprofen (ADVIL) 800 MG tablet  Every 8 hours PRN        09/28/20 1255    methocarbamol (ROBAXIN) 500 MG tablet  3 times daily        09/28/20 1255        An After Visit Summary was printed and given to the patient.   Elson Areas, [place] 09/28/20 1256     Tegeler, Canary Brim, MD 09/28/20 [phone]

## 2020-09-28 NOTE — Discharge Instructions (Signed)
Return if any problems.

## 2020-09-28 NOTE — ED Notes (Signed)
Got patient into a gown on the monitor patient is resting with call bell in reach 

## 2020-09-28 NOTE — ED Triage Notes (Signed)
Fall down 10 steps this morning, with injury to right upper body. C/o pain right rib cage, chest into shoulder and neck. Hx of collapsed right lung 3 years ago. Breathing feels "restricted". Did not hit head. Also pain right foot and hip.

## 2021-03-20 ENCOUNTER — Emergency Department (HOSPITAL_COMMUNITY): Payer: Medicaid Other

## 2021-03-20 ENCOUNTER — Other Ambulatory Visit: Payer: Self-pay

## 2021-03-20 ENCOUNTER — Encounter (HOSPITAL_COMMUNITY): Payer: Self-pay | Admitting: Emergency Medicine

## 2021-03-20 ENCOUNTER — Emergency Department (HOSPITAL_COMMUNITY)
Admission: EM | Admit: 2021-03-20 | Discharge: 2021-03-20 | Disposition: A | Discharge status: Home / Self Care | Payer: Medicaid Other | Attending: Emergency Medicine | Admitting: Emergency Medicine

## 2021-03-20 DIAGNOSIS — J209 Acute bronchitis, unspecified: Secondary | ICD-10-CM | POA: Insufficient documentation

## 2021-03-20 DIAGNOSIS — Z8616 Personal history of COVID-19: Secondary | ICD-10-CM | POA: Insufficient documentation

## 2021-03-20 DIAGNOSIS — B349 Viral infection, unspecified: Secondary | ICD-10-CM | POA: Insufficient documentation

## 2021-03-20 DIAGNOSIS — Z20822 Contact with and (suspected) exposure to covid-19: Secondary | ICD-10-CM | POA: Insufficient documentation

## 2021-03-20 DIAGNOSIS — J45909 Unspecified asthma, uncomplicated: Secondary | ICD-10-CM | POA: Insufficient documentation

## 2021-03-20 LAB — BASIC METABOLIC PANEL
Anion gap: 11 (ref 5–15)
BUN: 9 mg/dL (ref 6–20)
CO2: 25 mmol/L (ref 22–32)
Calcium: 9.2 mg/dL (ref 8.9–10.3)
Chloride: 102 mmol/L (ref 98–111)
Creatinine, Ser: 0.91 mg/dL (ref 0.61–1.24)
GFR, Estimated: >60 mL/min (ref >60)
Glucose, Bld: 114 mg/dL — ABNORMAL HIGH (ref 70–99)
Potassium: 3.7 mmol/L (ref 3.5–5.1)
Sodium: 138 mmol/L (ref 135–145)

## 2021-03-20 LAB — RESP PANEL BY RT-PCR (FLU A&B, COVID) ARPGX2
Influenza A by PCR: NEGATIVE
Influenza B by PCR: NEGATIVE
SARS Coronavirus 2 by RT PCR: NEGATIVE

## 2021-03-20 LAB — CBC
HCT: 41.5 % (ref 39.0–52.0)
Hemoglobin: 14.4 g/dL (ref 13.0–17.0)
MCH: 30.4 pg (ref 26.0–34.0)
MCHC: 34.7 g/dL (ref 30.0–36.0)
MCV: 87.6 fL (ref 80.0–100.0)
Platelets: 267 10*3/uL (ref 150–400)
RBC: 4.74 MIL/uL (ref 4.22–5.81)
RDW: 11.5 % (ref 11.5–15.5)
WBC: 7.3 10*3/uL (ref 4.0–10.5)
nRBC: 0 % (ref 0.0–0.2)

## 2021-03-20 LAB — TROPONIN I (HIGH SENSITIVITY): Troponin I (High Sensitivity): 3 ng/L (ref <18)

## 2021-03-20 MED ORDER — ONDANSETRON 4 MG PO TBDP: 4.0000 mg | ORAL_TABLET | Freq: Three times a day (TID) | ORAL | 0 refills | Status: AC | PRN

## 2021-03-20 MED ORDER — BENZONATATE 100 MG PO CAPS: 100.0000 mg | ORAL_CAPSULE | Freq: Three times a day (TID) | ORAL | 0 refills | Status: AC

## 2021-03-20 MED ORDER — ALBUTEROL SULFATE HFA 108 (90 BASE) MCG/ACT IN AERS
2.0000 | INHALATION_SPRAY | RESPIRATORY_TRACT | Status: DC | PRN
  Administered 2021-03-20: 2 via RESPIRATORY_TRACT
  Filled 2021-03-20: qty 6.7

## 2021-03-20 MED ORDER — ONDANSETRON 4 MG PO TBDP
4.0000 mg | ORAL_TABLET | Freq: Once | ORAL | Status: AC
  Administered 2021-03-20: 4 mg via ORAL
  Filled 2021-03-20: qty 1

## 2021-03-20 NOTE — ED Triage Notes (Signed)
PT reports cough, lightheadedness, generalized weakness, congestion, nausea, vomiting, and "heartburn" x 1 week.  Negative home COVID test on Monday and Tuesday.

## 2021-03-20 NOTE — ED Provider Notes (Signed)
[facility] EMERGENCY DEPARTMENT Provider Note   CSN: [id number] Arrival date & time: 03/20/21  5277     History Chief Complaint  Patient presents with   Cough   Chest Pain    Timothy Horn is a 35 y.o. male.  Patient presents the emergency department today for evaluation of chest pain and cough.  Patient has been sick for the past week with runny nose, nasal congestion, sinus pressure, cough.  He states that he has been having coughing spells and sometimes with posttussive emesis and residual nausea.  Today his chest felt more tight prompting emergency department visit.  No history of asthma but does endorse some wheezing when he is coughing.  He has felt generally fatigued.  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  He has a child at home who has been having mild respiratory symptoms recently.  Patient reports no fevers, maximum temperature of 99.8 F. Has been using OTC meds without much improvement.       Past Medical History:  Diagnosis Date   Allergy    seasonal allergies per pt.   Anxiety    Asthma    COVID-19    GERD (gastroesophageal reflux disease)     Patient Active Problem List   Diagnosis Date Noted   Seasonal and perennial allergic rhinitis 09/22/2019   Allergic conjunctivitis 09/22/2019   Exercise-induced bronchospasm 09/22/2019   GERD (gastroesophageal reflux disease) 09/22/2019   Atelectasis    Spontenous pneumothorax of right lung 06/16/2018    History reviewed. No pertinent surgical history.     Family History  Problem Relation Age of Onset   Mental illness Sister    Hyperlipidemia Maternal Grandmother    Colon cancer Neg Hx    Esophageal cancer Neg Hx    Stomach cancer Neg Hx    Rectal cancer Neg Hx     Social History   Tobacco Use   Smoking status: Never   Smokeless tobacco: Never  Vaping Use   Vaping Use: Never used  Substance Use  Topics   Alcohol use: Not Currently   Drug use: No    Home Medications Prior to Admission medications   Medication Sig Start Date End Date Taking? Authorizing Provider  acetaminophen (TYLENOL) 500 MG tablet Take 500 mg by mouth every 6 (six) hours as needed for headache (pain).    [provider]  albuterol (VENTOLIN HFA) 108 (90 Base) MCG/ACT inhaler Inhale 2 puffs into the lungs every 6 (six) hours as needed for wheezing or shortness of breath. 09/22/19   Bobbitt, Heywood Iles, MD  azelastine (ASTELIN) 0.1 % nasal spray Place 1-2 sprays into both nostrils 2 (two) times daily as needed for rhinitis. 09/22/19   Bobbitt, Heywood Iles, MD  calcium carbonate (TUMS EX) 750 MG chewable tablet Chew 1-2 tablets by mouth 2 (two) times daily as needed for heartburn.     [provider]  cetirizine (ZYRTEC) 10 MG tablet Take 10 mg by mouth daily.    [provider]  dicyclomine (BENTYL) 20 MG tablet Take 1 tablet (20 mg total) by mouth 3 (three) times daily as needed for spasms (abdominal craming). 08/01/19   Long, Arlyss Repress, MD  famotidine (PEPCID) 20 MG tablet Take 1 tablet (20 mg total) by mouth 2 (two) times daily for 14 days. Patient taking differently: Take 20 mg by mouth daily.  06/10/19 06/27/28  Couture,  Cortni S, PA-C  hydrOXYzine (ATARAX/VISTARIL) 25 MG tablet Take 1 tablet (25 mg total) by mouth every 6 (six) hours as needed for anxiety. Patient not taking: Reported on 07/18/2019 06/25/19   Arthor Captain, PA-C  ibuprofen (ADVIL) 800 MG tablet Take 1 tablet (800 mg total) by mouth every 8 (eight) hours as needed. 09/28/20   Elson Areas, PA-C  methocarbamol (ROBAXIN) 500 MG tablet Take 1 tablet (500 mg total) by mouth 3 (three) times daily. 09/28/20   Elson Areas, PA-C  ondansetron (ZOFRAN ODT) 4 MG disintegrating tablet Take 1 tablet (4 mg total) by mouth every 8 (eight) hours as needed for nausea or vomiting. 08/01/19   Long, Arlyss Repress, MD  triamcinolone ointment (KENALOG)  0.1 % Apply 1 application topically 2 (two) times daily. 09/23/19   Alfonse Spruce, MD    Allergies    Ativan [lorazepam] and Paxil [paroxetine hcl]  Review of Systems   Review of Systems  Constitutional:  Negative for chills and fever.  HENT:  Positive for congestion, rhinorrhea and sinus pressure. Negative for sore throat.   Eyes:  Negative for redness.  Respiratory:  Positive for cough and wheezing.   Cardiovascular:  Positive for chest pain.  Gastrointestinal:  Positive for nausea and vomiting. Negative for abdominal pain and diarrhea.  Genitourinary:  Negative for dysuria and hematuria.  Musculoskeletal:  Negative for myalgias.  Skin:  Negative for rash.  Neurological:  Negative for headaches.   Physical Exam Updated Vital Signs BP (!) 145/92 (BP Location: Right Arm)   Pulse (!) 106   Temp 98.4 F (36.9 C) (Oral)   Resp 17   SpO2 100%   Physical Exam Vitals and nursing note reviewed.  Constitutional:      Appearance: He is well-developed.  HENT:     Head: Normocephalic and atraumatic.     Jaw: No trismus.     Right Ear: Tympanic membrane, ear canal and external ear normal.     Left Ear: Tympanic membrane, ear canal and external ear normal.     Nose: Nose normal. No mucosal edema or rhinorrhea.     Mouth/Throat:     Mouth: Mucous membranes are not dry.     Pharynx: Uvula midline. No oropharyngeal exudate, posterior oropharyngeal erythema or uvula swelling.     Tonsils: No tonsillar abscesses.  Eyes:     General:        Right eye: No discharge.        Left eye: No discharge.     Conjunctiva/sclera: Conjunctivae normal.  Cardiovascular:     Rate and Rhythm: Normal rate and regular rhythm.     Heart sounds: Normal heart sounds.  Pulmonary:     Effort: Pulmonary effort is normal. No respiratory distress.     Breath sounds: Normal breath sounds. No wheezing or rales.     Comments: Dry cough during exam Abdominal:     Palpations: Abdomen is soft.      Tenderness: There is no abdominal tenderness.  Musculoskeletal:     Cervical back: Normal range of motion and neck supple.  Skin:    General: Skin is warm and dry.  Neurological:     Mental Status: He is alert.    ED Results / Procedures / Treatments   Labs (all labs ordered are listed, but only abnormal results are displayed) Labs Reviewed  BASIC METABOLIC PANEL - Abnormal; Notable for the following components:      Result Value   Glucose,  Bld 114 (*)    All other components within normal limits  RESP PANEL BY RT-PCR (FLU A&B, COVID) ARPGX2  CBC  TROPONIN I (HIGH SENSITIVITY)    ED ECG REPORT   Date: 03/20/2021  Rate: 108  Rhythm: sinus tachycardia  QRS Axis: normal  Intervals: normal  ST/T Wave abnormalities: normal  Conduction Disutrbances:none  Narrative Interpretation:   Old EKG Reviewed: changes noted, faster today  I have personally reviewed the EKG tracing and agree with the computerized printout as noted.   Radiology DG Chest 2 View  Result Date: 03/20/2021 CLINICAL DATA:  Cough and light headedness EXAM: CHEST - 2 VIEW COMPARISON:  None. FINDINGS: Normal mediastinum and cardiac silhouette. Normal pulmonary vasculature. No evidence of effusion, infiltrate, or pneumothorax. No acute bony abnormality. IMPRESSION: Normal chest radiograph Electronically Signed   By: Genevive Bi M.D.   On: 03/20/2021 07:52    Procedures Procedures   Medications Ordered in ED Medications  albuterol (VENTOLIN HFA) 108 (90 Base) MCG/ACT inhaler 2 puff (has no administration in time range)  ondansetron (ZOFRAN-ODT) disintegrating tablet 4 mg (has no administration in time range)    ED Course  I have reviewed the triage vital signs and the nursing notes.  Pertinent labs & imaging results that were available during my care of the patient were reviewed by me and considered in my medical decision making (see chart for details).  Patient seen and examined.  Work-up reviewed.   No evidence of cardiac involvement.  Chest x-ray is clear without signs of pneumonia.  EKG reviewed and shows sinus tachycardia.  Low concern for DVT/PE.  Vital signs reviewed and are as follows: BP (!) 145/92 (BP Location: Right Arm)   Pulse (!) 106   Temp 98.4 F (36.9 C) (Oral)   Resp 17   SpO2 100%   Will give a dose of Zofran and p.o. challenge.  We will give albuterol inhaler and prescription for Tessalon.  11:49 AM Patient counseled on supportive care for viral URI.  Urged to see PCP if symptoms persist for more than 3 days. Discussed s/s to return including worsening symptoms, persistent fever, persistent vomiting, or if they have any other concerns. Patient verbalizes understanding and agrees with plan.      MDM Rules/Calculators/A&P                           Patient with symptoms consistent with a viral syndrome, suspect bronchitis.  He did have some chest tightness.  This was evaluated with EKG, troponins, chest x-ray.  No compelling evidence for ACS, PE.  Vitals are stable, no fever. No signs of dehydration. Lung exam normal, no signs of pneumonia. Supportive therapy indicated with return if symptoms worsen.    Final Clinical Impression(s) / ED Diagnoses Final diagnoses:  Acute bronchitis, unspecified organism    Rx / DC Orders ED Discharge Orders          Ordered    benzonatate (TESSALON) 100 MG capsule  Every 8 hours        03/20/21 1149    ondansetron (ZOFRAN ODT) 4 MG disintegrating tablet  Every 8 hours PRN        03/20/21 1149             Renne Crigler, PA-C 03/20/21 1151    Derwood Kaplan, MD 03/22/21 2116

## 2021-03-20 NOTE — Discharge Instructions (Signed)
Please read and follow all provided instructions.  Your diagnoses today include:  1. Acute bronchitis, unspecified organism     Tests performed today include: Chest x-ray: does not show any pneumonia Cardiac enzymes: No signs of stress on the heart EKG: Slightly fast heart rate otherwise no concerning findings Blood counts and electrolytes Vital signs. See below for your results today.   Medications prescribed:  Zofran (ondansetron) - for nausea and vomiting  Tessalon Perles - cough suppressant medication  Albuterol inhaler - medication that opens up your airway  Use inhaler as follows: 1-2 puffs with spacer every 4 hours as needed for wheezing, cough, or shortness of breath.   Take any prescribed medications only as directed.  Home care instructions:  Follow any educational materials contained in this packet.  Follow-up instructions: Please follow-up with your primary care provider in the next 3 days for further evaluation of your symptoms and a recheck if you are not feeling better.   Return instructions:  Please return to the Emergency Department if you experience worsening symptoms. Please return with worsening wheezing, shortness of breath, or difficulty breathing. Return with persistent fever above 101F.  Please return if you have any other emergent concerns.  Additional Information:  Your vital signs today were: BP (!) 145/92 (BP Location: Right Arm)   Pulse (!) 106   Temp 98.4 F (36.9 C) (Oral)   Resp 17   SpO2 100%  If your blood pressure (BP) was elevated above 135/85 this visit, please have this repeated by your doctor within one month. --------------

## 2023-03-16 ENCOUNTER — Other Ambulatory Visit: Payer: Self-pay

## 2023-03-16 ENCOUNTER — Encounter (HOSPITAL_BASED_OUTPATIENT_CLINIC_OR_DEPARTMENT_OTHER): Payer: Self-pay

## 2023-03-16 ENCOUNTER — Emergency Department (HOSPITAL_BASED_OUTPATIENT_CLINIC_OR_DEPARTMENT_OTHER)
Admission: EM | Admit: 2023-03-16 | Discharge: 2023-03-17 | Disposition: A | Discharge status: Home / Self Care | Payer: Medicaid Other | Attending: Emergency Medicine | Admitting: Emergency Medicine

## 2023-03-16 DIAGNOSIS — J069 Acute upper respiratory infection, unspecified: Secondary | ICD-10-CM | POA: Diagnosis not present

## 2023-03-16 DIAGNOSIS — J45909 Unspecified asthma, uncomplicated: Secondary | ICD-10-CM | POA: Diagnosis not present

## 2023-03-16 DIAGNOSIS — Z20822 Contact with and (suspected) exposure to covid-19: Secondary | ICD-10-CM | POA: Diagnosis not present

## 2023-03-16 DIAGNOSIS — R059 Cough, unspecified: Secondary | ICD-10-CM | POA: Diagnosis present

## 2023-03-16 DIAGNOSIS — Z8616 Personal history of COVID-19: Secondary | ICD-10-CM | POA: Insufficient documentation

## 2023-03-16 HISTORY — DX: Other pulmonary collapse: J98.19

## 2023-03-16 LAB — SARS CORONAVIRUS 2 BY RT PCR: SARS Coronavirus 2 by RT PCR: NEGATIVE

## 2023-03-16 LAB — GROUP A STREP BY PCR: Group A Strep by PCR: NOT DETECTED

## 2023-03-16 NOTE — ED Triage Notes (Addendum)
Cough, headache since Monday. Has been taking sudafed, mucinex, and advil with little relief. Wife was recently sick.

## 2023-03-17 ENCOUNTER — Emergency Department (HOSPITAL_BASED_OUTPATIENT_CLINIC_OR_DEPARTMENT_OTHER): Payer: Medicaid Other

## 2023-03-17 MED ORDER — BENZONATATE 100 MG PO CAPS
100.0000 mg | ORAL_CAPSULE | Freq: Once | ORAL | Status: AC
  Administered 2023-03-17: 100 mg via ORAL
  Filled 2023-03-17: qty 1

## 2023-03-17 MED ORDER — BENZONATATE 100 MG PO CAPS: 100.0000 mg | ORAL_CAPSULE | Freq: Three times a day (TID) | ORAL | 0 refills | Status: AC

## 2023-03-17 NOTE — ED Provider Notes (Signed)
[place] EMERGENCY DEPARTMENT AT [facility] Provider Note   CSN: [id number] Arrival date & time: 03/16/23  2203     History  Chief Complaint  Patient presents with   Cough    Timothy Horn is a 37 y.o. male.  HPI     This is a 37 year old male who presents with upper respiratory symptoms.  Patient reports onset of symptoms on Monday.  Wife is sick with similar symptoms but has since recovered.  Reports cough, congestion, headache.  Has tried Sudafed, Mucinex, and Advil.  Reports cough is productive of some mucus.  He is a non-smoker.  He has had some sore throat.  No chest pain, shortness of breath, abdominal pain, nausea, vomiting.    Home Medications Prior to Admission medications   Medication Sig Start Date End Date Taking? Authorizing Provider  benzonatate (TESSALON) 100 MG capsule Take 1 capsule (100 mg total) by mouth every 8 (eight) hours. 03/17/23  Yes Raymonda Pell, Mayer Masker, MD  acetaminophen (TYLENOL) 500 MG tablet Take 500 mg by mouth every 6 (six) hours as needed for headache (pain).    [provider]  albuterol (VENTOLIN HFA) 108 (90 Base) MCG/ACT inhaler Inhale 2 puffs into the lungs every 6 (six) hours as needed for wheezing or shortness of breath. 09/22/19   Bobbitt, Heywood Iles, MD  azelastine (ASTELIN) 0.1 % nasal spray Place 1-2 sprays into both nostrils 2 (two) times daily as needed for rhinitis. 09/22/19   Bobbitt, Heywood Iles, MD  benzonatate (TESSALON) 100 MG capsule Take 1 capsule (100 mg total) by mouth every 8 (eight) hours. 03/20/21   Renne Crigler, PA-C  calcium carbonate (TUMS EX) 750 MG chewable tablet Chew 1-2 tablets by mouth 2 (two) times daily as needed for heartburn.     [provider]  cetirizine (ZYRTEC) 10 MG tablet Take 10 mg by mouth daily.    [provider]  ondansetron (ZOFRAN ODT) 4 MG disintegrating tablet Take 1 tablet (4 mg total) by mouth every 8 (eight) hours as needed for nausea or vomiting.  03/20/21   Renne Crigler, PA-C  triamcinolone ointment (KENALOG) 0.1 % Apply 1 application topically 2 (two) times daily. 09/23/19   Alfonse Spruce, MD      Allergies    Ativan [lorazepam] and Paxil [paroxetine hcl]    Review of Systems   Review of Systems  Constitutional:  Negative for fever.  HENT:  Positive for congestion.   Respiratory:  Positive for cough. Negative for shortness of breath.   Cardiovascular:  Negative for chest pain.  Neurological:  Positive for headaches.  All other systems reviewed and are negative.   Physical Exam Updated Vital Signs BP 107/64 (BP Location: Right Arm)   Pulse 79   Temp 97.9 F (36.6 C) (Oral)   Resp 16   Ht 1.727 m (5\' 8" )   Wt 86.2 kg   SpO2 96%   BMI 28.89 kg/m  Physical Exam Vitals and nursing note reviewed.  Constitutional:      Appearance: He is well-developed. He is not ill-appearing.  HENT:     Head: Normocephalic and atraumatic.     Nose: Congestion present.     Mouth/Throat:     Mouth: Mucous membranes are moist.     Pharynx: No oropharyngeal exudate or posterior oropharyngeal erythema.  Eyes:     Pupils: Pupils are equal, round, and reactive to light.  Cardiovascular:     Rate and Rhythm: Normal rate and regular rhythm.  Heart sounds: Normal heart sounds. No murmur heard. Pulmonary:     Effort: Pulmonary effort is normal. No respiratory distress.     Breath sounds: Normal breath sounds. No wheezing.  Abdominal:     General: Bowel sounds are normal.     Palpations: Abdomen is soft.     Tenderness: There is no abdominal tenderness. There is no rebound.  Musculoskeletal:     Cervical back: Neck supple.  Lymphadenopathy:     Cervical: No cervical adenopathy.  Skin:    General: Skin is warm and dry.  Neurological:     Mental Status: He is alert and oriented to person, place, and time.  Psychiatric:        Mood and Affect: Mood normal.     ED Results / Procedures / Treatments   Labs (all labs  ordered are listed, but only abnormal results are displayed) Labs Reviewed  SARS CORONAVIRUS 2 BY RT PCR  GROUP A STREP BY PCR    EKG None  Radiology DG Chest Portable 1 View  Result Date: 03/17/2023 CLINICAL DATA:  Cough and congestion for 3 days EXAM: PORTABLE CHEST 1 VIEW COMPARISON:  03/20/2021 FINDINGS: Normal cardiomediastinal silhouette. No focal consolidation, pleural effusion, or pneumothorax. No displaced rib fractures. IMPRESSION: No acute cardiopulmonary disease. Electronically Signed   By: Minerva Fester M.D.   On: 03/17/2023 01:15    Procedures Procedures    Medications Ordered in ED Medications  benzonatate (TESSALON) capsule 100 mg (100 mg Oral Given 03/17/23 0041)    ED Course/ Medical Decision Making/ A&P                                 Medical Decision Making Amount and/or Complexity of Data Reviewed Radiology: ordered.  Risk Prescription drug management.   This patient presents to the ED for concern of upper respiratory symptoms, this involves an extensive number of treatment options, and is a complaint that carries with it a high risk of complications and morbidity.  I considered the following differential and admission for this acute, potentially life threatening condition.  The differential diagnosis includes viral URI such as COVID, pneumonia, allergies, asthma exacerbation  MDM:    This is a 37 year old male who presents with upper respiratory symptoms.  He is nontoxic and afebrile.  Vital signs are reassuring.  Physical exam is benign.  No wheezing.  Does have a known sick contact.  Suspect viral etiology.  COVID testing is negative.  Strep is also negative.  Discussed with patient that likely supportive management given benign exam.  Would have low suspicion for pneumonia.  Patient is concerned about his productive cough and would like to have a chest x-ray.  Chest x-ray does not show any obvious pneumonia.  We discussed adding Flonase and nasal  saline.  Will also give a prescription for Tessalon Perles.  (Labs, imaging, consults)  Labs: I Ordered, and personally interpreted labs.  The pertinent results include: COVID, strep  Imaging Studies ordered: I ordered imaging studies including chest x-ray I independently visualized and interpreted imaging. I agree with the radiologist interpretation  Additional history obtained from chart review.  External records from outside source obtained and reviewed including prior evaluations  Cardiac Monitoring: The patient was maintained on a cardiac monitor.  If on the cardiac monitor, I personally viewed and interpreted the cardiac monitored which showed an underlying rhythm of: Sinus rhythm  Reevaluation: After the interventions noted  above, I reevaluated the patient and found that they have :stayed the same  Social Determinants of Health:  lives independently  Disposition: Discharge  Co morbidities that complicate the patient evaluation  Past Medical History:  Diagnosis Date   Allergy    seasonal allergies per pt.   Anxiety    Asthma    Collapsed lung    COVID-19    GERD (gastroesophageal reflux disease)      Medicines Meds ordered this encounter  Medications   benzonatate (TESSALON) capsule 100 mg   benzonatate (TESSALON) 100 MG capsule    Sig: Take 1 capsule (100 mg total) by mouth every 8 (eight) hours.    Dispense:  21 capsule    Refill:  0    I have reviewed the patients home medicines and have made adjustments as needed  Problem List / ED Course: Problem List Items Addressed This Visit   None Visit Diagnoses     Viral URI with cough    -  Primary                   Final Clinical Impression(s) / ED Diagnoses Final diagnoses:  Viral URI with cough    Rx / DC Orders ED Discharge Orders          Ordered    benzonatate (TESSALON) 100 MG capsule  Every 8 hours        03/17/23 0122              Shon Baton, MD 03/17/23  0126

## 2023-03-17 NOTE — Discharge Instructions (Signed)
You were seen today for upper respiratory symptoms.  These are likely viral in nature.  Your COVID testing is negative.  Your chest x-ray does not show any evidence of pneumonia.  You may try Flonase and nasal saline.  Use Tessalon Perles for cough.  If you develop fevers or worsening symptoms, you should be reevaluated.

## 2023-03-20 ENCOUNTER — Emergency Department (HOSPITAL_BASED_OUTPATIENT_CLINIC_OR_DEPARTMENT_OTHER)
Admission: EM | Admit: 2023-03-20 | Discharge: 2023-03-20 | Disposition: A | Discharge status: Home / Self Care | Payer: Medicaid Other | Attending: Emergency Medicine | Admitting: Emergency Medicine

## 2023-03-20 ENCOUNTER — Encounter (HOSPITAL_BASED_OUTPATIENT_CLINIC_OR_DEPARTMENT_OTHER): Payer: Self-pay

## 2023-03-20 ENCOUNTER — Other Ambulatory Visit: Payer: Self-pay

## 2023-03-20 ENCOUNTER — Emergency Department (HOSPITAL_BASED_OUTPATIENT_CLINIC_OR_DEPARTMENT_OTHER): Payer: Medicaid Other | Admitting: Radiology

## 2023-03-20 DIAGNOSIS — Z1152 Encounter for screening for COVID-19: Secondary | ICD-10-CM | POA: Insufficient documentation

## 2023-03-20 DIAGNOSIS — Z7951 Long term (current) use of inhaled steroids: Secondary | ICD-10-CM | POA: Diagnosis not present

## 2023-03-20 DIAGNOSIS — J40 Bronchitis, not specified as acute or chronic: Secondary | ICD-10-CM | POA: Insufficient documentation

## 2023-03-20 DIAGNOSIS — J45909 Unspecified asthma, uncomplicated: Secondary | ICD-10-CM | POA: Diagnosis not present

## 2023-03-20 DIAGNOSIS — R059 Cough, unspecified: Secondary | ICD-10-CM | POA: Diagnosis present

## 2023-03-20 LAB — RESP PANEL BY RT-PCR (RSV, FLU A&B, COVID)  RVPGX2
Influenza A by PCR: NEGATIVE
Influenza B by PCR: NEGATIVE
Resp Syncytial Virus by PCR: NEGATIVE
SARS Coronavirus 2 by RT PCR: NEGATIVE

## 2023-03-20 MED ORDER — DEXAMETHASONE SODIUM PHOSPHATE 10 MG/ML IJ SOLN
10.0000 mg | Freq: Once | INTRAMUSCULAR | Status: AC
  Administered 2023-03-20: 10 mg via INTRAMUSCULAR

## 2023-03-20 MED ORDER — ALBUTEROL SULFATE HFA 108 (90 BASE) MCG/ACT IN AERS: 1.0000 | INHALATION_SPRAY | Freq: Four times a day (QID) | RESPIRATORY_TRACT | 0 refills | Status: AC | PRN

## 2023-03-20 MED ORDER — DEXAMETHASONE SODIUM PHOSPHATE 10 MG/ML IJ SOLN
10.0000 mg | Freq: Once | INTRAMUSCULAR | Status: DC
  Filled 2023-03-20: qty 1

## 2023-03-20 NOTE — ED Provider Notes (Signed)
[facility] EM[facility]Y [facility] AT [facility] Provider Note   CSN: [id number] Arrival date & time: 03/20/23  1810     History  Chief Complaint  Patient presents with   Cough   Shortness of Breath    Timothy Horn is a 37 y.o. male withpast medical history significant for bronchitis and exercise-induced asthma presents today for cough/flu symptoms for the past week.  Patient states last week he had mild fever, body aches, congestion, sore throat, chills, and cough.  Patient states that his cough has worsened since Sunday and has had some shortness of breath.  Patient has tried using albuterol inhaler with no improvement.  Patient is taking dual action Advil for pain.  Patient states his daughter and wife had similar symptoms but have since gotten better.  Patient also endorses cough induced vomiting.  Patient denies nausea, diarrhea, chest pain.   Cough Associated symptoms: shortness of breath   Shortness of Breath Associated symptoms: cough        Home Medications Prior to Admission medications   Medication Sig Start Date End Date Taking? Authorizing Provider  albuterol (VENTOLIN HFA) 108 (90 Base) MCG/ACT inhaler Inhale 1-2 puffs into the lungs every 6 (six) hours as needed for wheezing or shortness of breath. 03/20/23  Yes Dolphus Jenny, PA-C  acetaminophen (TYLENOL) 500 MG tablet Take 500 mg by mouth every 6 (six) hours as needed for headache (pain).    [provider]  azelastine (ASTELIN) 0.1 % nasal spray Place 1-2 sprays into both nostrils 2 (two) times daily as needed for rhinitis. 09/22/19   Bobbitt, Heywood Iles, MD  benzonatate (TESSALON) 100 MG capsule Take 1 capsule (100 mg total) by mouth every 8 (eight) hours. 03/20/21   Renne Crigler, PA-C  benzonatate (TESSALON) 100 MG capsule Take 1 capsule (100 mg total) by mouth every 8 (eight) hours. 03/17/23   Horton, Mayer Masker, MD  calcium carbonate (TUMS EX) 750 MG chewable tablet Chew 1-2 tablets by  mouth 2 (two) times daily as needed for heartburn.     [provider]  cetirizine (ZYRTEC) 10 MG tablet Take 10 mg by mouth daily.    [provider]  ondansetron (ZOFRAN ODT) 4 MG disintegrating tablet Take 1 tablet (4 mg total) by mouth every 8 (eight) hours as needed for nausea or vomiting. 03/20/21   Renne Crigler, PA-C  triamcinolone ointment (KENALOG) 0.1 % Apply 1 application topically 2 (two) times daily. 09/23/19   Alfonse Spruce, MD      Allergies    Ativan [lorazepam] and Paxil [paroxetine hcl]    Review of Systems   Review of Systems  Respiratory:  Positive for cough and shortness of breath.     Physical Exam Updated Vital Signs BP 117/68   Pulse 80   Temp 98.5 F (36.9 C) (Oral)   Resp 18   Ht 5\' 8"  (1.727 m)   Wt 86.2 kg   SpO2 99%   BMI 28.89 kg/m  Physical Exam Vitals and nursing note reviewed.  Constitutional:      General: He is not in acute distress.    Appearance: He is well-developed.  HENT:     Head: Normocephalic and atraumatic.     Nose: Congestion present.     Mouth/Throat:     Mouth: Mucous membranes are moist.     Pharynx: Oropharynx is clear.  Eyes:     Conjunctiva/sclera: Conjunctivae normal.  Cardiovascular:     Rate and Rhythm: Normal rate  and regular rhythm.     Heart sounds: No murmur heard. Pulmonary:     Effort: Pulmonary effort is normal. No respiratory distress.     Breath sounds: Normal breath sounds. No wheezing, rhonchi or rales.  Abdominal:     Palpations: Abdomen is soft.     Tenderness: There is no abdominal tenderness.  Musculoskeletal:        General: No swelling.     Cervical back: Neck supple.  Skin:    General: Skin is warm and dry.     Capillary Refill: Capillary refill takes less than 2 seconds.  Neurological:     General: No focal deficit present.     Mental Status: He is alert.  Psychiatric:        Mood and Affect: Mood normal.     ED Results / Procedures / Treatments    Labs (all labs ordered are listed, but only abnormal results are displayed) Labs Reviewed  RESP PANEL BY RT-PCR (RSV, FLU A&B, COVID)  [id number]    EKG None  Radiology DG Chest 2 View  Result Date: 03/20/2023 CLINICAL DATA:  Productive cough. EXAM: CHEST - 2 VIEW COMPARISON:  Radiograph 03/17/2023 FINDINGS: The cardiomediastinal contours are normal. Minor bronchial thickening. Pulmonary vasculature is normal. No consolidation, pleural effusion, or pneumothorax. No acute osseous abnormalities are seen. IMPRESSION: Minor bronchial thickening. No consolidation. Electronically Signed   By: Narda Rutherford M.D.   On: 03/20/2023 21:38    Procedures Procedures    Medications Ordered in ED Medications  dexamethasone (DECADRON) injection 10 mg (has no administration in time range)    ED Course/ Medical Decision Making/ A&P                                 Medical Decision Making Amount and/or Complexity of Data Reviewed Radiology: ordered.   This patient presents to the ED with chief complaint(s) of cough/shortness of breath with pertinent past medical history of bronchitis, exercise-induced asthma which further complicates the presenting complaint. The complaint involves an extensive differential diagnosis and also carries with it a high risk of complications and morbidity.    The differential diagnosis includes acute bronchitis, pneumonia, URI  ED Course and Reassessment: Patient given Decadron  Independent labs interpretation:  The following labs were independently interpreted:  Respiratory panel: Negative  Independent visualization of imaging: - I independently visualized the following imaging with scope of interpretation limited to determining acute life threatening conditions related to emergency care: Chest x-ray, which revealed Minor bronchial thickening. No consolidation.   Consultation: - Consulted or discussed management/test interpretation w/ external professional:  None  Consideration for admission or further workup: Considered for admission or further workup however patient's vital signs have been stable throughout ER visit.  Patient's physical exam was reassuring.  Patient should have follow-up outpatient with PCP if symptoms persist.          Final Clinical Impression(s) / ED Diagnoses Final diagnoses:  Bronchitis    Rx / DC Orders ED Discharge Orders          Ordered    albuterol (VENTOLIN HFA) 108 (90 Base) MCG/ACT inhaler  Every 6 hours PRN        03/20/23 2208              Dolphus Jenny, PA-C 03/20/23 2208    Alvira Monday, MD 03/21/23 1319

## 2023-03-20 NOTE — ED Notes (Signed)
Returns from XRAY

## 2023-03-20 NOTE — ED Triage Notes (Signed)
Pt POV from home reporting cold/flu sx past week. Hx bronchitis, states sx have worsened, SOB, using inhaler w no improvement.

## 2023-03-20 NOTE — Discharge Instructions (Addendum)
Today you were seen for acute bronchitis.  Please continue to take your inhaler as needed.  Thank you for letting us treat you today. After reviewing your labs and imaging, I feel you are safe to go home. Please follow up with your PCP in the next several days and provide them with your records from this visit. Return to the Emergency Room if pain becomes severe or symptoms worsen.

## 2023-03-20 NOTE — ED Notes (Signed)
Out for XRAY

## 2023-04-02 ENCOUNTER — Encounter (HOSPITAL_COMMUNITY): Payer: Self-pay | Admitting: Emergency Medicine

## 2023-04-02 ENCOUNTER — Emergency Department (HOSPITAL_COMMUNITY)
Admission: EM | Admit: 2023-04-02 | Discharge: 2023-04-03 | Disposition: A | Discharge status: Home / Self Care | Payer: Medicaid Other | Attending: Emergency Medicine | Admitting: Emergency Medicine

## 2023-04-02 ENCOUNTER — Emergency Department (HOSPITAL_COMMUNITY): Payer: Medicaid Other

## 2023-04-02 ENCOUNTER — Other Ambulatory Visit: Payer: Self-pay

## 2023-04-02 DIAGNOSIS — R0789 Other chest pain: Secondary | ICD-10-CM | POA: Diagnosis present

## 2023-04-02 NOTE — ED Triage Notes (Signed)
Pt here from home with c/o chest pain and back pain after moving furniture yesterday , has hx on pneumo

## 2023-04-03 MED ORDER — KETOROLAC TROMETHAMINE 60 MG/2ML IM SOLN
30.0000 mg | Freq: Once | INTRAMUSCULAR | Status: AC
  Administered 2023-04-03: 30 mg via INTRAMUSCULAR
  Filled 2023-04-03: qty 2

## 2023-04-03 MED ORDER — CYCLOBENZAPRINE HCL 10 MG PO TABS: 10.0000 mg | ORAL_TABLET | Freq: Two times a day (BID) | ORAL | 0 refills | Status: AC | PRN

## 2023-04-03 MED ORDER — MELOXICAM 15 MG PO TABS: 15.0000 mg | ORAL_TABLET | Freq: Every day | ORAL | 0 refills | Status: AC

## 2023-04-03 NOTE — ED Provider Notes (Signed)
[place] EMERGENCY DEPARTMENT AT [facility] Provider Note   CSN: [id number] Arrival date & time: 04/02/23  1548     History  Chief Complaint  Patient presents with   Chest Pain   Back Pain    Timothy Horn is a 37 y.o. male.  37 year old male with a h/o PTX here with right sided chest pain. States he was moving boxes on Sunday and shortly after started having lateral/posterior right rib pain, worse with deep breath and movement. Shot up towards his shoulder and thought it felt similar to when he had a PTX in the past and wanted to get checked out. Tried advil at home.    Chest Pain Associated symptoms: back pain   Back Pain Associated symptoms: chest pain        Home Medications Prior to Admission medications   Medication Sig Start Date End Date Taking? Authorizing Provider  cyclobenzaprine (FLEXERIL) 10 MG tablet Take 1 tablet (10 mg total) by mouth 2 (two) times daily as needed for muscle spasms. 04/03/23  Yes Jerzi Tigert, Barbara Cower, MD  meloxicam (MOBIC) 15 MG tablet Take 1 tablet (15 mg total) by mouth daily. 04/03/23  Yes Nijah Tejera, Barbara Cower, MD  acetaminophen (TYLENOL) 500 MG tablet Take 500 mg by mouth every 6 (six) hours as needed for headache (pain).    [provider]  albuterol (VENTOLIN HFA) 108 (90 Base) MCG/ACT inhaler Inhale 1-2 puffs into the lungs every 6 (six) hours as needed for wheezing or shortness of breath. 03/20/23   Dolphus Jenny, PA-C  azelastine (ASTELIN) 0.1 % nasal spray Place 1-2 sprays into both nostrils 2 (two) times daily as needed for rhinitis. 09/22/19   Bobbitt, Heywood Iles, MD  benzonatate (TESSALON) 100 MG capsule Take 1 capsule (100 mg total) by mouth every 8 (eight) hours. 03/20/21   Renne Crigler, PA-C  benzonatate (TESSALON) 100 MG capsule Take 1 capsule (100 mg total) by mouth every 8 (eight) hours. 03/17/23   Horton, Mayer Masker, MD  calcium carbonate (TUMS EX) 750 MG chewable tablet Chew 1-2 tablets by mouth 2 (two) times  daily as needed for heartburn.     [provider]  cetirizine (ZYRTEC) 10 MG tablet Take 10 mg by mouth daily.    [provider]  ondansetron (ZOFRAN ODT) 4 MG disintegrating tablet Take 1 tablet (4 mg total) by mouth every 8 (eight) hours as needed for nausea or vomiting. 03/20/21   Renne Crigler, PA-C  triamcinolone ointment (KENALOG) 0.1 % Apply 1 application topically 2 (two) times daily. 09/23/19   Alfonse Spruce, MD      Allergies    Ativan [lorazepam] and Paxil [paroxetine hcl]    Review of Systems   Review of Systems  Cardiovascular:  Positive for chest pain.  Musculoskeletal:  Positive for back pain.    Physical Exam Updated Vital Signs BP (!) 114/29 (BP Location: Right Arm)   Pulse 64   Temp 97.6 F (36.4 C) (Oral)   Resp 20   SpO2 100%  Physical Exam Vitals and nursing note reviewed.  Constitutional:      Appearance: He is well-developed.  HENT:     Head: Normocephalic and atraumatic.  Cardiovascular:     Rate and Rhythm: Normal rate.  Pulmonary:     Effort: Pulmonary effort is normal. No respiratory distress.  Chest:     Chest wall: Tenderness (right lateral, no rash) present.  Abdominal:     General: There is no distension.  Musculoskeletal:        General: Normal range of motion.     Cervical back: Normal range of motion.  Neurological:     Mental Status: He is alert.     ED Results / Procedures / Treatments   Labs (all labs ordered are listed, but only abnormal results are displayed) Labs Reviewed - No data to display  EKG EKG Interpretation Date/Time:  Monday April 02 2023 15:39:59 EST Ventricular Rate:  96 PR Interval:  142 QRS Duration:  80 QT Interval:  334 QTC Calculation: 421 R Axis:   71  Text Interpretation: Normal sinus rhythm Normal ECG When compared with ECG of 20-Mar-2021 07:12, PREVIOUS ECG IS PRESENT Confirmed by Marily Memos [phone]) on 04/03/2023 4:18:26 AM  Radiology DG Chest 2 View  Result  Date: 04/02/2023 CLINICAL DATA:  Chest pain and shortness of breath. EXAM: CHEST - 2 VIEW COMPARISON:  03/20/2023 FINDINGS: The heart size and mediastinal contours are within normal limits. Both lungs are clear. The visualized skeletal structures are unremarkable. IMPRESSION: Normal exam. Electronically Signed   By: Danae Orleans M.D.   On: 04/02/2023 18:56    Procedures Procedures    Medications Ordered in ED Medications  ketorolac (TORADOL) injection 30 mg (has no administration in time range)    ED Course/ Medical Decision Making/ A&P                                 Medical Decision Making Amount and/or Complexity of Data Reviewed Radiology: ordered.  Risk Prescription drug management.   Cxr viewed and interpreted by myself as normal without obvious PTX or rib fx. Suspect other MSK cause. VS WNL making PE unlikely. Ecg reassuring and atypical story, doubt ACS. Will treat with supportive care and pcp follow up if not improving in about 7 days, here if worsening symptoms.   Final Clinical Impression(s) / ED Diagnoses Final diagnoses:  Chest wall pain    Rx / DC Orders ED Discharge Orders          Ordered    meloxicam (MOBIC) 15 MG tablet  Daily        04/03/23 0437    cyclobenzaprine (FLEXERIL) 10 MG tablet  2 times daily PRN        04/03/23 0437              Jerrica Thorman, Barbara Cower, MD 04/03/23 [phone]
